# Patient Record
Sex: Female | Born: 1965 | Race: White | Hispanic: No | Marital: Married | Smoking: Never smoker
Health system: Southern US, Community
[De-identification: ages and names within clinical notes are randomized; demographics above are authoritative.]

## PROBLEM LIST (undated history)

## (undated) DIAGNOSIS — E119 Type 2 diabetes mellitus without complications: Secondary | ICD-10-CM

## (undated) DIAGNOSIS — C801 Malignant (primary) neoplasm, unspecified: Secondary | ICD-10-CM

## (undated) DIAGNOSIS — M1990 Unspecified osteoarthritis, unspecified site: Secondary | ICD-10-CM

## (undated) DIAGNOSIS — R202 Paresthesia of skin: Secondary | ICD-10-CM

## (undated) DIAGNOSIS — I10 Essential (primary) hypertension: Secondary | ICD-10-CM

## (undated) DIAGNOSIS — F419 Anxiety disorder, unspecified: Secondary | ICD-10-CM

## (undated) DIAGNOSIS — J45909 Unspecified asthma, uncomplicated: Secondary | ICD-10-CM

## (undated) DIAGNOSIS — K5900 Constipation, unspecified: Secondary | ICD-10-CM

## (undated) DIAGNOSIS — N838 Other noninflammatory disorders of ovary, fallopian tube and broad ligament: Secondary | ICD-10-CM

## (undated) DIAGNOSIS — N9489 Other specified conditions associated with female genital organs and menstrual cycle: Secondary | ICD-10-CM

## (undated) DIAGNOSIS — Z8489 Family history of other specified conditions: Secondary | ICD-10-CM

## (undated) DIAGNOSIS — R590 Localized enlarged lymph nodes: Secondary | ICD-10-CM

## (undated) DIAGNOSIS — N390 Urinary tract infection, site not specified: Secondary | ICD-10-CM

## (undated) DIAGNOSIS — F329 Major depressive disorder, single episode, unspecified: Secondary | ICD-10-CM

## (undated) DIAGNOSIS — R200 Anesthesia of skin: Secondary | ICD-10-CM

## (undated) DIAGNOSIS — G4730 Sleep apnea, unspecified: Secondary | ICD-10-CM

## (undated) DIAGNOSIS — F32A Depression, unspecified: Secondary | ICD-10-CM

## (undated) HISTORY — PX: CARDIAC CATHETERIZATION: SHX172

## (undated) HISTORY — DX: Essential (primary) hypertension: I10

## (undated) HISTORY — DX: Unspecified osteoarthritis, unspecified site: M19.90

## (undated) HISTORY — DX: Localized enlarged lymph nodes: R59.0

## (undated) HISTORY — DX: Other noninflammatory disorders of ovary, fallopian tube and broad ligament: N83.8

## (undated) HISTORY — DX: Type 2 diabetes mellitus without complications: E11.9

## (undated) HISTORY — PX: TONSILLECTOMY: SUR1361

## (undated) HISTORY — DX: Unspecified asthma, uncomplicated: J45.909

## (undated) HISTORY — DX: Depression, unspecified: F32.A

## (undated) HISTORY — DX: Major depressive disorder, single episode, unspecified: F32.9

## (undated) HISTORY — PX: TUBAL LIGATION: SHX77

---

## 2015-07-28 ENCOUNTER — Encounter: Payer: Self-pay | Admitting: Gynecologic Oncology

## 2015-07-28 ENCOUNTER — Ambulatory Visit: Payer: BLUE CROSS/BLUE SHIELD | Attending: Gynecologic Oncology | Admitting: Gynecologic Oncology

## 2015-07-28 VITALS — BP 157/98 | HR 112 | Temp 98.1°F | Resp 19 | Ht 64.0 in | Wt 258.5 lb

## 2015-07-28 DIAGNOSIS — J4520 Mild intermittent asthma, uncomplicated: Secondary | ICD-10-CM | POA: Diagnosis not present

## 2015-07-28 DIAGNOSIS — N838 Other noninflammatory disorders of ovary, fallopian tube and broad ligament: Secondary | ICD-10-CM

## 2015-07-28 DIAGNOSIS — J45909 Unspecified asthma, uncomplicated: Secondary | ICD-10-CM | POA: Insufficient documentation

## 2015-07-28 DIAGNOSIS — N839 Noninflammatory disorder of ovary, fallopian tube and broad ligament, unspecified: Secondary | ICD-10-CM | POA: Insufficient documentation

## 2015-07-28 DIAGNOSIS — G4733 Obstructive sleep apnea (adult) (pediatric): Secondary | ICD-10-CM

## 2015-07-28 DIAGNOSIS — I10 Essential (primary) hypertension: Secondary | ICD-10-CM | POA: Insufficient documentation

## 2015-07-28 DIAGNOSIS — E119 Type 2 diabetes mellitus without complications: Secondary | ICD-10-CM

## 2015-07-28 DIAGNOSIS — R0602 Shortness of breath: Secondary | ICD-10-CM

## 2015-07-28 DIAGNOSIS — Z6841 Body Mass Index (BMI) 40.0 and over, adult: Secondary | ICD-10-CM | POA: Insufficient documentation

## 2015-07-28 DIAGNOSIS — R591 Generalized enlarged lymph nodes: Secondary | ICD-10-CM

## 2015-07-28 DIAGNOSIS — N83202 Unspecified ovarian cyst, left side: Secondary | ICD-10-CM

## 2015-07-28 DIAGNOSIS — R971 Elevated cancer antigen 125 [CA 125]: Secondary | ICD-10-CM

## 2015-07-28 DIAGNOSIS — N83209 Unspecified ovarian cyst, unspecified side: Secondary | ICD-10-CM

## 2015-07-28 HISTORY — DX: Obstructive sleep apnea (adult) (pediatric): G47.33

## 2015-07-28 HISTORY — DX: Type 2 diabetes mellitus without complications: E11.9

## 2015-07-28 HISTORY — DX: Elevated cancer antigen 125 (CA 125): R97.1

## 2015-07-28 HISTORY — DX: Body Mass Index (BMI) 40.0 and over, adult: Z684

## 2015-07-28 HISTORY — DX: Morbid (severe) obesity due to excess calories: E66.01

## 2015-07-28 HISTORY — DX: Essential (primary) hypertension: I10

## 2015-07-28 HISTORY — DX: Other noninflammatory disorders of ovary, fallopian tube and broad ligament: N83.8

## 2015-07-28 HISTORY — DX: Shortness of breath: R06.02

## 2015-07-28 MED ORDER — TRAMADOL HCL 50 MG PO TABS: 50.0000 mg | ORAL_TABLET | Freq: Four times a day (QID) | ORAL | Status: DC | PRN

## 2015-07-28 NOTE — Progress Notes (Signed)
Consult Note: Gyn-Onc  Consult was requested by Dr. Carlena Bjornstad for the evaluation of Melissa Richards 49 y.o. female with a large left ovarian mass, elevated CA 125 and upper abdominal adenopathy  CC:  Chief Complaint  Patient presents with  . Cystic Ovarian Mass    New Consultation  . Elevated CA125    Assessment/Plan:  Ms. [name]  is a 49 y.o.  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 She has a large left ovarian mass, elevated CA 125 ( 2782) and adenopathy in the portal circulation concerning for metastatic disease. I personally reviewed her films from the CT scan from [name] on 07/17/15.  I discussed my suspicions for malignancy with the patient and her family. I discussed that we should obtain imaging of her chest to rule out stage for, unresectable disease. I discussed that I was concerned that the lesion (4cm) at the portocaval nodal region is concerning unresectable metastatic disease and the nodules incompletely characterized on the CT abdo/pelvis may represent additional unresectable diseasee.  I discussed that the treatment approach for this disease a combination of cytoreductive surgery and chemotherapy. I discussed that sequencing of this can be either with upfront debulking followed by adjuvant chemotherapy sequentially or neoadjuvant chemotherapy followed by an interval cytoreductive attempt, then additional chemotherapy. This latter approach is associated with a reduced perioperative morbidity at the time of surgery. I discussed that the goal of optimal sequencing is to optimise the likelihood that cytoreductive effect can be optimal to less than 1 cm of residual disease, and would not induce morbidity for the patient that would result in a delay of adjuvant chemotherapy. I discussed that it is an individual decision process that takes into account individual patient health, and preference factors, in addition to the apparent  tumor distribution on imaging. I discussed that the overall survival observed in patients is equivalent for both approaches provided that there is an optimal cytoreductive effort at the time of surgery (regardless of the timing of that surgery).  I discussed that surgical debulking efforts are associated with high rates of morbidity and potential need for GI and GU surgery, ostomy formation, blood transfusion, and major wound complications. I discussed that she is at increased risks for these morbidities and complications due to her underlying morbid obesity (BMI 44kg/m2), diabetes mellitus, and obstructive sleep apnea.  The patient strongly prefers for primary surgical effort, understanding that there may not be optimal cytoreduction achieved if the portahepatis node is unresectable. However, given the large size of her left ovarian lesion, and the slightly atypical nature of her disease distribution, confirming histology and removing the large abdominal mass may be of benefit prior to starting chemotherapy. We will review her chest CT and if significant chest disease is appreciated, will reconsider our sequencing of therapy.  She was offered surgery (ex lap, TAH, BSO, omentectomy, lymphadenectomy, cytoreductive surgery) in mid November at [facility] but has elected for a surgical date sooner at [facility] with Dr Cindie Laroche. I discussed with the patient that he will consider her case and determine if primary surgery is appropriate in his estimation.   HPI: Melissa Richards is a 49 year old G2P2 who is seen in consultation at the request of Dr Carlena Bjornstad for a large left ovarian mass and elevated CA 125.  The patient reports a 2 month history of increasing left sided abdominal discomfort and constipation.  She presented to the [facility] ER on 07/17/15 with  symptoms of pelvic swelling, left lower back and abdominal pain. A CT of the abdomen and pelvis on 07/17/15 showed Bibasilar  nodlarity , a subcarinal 1.4cm node suggestive of infrahilar adenopathy, multiple small retroperitoneal nodes (none pathologic by size criteria) including a 84mm aortocaval node and a 1.5cm gastrohepatic ligament node. There is a portacaval node measuring 2.5 x 3.7cm (image/series 21/2) and a 1.2cm right external iliac node.  There is soft tissue fullness extending superiorally off the right ovary (measuring 6x10cm) and a dominant left ovarian cystic mass arising from the pelvis measuring 18 x 22.5cm.  A CA 125 was drawn on 1019/16 and was elevated at 2782. (CEA was normal at 0.4).  She denies GI symptoms. She has never had a colonoscopy or EGD.  The patient has no family history concerning for hereditary cancer predisposition.  She has a medical history significant for morbid obesity, OSA, asthma and non-cardiac shortness of breath. She underwent coronary catheterization 2 years ago for this, which was negative for blockage, and she did not require stenting, or antiplatelet therapy. She was referred to pulmonology who diagnosed her as having asthma. She is a diabetic.   Current Meds:  Outpatient Encounter Prescriptions as of 07/28/2015  Medication Sig  . traMADol (ULTRAM) 50 MG tablet Take 1 tablet (50 mg total) by mouth every 6 (six) hours as needed.   No facility-administered encounter medications on file as of 07/28/2015.    Allergy:  Allergies  Allergen Reactions  . Hydrocodone-Acetaminophen Itching    Social Hx:   Social History   Social History  . Marital Status: Married    Spouse Name: N/A  . Number of Children: N/A  . Years of Education: N/A   Occupational History  . Not on file.   Social History Main Topics  . Smoking status: Never Smoker   . Smokeless tobacco: Never Used  . Alcohol Use: No  . Drug Use: No  . Sexual Activity: Not on file   Other Topics Concern  . Not on file   Social History Narrative  . No narrative on file    Past Surgical Hx: History  reviewed. No pertinent past surgical history.  Past Medical Hx:  Past Medical History  Diagnosis Date  . Arthritis   . Asthma   . Depression   . Diabetes mellitus without complication ([place])   . Hypertension     Past Gynecological History:  SVD x 2 No LMP recorded.  Family Hx:  Family History  Problem Relation Age of Onset  . Anesthesia problems Mother   . Diabetes Mother   . Hypertension Mother   . Cancer Father   . Diabetes Brother     Review of Systems:  Constitutional  Feels generally uncomfortable,    ENT Normal appearing ears and nares bilaterally Skin/Breast  No rash, sores, jaundice, itching, dryness Cardiovascular  No chest pain, shortness of breath, or edema  Pulmonary  No cough or wheeze.  Gastro Intestinal  No nausea, vomitting, or diarrhoea. No bright red blood per rectum,+ abdominal pain, + change in bowel movement, + constipation.  Genito Urinary  No frequency, urgency, dysuria, no post menopausal bleeding Musculo Skeletal  No myalgia, arthralgia, joint swelling or pain  Neurologic  No weakness, numbness, change in gait,  Psychology  No depression, anxiety, insomnia.   Vitals:  Blood pressure 157/98, pulse 112, temperature 98.1 F (36.7 C), temperature source Oral, resp. rate 19, height 5\' 4"  (1.626 m), weight 258 lb 8 oz (117.255 kg), SpO2  100 %.  Physical Exam: WD in NAD Neck  Supple NROM, without any enlargements.  Lymph Node Survey No cervical supraclavicular or inguinal adenopathy Cardiovascular  Pulse normal rate, regularity and rhythm. S1 and S2 normal.  Lungs  Clear to auscultation bilateraly, without wheezes/crackles/rhonchi. Good air movement.  Skin  No rash/lesions/breakdown  Psychiatry  Alert and oriented to person, place, and time  Abdomen  Normoactive bowel sounds, abdomen soft, non-tender and obese with pannus without evidence of hernia. Large cystic fullness extending into upper abdomen. Back No CVA tenderness Genito  Urinary  Vulva/vagina: Normal external female genitalia.  No lesions. No discharge or bleeding.  Bladder/urethra:  No lesions or masses, well supported bladder  Vagina: grossly normal  Cervix: Normal appearing, no lesions.  Uterus: Small, mobile, no parametrial involvement or nodularity.  Adnexa: unable to appreciate masses - she does have a pelvic-abdominal mass that is higher up in the pelvis (not low on the pelvic floor or in approximation with the distal rectum). Rectal  Good tone, no masses no cul de sac nodularity.  Extremities  No bilateral cyanosis, clubbing or edema.   Donaciano Eva, MD  07/28/2015, 3:41 PM

## 2015-07-28 NOTE — Patient Instructions (Addendum)
Surgery with Dr. Cindie Laroche at [facility] on November 3.  Your pre-operative appt will be on Oct 31 at 10:30am.  We will also call you with the results of your CT scan.  Please call for any questions or concerns.  Tramadol tablets What is this medicine? TRAMADOL (TRA ma dole) is a pain reliever. It is used to treat moderate to severe pain in adults. This medicine may be used for other purposes; ask your health care provider or pharmacist if you have questions. What should I tell my health care provider before I take this medicine? They need to know if you have any of these conditions: -brain tumor -depression -drug abuse or addiction -head injury -if you frequently drink alcohol containing drinks -kidney disease or trouble passing urine -liver disease -lung disease, asthma, or breathing problems -seizures or epilepsy -suicidal thoughts, plans, or attempt; a previous suicide attempt by you or a family member -an unusual or allergic reaction to tramadol, codeine, other medicines, foods, dyes, or preservatives -pregnant or trying to get pregnant -breast-feeding How should I use this medicine? Take this medicine by mouth with a full glass of water. Follow the directions on the prescription label. If the medicine upsets your stomach, take it with food or milk. Do not take more medicine than you are told to take. Talk to your pediatrician regarding the use of this medicine in children. Special care may be needed. Overdosage: If you think you have taken too much of this medicine contact a poison control center or emergency room at once. NOTE: This medicine is only for you. Do not share this medicine with others. What if I miss a dose? If you miss a dose, take it as soon as you can. If it is almost time for your next dose, take only that dose. Do not take double or extra doses. What may interact with this medicine? Do not take this medicine with any of the following medications: -MAOIs like Carbex,  Eldepryl, Marplan, Nardil, and Parnate This medicine may also interact with the following medications: -alcohol or medicines that contain alcohol -antihistamines -benzodiazepines -bupropion -carbamazepine or oxcarbazepine -clozapine -cyclobenzaprine -digoxin -furazolidone -linezolid -medicines for depression, anxiety, or psychotic disturbances -medicines for migraine headache like almotriptan, eletriptan, frovatriptan, naratriptan, rizatriptan, sumatriptan, zolmitriptan -medicines for pain like pentazocine, buprenorphine, butorphanol, meperidine, nalbuphine, and propoxyphene -medicines for sleep -muscle relaxants -naltrexone -phenobarbital -phenothiazines like perphenazine, thioridazine, chlorpromazine, mesoridazine, fluphenazine, prochlorperazine, promazine, and trifluoperazine -procarbazine -warfarin This list may not describe all possible interactions. Give your health care provider a list of all the medicines, herbs, non-prescription drugs, or dietary supplements you use. Also tell them if you smoke, drink alcohol, or use illegal drugs. Some items may interact with your medicine. What should I watch for while using this medicine? Tell your doctor or health care professional if your pain does not go away, if it gets worse, or if you have new or a different type of pain. You may develop tolerance to the medicine. Tolerance means that you will need a higher dose of the medicine for pain relief. Tolerance is normal and is expected if you take this medicine for a long time. Do not suddenly stop taking your medicine because you may develop a severe reaction. Your body becomes used to the medicine. This does NOT mean you are addicted. Addiction is a behavior related to getting and using a drug for a non-medical reason. If you have pain, you have a medical reason to take pain medicine. Your doctor will  tell you how much medicine to take. If your doctor wants you to stop the medicine, the dose  will be slowly lowered over time to avoid any side effects. You may get drowsy or dizzy. Do not drive, use machinery, or do anything that needs mental alertness until you know how this medicine affects you. Do not stand or sit up quickly, especially if you are an older patient. This reduces the risk of dizzy or fainting spells. Alcohol can increase or decrease the effects of this medicine. Avoid alcoholic drinks. You may have constipation. Try to have a bowel movement at least every 2 to 3 days. If you do not have a bowel movement for 3 days, call your doctor or health care professional. Your mouth may get dry. Chewing sugarless gum or sucking hard candy, and drinking plenty of water may help. Contact your doctor if the problem does not go away or is severe. What side effects may I notice from receiving this medicine? Side effects that you should report to your doctor or health care professional as soon as possible: -allergic reactions like skin rash, itching or hives, swelling of the face, lips, or tongue -breathing difficulties, wheezing -confusion -itching -light headedness or fainting spells -redness, blistering, peeling or loosening of the skin, including inside the mouth -seizures Side effects that usually do not require medical attention (report to your doctor or health care professional if they continue or are bothersome): -constipation -dizziness -drowsiness -headache -nausea, vomiting This list may not describe all possible side effects. Call your doctor for medical advice about side effects. You may report side effects to FDA at 1-800-FDA-1088. Where should I keep my medicine? Keep out of the reach of children. This medicine may cause accidental overdose and death if it taken by other adults, children, or pets. Mix any unused medicine with a substance like cat litter or coffee grounds. Then throw the medicine away in a sealed container like a sealed bag or a coffee can with a lid. Do  not use the medicine after the expiration date. Store at room temperature between 15 and 30 degrees C (59 and 86 degrees F). NOTE: This sheet is a summary. It may not cover all possible information. If you have questions about this medicine, talk to your doctor, pharmacist, or health care provider.    2016, Elsevier/Gold Standard. (2013-11-16 15:42:09)

## 2015-07-28 NOTE — Progress Notes (Signed)
New patient consultation today for Cystic Ovarian Mass , attempted to discussed current oral medications with the patient and family. Patient and family are poor historians , unable to verbalize current oral medications. Went over oral medications from Dr C Richardson's referral information . Patient did not know most doses or frequency of current oral medications , when asked the patient just stated yes I'm taking that .

## 2015-07-29 ENCOUNTER — Telehealth: Payer: Self-pay | Admitting: *Deleted

## 2015-07-29 NOTE — Telephone Encounter (Signed)
Patient scheduled for surgery at [facility] with Dr. Clarene Essex on 08/07/15. Patient records faxed to [place] at [phone].

## 2015-07-30 ENCOUNTER — Ambulatory Visit (HOSPITAL_COMMUNITY)
Admission: RE | Admit: 2015-07-30 | Discharge: 2015-07-30 | Disposition: A | Discharge status: Home / Self Care | Payer: BLUE CROSS/BLUE SHIELD | Source: Ambulatory Visit | Attending: Gynecologic Oncology | Admitting: Gynecologic Oncology

## 2015-07-30 ENCOUNTER — Encounter (HOSPITAL_COMMUNITY): Payer: Self-pay

## 2015-07-30 DIAGNOSIS — E049 Nontoxic goiter, unspecified: Secondary | ICD-10-CM | POA: Insufficient documentation

## 2015-07-30 DIAGNOSIS — R590 Localized enlarged lymph nodes: Secondary | ICD-10-CM | POA: Insufficient documentation

## 2015-07-30 DIAGNOSIS — N83209 Unspecified ovarian cyst, unspecified side: Secondary | ICD-10-CM

## 2015-07-30 DIAGNOSIS — C569 Malignant neoplasm of unspecified ovary: Secondary | ICD-10-CM | POA: Diagnosis not present

## 2015-07-30 DIAGNOSIS — R971 Elevated cancer antigen 125 [CA 125]: Secondary | ICD-10-CM | POA: Insufficient documentation

## 2015-07-30 DIAGNOSIS — R918 Other nonspecific abnormal finding of lung field: Secondary | ICD-10-CM | POA: Insufficient documentation

## 2015-07-30 LAB — POCT I-STAT CREATININE: Creatinine, Ser: 0.8 mg/dL (ref 0.44–1.00)

## 2015-07-30 MED ORDER — IOHEXOL 300 MG/ML  SOLN
75.0000 mL | Freq: Once | INTRAMUSCULAR | Status: AC | PRN
  Administered 2015-07-30: 75 mL via INTRAVENOUS

## 2015-07-30 NOTE — Telephone Encounter (Signed)
Copy of chest CT scan faxed to [facility] at [facility].

## 2015-07-31 ENCOUNTER — Telehealth: Payer: Self-pay | Admitting: Gynecologic Oncology

## 2015-07-31 NOTE — Telephone Encounter (Signed)
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  It is unlikely that Dr Clarene Essex will proceed with primary debulking. I am awaiting his review of the CT chest to confirm his plan.  If he agrees that neoadjuvant chemotherapy is best, we will arrange for an [name] or CT guided biopsy of the left subclavicular node and an appointment with Dr Marko Plume to commence carboplatin and paclitaxel chemotherapy x 3 cycles.  Donaciano Eva, MD

## 2015-08-01 ENCOUNTER — Telehealth: Payer: Self-pay | Admitting: Gynecologic Oncology

## 2015-08-01 ENCOUNTER — Telehealth: Payer: Self-pay | Admitting: *Deleted

## 2015-08-01 DIAGNOSIS — N838 Other noninflammatory disorders of ovary, fallopian tube and broad ligament: Secondary | ICD-10-CM

## 2015-08-01 DIAGNOSIS — R590 Localized enlarged lymph nodes: Secondary | ICD-10-CM

## 2015-08-01 NOTE — Telephone Encounter (Signed)
Called and spoke with patient about Dr. Serita Grit recommendations.  Advised that [name] had consulted with Dr. Clarene Essex at [facility] and they feel she needs to begin with neoadjuvant chemotherapy.  Patient verbalizing understanding.  Advised that she would be set up to have an ultrasound guided biopsy of the left supraclavicular lymph node to confirm the diagnosis and she would also be set up to meet with Dr. Marko Plume.  Message left for Katharine Look at [place] her that surgery would need to be cancelled at this time at [facility].  Patient advised that she would be receiving a phone call our office and the hospital about her biopsy.  Advised to call for any questions or concerns.

## 2015-08-01 NOTE — Telephone Encounter (Signed)
Called patient to notify her that the US biopsy is not yet scheduled. Told patient that we will follow-up with her on Monday, 08/04/15. No other concerns noted at this time.

## 2015-08-04 ENCOUNTER — Other Ambulatory Visit: Payer: Self-pay | Admitting: Radiology

## 2015-08-05 ENCOUNTER — Encounter (HOSPITAL_COMMUNITY): Payer: Self-pay

## 2015-08-05 ENCOUNTER — Ambulatory Visit (HOSPITAL_COMMUNITY)
Admission: RE | Admit: 2015-08-05 | Discharge: 2015-08-05 | Disposition: A | Discharge status: Home / Self Care | Payer: BLUE CROSS/BLUE SHIELD | Source: Ambulatory Visit | Attending: Gynecologic Oncology | Admitting: Gynecologic Oncology

## 2015-08-05 ENCOUNTER — Telehealth: Payer: Self-pay

## 2015-08-05 ENCOUNTER — Telehealth: Payer: Self-pay | Admitting: *Deleted

## 2015-08-05 DIAGNOSIS — G4733 Obstructive sleep apnea (adult) (pediatric): Secondary | ICD-10-CM | POA: Insufficient documentation

## 2015-08-05 DIAGNOSIS — E119 Type 2 diabetes mellitus without complications: Secondary | ICD-10-CM | POA: Insufficient documentation

## 2015-08-05 DIAGNOSIS — R590 Localized enlarged lymph nodes: Secondary | ICD-10-CM | POA: Insufficient documentation

## 2015-08-05 DIAGNOSIS — Z8249 Family history of ischemic heart disease and other diseases of the circulatory system: Secondary | ICD-10-CM | POA: Insufficient documentation

## 2015-08-05 DIAGNOSIS — J45909 Unspecified asthma, uncomplicated: Secondary | ICD-10-CM | POA: Insufficient documentation

## 2015-08-05 DIAGNOSIS — Z79899 Other long term (current) drug therapy: Secondary | ICD-10-CM | POA: Insufficient documentation

## 2015-08-05 DIAGNOSIS — R971 Elevated cancer antigen 125 [CA 125]: Secondary | ICD-10-CM | POA: Insufficient documentation

## 2015-08-05 DIAGNOSIS — Z7984 Long term (current) use of oral hypoglycemic drugs: Secondary | ICD-10-CM | POA: Diagnosis not present

## 2015-08-05 DIAGNOSIS — K5900 Constipation, unspecified: Secondary | ICD-10-CM | POA: Insufficient documentation

## 2015-08-05 DIAGNOSIS — Z833 Family history of diabetes mellitus: Secondary | ICD-10-CM | POA: Diagnosis not present

## 2015-08-05 DIAGNOSIS — I10 Essential (primary) hypertension: Secondary | ICD-10-CM | POA: Insufficient documentation

## 2015-08-05 DIAGNOSIS — N839 Noninflammatory disorder of ovary, fallopian tube and broad ligament, unspecified: Secondary | ICD-10-CM | POA: Insufficient documentation

## 2015-08-05 DIAGNOSIS — N838 Other noninflammatory disorders of ovary, fallopian tube and broad ligament: Secondary | ICD-10-CM | POA: Insufficient documentation

## 2015-08-05 DIAGNOSIS — F329 Major depressive disorder, single episode, unspecified: Secondary | ICD-10-CM | POA: Insufficient documentation

## 2015-08-05 LAB — CBC
HCT: 38.1 % (ref 36.0–46.0)
Hemoglobin: 12.1 g/dL (ref 12.0–15.0)
MCH: 26.8 pg (ref 26.0–34.0)
MCHC: 31.8 g/dL (ref 30.0–36.0)
MCV: 84.3 fL (ref 78.0–100.0)
Platelets: 349 10*3/uL (ref 150–400)
RBC: 4.52 MIL/uL (ref 3.87–5.11)
RDW: 13.4 % (ref 11.5–15.5)
WBC: 10.9 10*3/uL — ABNORMAL HIGH (ref 4.0–10.5)

## 2015-08-05 LAB — GLUCOSE, CAPILLARY: Glucose-Capillary: 125 mg/dL — ABNORMAL HIGH (ref 65–99)

## 2015-08-05 LAB — APTT: aPTT: 29 seconds (ref 24–37)

## 2015-08-05 LAB — PROTIME-INR
INR: 1.09 (ref 0.00–1.49)
Prothrombin Time: 14.3 seconds (ref 11.6–15.2)

## 2015-08-05 MED ORDER — SODIUM CHLORIDE 0.9 % IV SOLN: INTRAVENOUS | Status: DC

## 2015-08-05 MED ORDER — FENTANYL CITRATE (PF) 100 MCG/2ML IJ SOLN
INTRAMUSCULAR | Status: AC | PRN
  Administered 2015-08-05: 50 ug via INTRAVENOUS
  Administered 2015-08-05 (×2): 25 ug via INTRAVENOUS

## 2015-08-05 MED ORDER — FLUMAZENIL 0.5 MG/5ML IV SOLN
INTRAVENOUS | Status: AC
  Filled 2015-08-05: qty 5

## 2015-08-05 MED ORDER — NALOXONE HCL 0.4 MG/ML IJ SOLN
INTRAMUSCULAR | Status: AC
  Filled 2015-08-05: qty 1

## 2015-08-05 MED ORDER — FENTANYL CITRATE (PF) 100 MCG/2ML IJ SOLN
INTRAMUSCULAR | Status: AC
  Filled 2015-08-05: qty 2

## 2015-08-05 MED ORDER — MIDAZOLAM HCL 2 MG/2ML IJ SOLN
INTRAMUSCULAR | Status: AC | PRN
  Administered 2015-08-05: 1 mg via INTRAVENOUS
  Administered 2015-08-05 (×2): 0.5 mg via INTRAVENOUS

## 2015-08-05 MED ORDER — MIDAZOLAM HCL 2 MG/2ML IJ SOLN
INTRAMUSCULAR | Status: AC
  Filled 2015-08-05: qty 4

## 2015-08-05 NOTE — Telephone Encounter (Signed)
Incoming call , patient states she is having problems sleeping and requested some thing for sleep. Melissa Cross , APNP updated, review of medications indicated the patient is currently taking Trazodone 50 MG PO every night for sleep. Orders received to have the patient contact her PCP and request a possible increase in the dose of her Trazodone . Patient agreed with NP recommendations , patient denies further questions at this time .

## 2015-08-05 NOTE — Telephone Encounter (Signed)
Message left on patient's Voicemail with appointment details.

## 2015-08-05 NOTE — Procedures (Signed)
[name] FNA L supraclav LAN 07M x3 No complication No blood loss. See complete dictation in [facility].

## 2015-08-05 NOTE — Discharge Instructions (Signed)
Needle Biopsy, Care After Refer to this sheet in the next few weeks. These instructions provide you with information about caring for yourself after your procedure. Your health care provider may also give you more specific instructions. Your treatment has been planned according to current medical practices, but problems sometimes occur. Call your health care provider if you have any problems or questions after your procedure. WHAT TO EXPECT AFTER THE PROCEDURE After your procedure, it is common to have soreness, bruising, or mild pain at the biopsy site. This should go away in a few days. HOME CARE INSTRUCTIONS  Rest as directed by your health care provider.  Take medicines only as directed by your health care provider.  There are many different ways to close and cover the biopsy site, including stitches (sutures), skin glue, and adhesive strips. Follow your health care provider's instructions about:  Biopsy site care.  Bandage (dressing) changes and removal.  Biopsy site closure removal.  Check your biopsy site every day for signs of infection. Watch for:  Redness, swelling, or pain.  Fluid, blood, or pus. SEEK MEDICAL CARE IF:  You have a fever.  You have redness, swelling, or pain at the biopsy site that lasts longer than a few days.  You have fluid, blood, or pus coming from the biopsy site.  You feel nauseous.  You vomit. SEEK IMMEDIATE MEDICAL CARE IF:  You have shortness of breath.  You have trouble breathing.  You have chest pain.   You feel dizzy or you faint.  You have bleeding that does not stop with pressure or a bandage.  You cough up blood.  You have pain in your abdomen.   This information is not intended to replace advice given to you by your health care provider. Make sure you discuss any questions you have with your health care provider.   Document Released: 02/04/2015 Document Reviewed: 02/04/2015 Elsevier Interactive Patient Education 2016  Elsevier Inc. Moderate Conscious Sedation, Adult Sedation is the use of medicines to promote relaxation and relieve discomfort and anxiety. Moderate conscious sedation is a type of sedation. Under moderate conscious sedation you are less alert than normal but are still able to respond to instructions or stimulation. Moderate conscious sedation is used during short medical and dental procedures. It is milder than deep sedation or general anesthesia and allows you to return to your regular activities sooner. LET [facility] CARE PROVIDER KNOW ABOUT:   Any allergies you have.  All medicines you are taking, including vitamins, herbs, eye drops, creams, and over-the-counter medicines.  Use of steroids (by mouth or creams).  Previous problems you or members of your family have had with the use of anesthetics.  Any blood disorders you have.  Previous surgeries you have had.  Medical conditions you have.  Possibility of pregnancy, if this applies.  Use of cigarettes, alcohol, or illegal drugs. RISKS AND COMPLICATIONS Generally, this is a safe procedure. However, as with any procedure, problems can occur. Possible problems include:  Oversedation.  Trouble breathing on your own. You may need to have a breathing tube until you are awake and breathing on your own.  Allergic reaction to any of the medicines used for the procedure. BEFORE THE PROCEDURE  You may have blood tests done. These tests can help show how well your kidneys and liver are working. They can also show how well your blood clots.  A physical exam will be done.  Only take medicines as directed by your health care provider.  You may need to stop taking medicines (such as blood thinners, aspirin, or nonsteroidal anti-inflammatory drugs) before the procedure.   Do not eat or drink at least 6 hours before the procedure or as directed by your health care provider.  Arrange for a responsible adult, family member, or friend to  take you home after the procedure. He or she should stay with you for at least 24 hours after the procedure, until the medicine has worn off. PROCEDURE   An intravenous (IV) catheter will be inserted into one of your veins. Medicine will be able to flow directly into your body through this catheter. You may be given medicine through this tube to help prevent pain and help you relax.  The medical or dental procedure will be done. AFTER THE PROCEDURE  You will stay in a recovery area until the medicine has worn off. Your blood pressure and pulse will be checked.   Depending on the procedure you had, you may be allowed to go home when you can tolerate liquids and your pain is under control.   This information is not intended to replace advice given to you by your health care provider. Make sure you discuss any questions you have with your health care provider.   Document Released: 06/15/2001 Document Revised: 10/11/2014 Document Reviewed: 05/28/2013 Elsevier Interactive Patient Education [employer].

## 2015-08-05 NOTE — H&P (Signed)
Chief Complaint: Patient was seen in consultation today for US guided left supraclavicular lymph node biopsy  Referring Physician(s): Cross,Melissa D/Rossi  History of Present Illness: Melissa Richards is a 49 y.o. female with past medical history significant for morbid obesity, obstructive sleep apnea, asthma, diabetes, hypertension, an approximately 2 month history of increasing left-sided abdominal discomfort and constipation. Subsequent imaging studies have revealed findings concerning for metastatic ovarian cancer with a large left ovarian mass with associated adenopathy and bilateral small pulmonary nodules. Patient's CA 125 is 2782. She presents today for ultrasound-guided left supra clavicular lymph node biopsy for further evaluation.  Past Medical History  Diagnosis Date  . Arthritis   . Asthma   . Depression   . Diabetes mellitus without complication ([place])   . Hypertension     Past Surgical History  Procedure Laterality Date  . Tubal ligation    . Tonsillectomy    . Cardiac catheterization      2 years ago    Allergies: Hydrocodone-acetaminophen  Medications: Prior to Admission medications   Medication Sig Start Date End Date Taking? Authorizing Provider  AMLODIPINE BESYLATE PO Take by mouth.   Yes Historical Provider, MD  atenolol (TENORMIN) 25 MG tablet Take 25 mg by mouth daily.   Yes Historical Provider, MD  budesonide-formoterol (SYMBICORT) 160-4.5 MCG/ACT inhaler Inhale 2 puffs into the lungs 2 (two) times daily.   Yes Historical Provider, MD  busPIRone (BUSPAR) 15 MG tablet Take 15 mg by mouth 2 (two) times daily.   Yes Historical Provider, MD  citalopram (CELEXA) 40 MG tablet Take 40 mg by mouth daily.   Yes Historical Provider, MD  lisinopril (PRINIVIL,ZESTRIL) 20 MG tablet Take 20 mg by mouth daily.   Yes Historical Provider, MD  metFORMIN (GLUCOPHAGE) 500 MG tablet Take by mouth daily with breakfast.   Yes Historical Provider, MD  traMADol (ULTRAM) 50  MG tablet Take 1 tablet (50 mg total) by mouth every 6 (six) hours as needed. 07/28/15  Yes [name], MD  traZODone (DESYREL) 50 MG tablet Take 50 mg by mouth at bedtime.   Yes Historical Provider, MD  atorvastatin (LIPITOR) 40 MG tablet Take 40 mg by mouth daily.    Historical Provider, MD     Family History  Problem Relation Age of Onset  . Anesthesia problems Mother   . Diabetes Mother   . Hypertension Mother   . Cancer Father   . Diabetes Brother     Social History   Social History  . Marital Status: Married    Spouse Name: N/A  . Number of Children: N/A  . Years of Education: N/A   Social History Main Topics  . Smoking status: Never Smoker   . Smokeless tobacco: Never Used  . Alcohol Use: No  . Drug Use: No  . Sexual Activity: Not Asked   Other Topics Concern  . None   Social History Narrative     Review of Systems  Constitutional: Negative for fever and chills.  Respiratory: Positive for shortness of breath. Negative for cough.   Cardiovascular: Negative for chest pain.  Gastrointestinal: Positive for abdominal pain and abdominal distention. Negative for vomiting and blood in stool.       Occasional nausea  Genitourinary: Negative for dysuria and hematuria.  Musculoskeletal: Positive for back pain.  Neurological: Negative for headaches.  Psychiatric/Behavioral: The patient is nervous/anxious.     Vital Signs: BP 120/85 mmHg  Pulse 87  Temp(Src) 98.8 F (37.1 C) (Oral)  Resp 18  Ht 5\' 2"  (1.575 m)  Wt 254 lb (115.214 kg)  BMI 46.45 kg/m2  SpO2 97%  Physical Exam  Constitutional: She is oriented to person, place, and time. She appears well-developed and well-nourished.  Cardiovascular: Normal rate and regular rhythm.   Pulmonary/Chest: Effort normal and breath sounds normal.  Abdominal: Soft. Bowel sounds are normal. She exhibits distension. There is tenderness.  Obese  Musculoskeletal: Normal range of motion. She exhibits no edema.    Neurological: She is alert and oriented to person, place, and time.    Mallampati Score:     Imaging: Ct Chest W Contrast  07/30/2015  CLINICAL DATA:  Staging for newly diagnosed ovarian carcinoma. Elevated CA 125 marker. Initial encounter EXAM: CT CHEST WITH CONTRAST TECHNIQUE: Multidetector CT imaging of the chest was performed during intravenous contrast administration. CONTRAST:  51mL OMNIPAQUE IOHEXOL 300 MG/ML  SOLN COMPARISON:  CT abdomen pelvis 07/21/2015 FINDINGS: Mediastinum/Nodes: 9 mm LEFT supraclavicular lymph node (image 6, series 2). This lymph node is best depicted on coronal image 63. There is enlargement of the LEFT lobe of thyroid gland 231 x 35 mm with coarse internal calcifications. Enlarged RIGHT lower paratracheal lymph node measures 16 mm short axis. Enlarged RIGHT hilar lymph node measures 17 mm. A prominent LEFT hilar lymph nodes measure up to 9 mm. No central pulmonary embolism. Lungs/Pleura: Review of the lung parenchyma demonstrates bilateral small pulmonary nodules with indistinct margins. Example nodules RIGHT lower lobe measures 5 mm on image 33, series 5. RIGHT upper lobe nodule measures 4 mm image 17 series 5 RIGHT middle lobe nodule measures 5 mm image 21 Several nodules are dispersed along the horizontal oblique fissure (image 28) Within the LEFT lower lobe 5 mm nodule image 30 5 mm nodule within the lingula on image 26. Upper abdomen: Limited view of the liver, kidneys, pancreas are unremarkable. Normal adrenal glands. Musculoskeletal: No aggressive osseous lesion. IMPRESSION: 1. Bilateral small pulmonary nodules are concerning for pulmonary metastasis. 2. Mild mediastinal and bilateral hilar adenopathy is also concerning for metastatic disease. 3. Mildly enlarged LEFT supraclavicular lymph node is concerning for metastatic disease. This lymph node may be amenable to ultrasound-guided biopsy. 4. Enlargement of the LEFT lobe of thyroid gland likely represents benign  goiter. Electronically Signed   By: Suzy Bouchard M.D.   On: 07/30/2015 14:14    Labs:  CBC:  Recent Labs  08/05/15 0840  WBC 10.9*  HGB 12.1  HCT 38.1  PLT 349    COAGS:  Recent Labs  08/05/15 0840  INR 1.09  APTT 29    BMP:  Recent Labs  07/30/15 1241  CREATININE 0.80    LIVER FUNCTION TESTS: No results for input(s): BILITOT, AST, ALT, ALKPHOS, PROT, ALBUMIN in the last 8760 hours.  TUMOR MARKERS: No results for input(s): AFPTM, CEA, CA199, CHROMGRNA in the last 8760 hours.  Assessment and Plan: Patient with approximately 2 month history of abdominal pain, constipation, elevated CA-125 and imaging findings concerning for metastatic ovarian carcinoma. She presents today for ultrasound-guided left supraclavicular lymph node biopsy for further evaluation.Risks and benefits discussed with the patient/husband including, but not limited to bleeding, infection, damage to adjacent structures or low yield requiring additional tests.All of the patient's questions were answered, patient is agreeable to proceed.Consent signed and in chart.     Thank you for this interesting consult.  I greatly enjoyed meeting Melissa Richards and look forward to participating in their care.  A copy of this report was sent  to the requesting provider on this date.  Signed: D. Rowe Robert 08/05/2015, 9:05 AM   I spent a total of 15 minutes in face to face in clinical consultation, greater than 50% of which was counseling/coordinating care for ultrasound-guided left supraclavicular lymph node biopsy

## 2015-08-07 ENCOUNTER — Other Ambulatory Visit (HOSPITAL_BASED_OUTPATIENT_CLINIC_OR_DEPARTMENT_OTHER): Payer: BLUE CROSS/BLUE SHIELD

## 2015-08-07 ENCOUNTER — Ambulatory Visit (HOSPITAL_COMMUNITY): Payer: BLUE CROSS/BLUE SHIELD

## 2015-08-07 ENCOUNTER — Encounter: Payer: Self-pay | Admitting: *Deleted

## 2015-08-07 ENCOUNTER — Other Ambulatory Visit: Payer: Self-pay | Admitting: Oncology

## 2015-08-07 ENCOUNTER — Other Ambulatory Visit: Payer: BLUE CROSS/BLUE SHIELD

## 2015-08-07 DIAGNOSIS — E119 Type 2 diabetes mellitus without complications: Secondary | ICD-10-CM | POA: Diagnosis not present

## 2015-08-07 DIAGNOSIS — R971 Elevated cancer antigen 125 [CA 125]: Secondary | ICD-10-CM | POA: Diagnosis not present

## 2015-08-07 DIAGNOSIS — N839 Noninflammatory disorder of ovary, fallopian tube and broad ligament, unspecified: Secondary | ICD-10-CM

## 2015-08-07 DIAGNOSIS — N838 Other noninflammatory disorders of ovary, fallopian tube and broad ligament: Secondary | ICD-10-CM

## 2015-08-07 LAB — CBC WITH DIFFERENTIAL/PLATELET
BASO%: 1 % (ref 0.0–2.0)
Basophils Absolute: 0.1 10*3/uL (ref 0.0–0.1)
EOS ABS: 0.2 10*3/uL (ref 0.0–0.5)
EOS%: 1.6 % (ref 0.0–7.0)
HEMATOCRIT: 38.4 % (ref 34.8–46.6)
HGB: 12.3 g/dL (ref 11.6–15.9)
LYMPH#: 0.7 10*3/uL — AB (ref 0.9–3.3)
LYMPH%: 6.2 % — AB (ref 14.0–49.7)
MCH: 26 pg (ref 25.1–34.0)
MCHC: 32 g/dL (ref 31.5–36.0)
MCV: 81.1 fL (ref 79.5–101.0)
MONO#: 0.8 10*3/uL (ref 0.1–0.9)
MONO%: 7.5 % (ref 0.0–14.0)
NEUT#: 9.4 10*3/uL — ABNORMAL HIGH (ref 1.5–6.5)
NEUT%: 83.7 % — AB (ref 38.4–76.8)
PLATELETS: 411 10*3/uL — AB (ref 145–400)
RBC: 4.74 10*6/uL (ref 3.70–5.45)
RDW: 13.8 % (ref 11.2–14.5)
WBC: 11.2 10*3/uL — ABNORMAL HIGH (ref 3.9–10.3)

## 2015-08-07 LAB — COMPREHENSIVE METABOLIC PANEL (CC13)
ALT: 13 U/L (ref 0–55)
ANION GAP: 10 meq/L (ref 3–11)
AST: 10 U/L (ref 5–34)
Albumin: 3.1 g/dL — ABNORMAL LOW (ref 3.5–5.0)
Alkaline Phosphatase: 90 U/L (ref 40–150)
BUN: 8.5 mg/dL (ref 7.0–26.0)
CALCIUM: 9.8 mg/dL (ref 8.4–10.4)
CHLORIDE: 99 meq/L (ref 98–109)
CO2: 25 meq/L (ref 22–29)
CREATININE: 1 mg/dL (ref 0.6–1.1)
EGFR: 67 mL/min/{1.73_m2} — AB (ref >90)
Glucose: 183 mg/dl — ABNORMAL HIGH (ref 70–140)
Potassium: 4.3 mEq/L (ref 3.5–5.1)
Sodium: 135 mEq/L — ABNORMAL LOW (ref 136–145)
Total Bilirubin: 0.68 mg/dL (ref 0.20–1.20)
Total Protein: 7 g/dL (ref 6.4–8.3)

## 2015-08-08 ENCOUNTER — Telehealth: Payer: Self-pay | Admitting: Gynecologic Oncology

## 2015-08-08 ENCOUNTER — Other Ambulatory Visit: Payer: Self-pay | Admitting: Gynecologic Oncology

## 2015-08-08 DIAGNOSIS — N838 Other noninflammatory disorders of ovary, fallopian tube and broad ligament: Secondary | ICD-10-CM

## 2015-08-08 DIAGNOSIS — R971 Elevated cancer antigen 125 [CA 125]: Secondary | ICD-10-CM

## 2015-08-08 LAB — CA 125: CA 125: 1784 U/mL — AB (ref <35)

## 2015-08-08 NOTE — Telephone Encounter (Addendum)
Error

## 2015-08-08 NOTE — Progress Notes (Signed)
Per [name], probable ovarian ca, need tissue diagnosis confirming ca diagnosis before beginning chemotherapy.  CT biopsy ordered placed to see if the radiologist feels there is another place to biopsy.

## 2015-08-11 ENCOUNTER — Telehealth: Payer: Self-pay | Admitting: Gynecologic Oncology

## 2015-08-11 ENCOUNTER — Other Ambulatory Visit: Payer: Self-pay | Admitting: Oncology

## 2015-08-11 NOTE — Telephone Encounter (Signed)
Left message advising the patient that even though her biopsy is scheduled for Nov 14, she should still come to her appt with Dr. Marko Plume tomorrow per [name] and Dr. Marko Plume.  Advised to call the office for any questions or concerns.

## 2015-08-12 ENCOUNTER — Telehealth: Payer: Self-pay | Admitting: *Deleted

## 2015-08-12 ENCOUNTER — Ambulatory Visit (HOSPITAL_BASED_OUTPATIENT_CLINIC_OR_DEPARTMENT_OTHER): Payer: BLUE CROSS/BLUE SHIELD | Admitting: Oncology

## 2015-08-12 ENCOUNTER — Encounter: Payer: Self-pay | Admitting: Oncology

## 2015-08-12 ENCOUNTER — Telehealth: Payer: Self-pay | Admitting: Oncology

## 2015-08-12 VITALS — BP 136/91 | HR 103 | Temp 97.8°F | Resp 18 | Ht 62.0 in | Wt 254.5 lb

## 2015-08-12 DIAGNOSIS — G4733 Obstructive sleep apnea (adult) (pediatric): Secondary | ICD-10-CM

## 2015-08-12 DIAGNOSIS — J4520 Mild intermittent asthma, uncomplicated: Secondary | ICD-10-CM

## 2015-08-12 DIAGNOSIS — C562 Malignant neoplasm of left ovary: Secondary | ICD-10-CM

## 2015-08-12 DIAGNOSIS — J45909 Unspecified asthma, uncomplicated: Secondary | ICD-10-CM

## 2015-08-12 DIAGNOSIS — Z23 Encounter for immunization: Secondary | ICD-10-CM | POA: Diagnosis not present

## 2015-08-12 DIAGNOSIS — Z6841 Body Mass Index (BMI) 40.0 and over, adult: Secondary | ICD-10-CM

## 2015-08-12 DIAGNOSIS — C801 Malignant (primary) neoplasm, unspecified: Secondary | ICD-10-CM | POA: Diagnosis not present

## 2015-08-12 DIAGNOSIS — N838 Other noninflammatory disorders of ovary, fallopian tube and broad ligament: Secondary | ICD-10-CM

## 2015-08-12 DIAGNOSIS — N839 Noninflammatory disorder of ovary, fallopian tube and broad ligament, unspecified: Secondary | ICD-10-CM | POA: Diagnosis not present

## 2015-08-12 DIAGNOSIS — E119 Type 2 diabetes mellitus without complications: Secondary | ICD-10-CM

## 2015-08-12 HISTORY — DX: Body Mass Index (BMI) 40.0 and over, adult: Z684

## 2015-08-12 HISTORY — DX: Mild intermittent asthma, uncomplicated: J45.20

## 2015-08-12 MED ORDER — LORAZEPAM 1 MG PO TABS: ORAL_TABLET | ORAL | Status: DC

## 2015-08-12 MED ORDER — DEXAMETHASONE 4 MG PO TABS: ORAL_TABLET | ORAL | Status: DC

## 2015-08-12 MED ORDER — ONDANSETRON HCL 8 MG PO TABS: 8.0000 mg | ORAL_TABLET | Freq: Three times a day (TID) | ORAL | Status: DC | PRN

## 2015-08-12 MED ORDER — INFLUENZA VAC SPLIT QUAD 0.5 ML IM SUSY
0.5000 mL | PREFILLED_SYRINGE | Freq: Once | INTRAMUSCULAR | Status: AC
  Administered 2015-08-12: 0.5 mL via INTRAMUSCULAR
  Filled 2015-08-12: qty 0.5

## 2015-08-12 NOTE — Progress Notes (Signed)
[place] NEW PATIENT EVALUATION   Name: Melissa Richards Date: August 12, 2015  MRN: [id number] DOB: 18-Jan-1966  REFERRING PHYSICIAN: [name] / Almyra Deforest, Marshall Cork., MD,  Ellouise Newer, Tanvir Chodri [phone])  REASON FOR REFERRAL: new diagnosis ovarian cancer, for neoadjuvant chemotherapy  Information is from [facility] and this EMR, and has been reviewed in detail with patient and daughter now, with their history additionally.  HISTORY OF PRESENT ILLNESS:Melissa Richards is a 49 y.o. female who is seen in consultation, together with daughter, at the request of Drs Denman George and Clarene Essex , for consideration of neoadjuvant chemotherapy for new clinical diagnosis of ovarian cancer, which may be stage IV disease.    Patient has had intermittent abdominal discomfort x months, with more recent increased constipation and left abdominal discomfort. Symptoms did not improve with miralax, such that she had CT AP at [facility] 07-17-15. The scan showed bibasilar pulmonary nodules; subcarinal node 1.4 cm and question of right infrahilar adenopathy; multiple small retroperitoneal nodes including 7 mm aortocaval, 1.5 cm gastrohepatic, 2.5 x 3.7 portacaval node and 1.2 cm right iliac node; complex cystic mass from left ovary 18.3 x 22.5 x 20.5 cm and right ovarian mass 6x9.6 x 10 cm; small ascites; uterus displaced to left but otherwise normal; some omental thickening. She was seen by Dr Marvel Plan, with CA 125 on 07-23-15 resulting 2782 and CEA 0.4. She was referred to Dr Denman George 07-28-15, with plan initially to go directly to surgery by Dr Clarene Essex at [facility]. She had additional evaluation with CT chest in [facility] system on 07-30-15, with 9 mm left supraclav node, right paratracheal node 16 mm, right hilar node 17 mm, left hilar node 9 mm, bilateral small pulmonary nodules with indistinct margins 4-5 mm, no concerning bony lesions, upper abdomen not remarkable. With CT  findings, plan changed to neoadjuvant chemotherapy rather than initial surgery. She had [name] FNA of left supraclavicular node on 08-05-15 with granulomas in lymphoid tissue, no malignancy. She is scheduled for needle biopsy of right external iliac node on 08-18-15. She attended chemotherapy education class on 08-07-15.  REVIEW OF SYSTEMS: Weight down a little tho abdomen feels more full. Bowels moving "every few days", uses occasional metamucil only. Recent GERD, occasional nausea, occasional vomiting. Abdominal discomfort generally LUQ and left mid abdomen. Bladder ok. No bleeding. No SOB, occasional cough, rarely uses inhalers, does not tolerate CPAP for OSA. No cardiac symptoms. Blood sugars generally ~ 160 tho does not check regularly; is able to check BS at home. Recent mild HA. Good visual acuity with glasses, no sinus symptoms, no difficulty hearing, no dental pain tho broken tooth right upper molar. No known thyroid disease. Symbicort "makes throat sore". On trazadone x >=10 years. On buspar x years. Difficulty sleeping. On oral hypoglycemic only, does not follow diabetic diet well. No chest pain or palpitations. Some arthritis hips and back. No bleeding. No history of blood clots, IV access somewhat difficult.  Remainder of full 10 point review of systems negative.   ALLERGIES: Hydrocodone-acetaminophen causing itching  PAST MEDICAL/ SURGICAL HISTORY:    G2P2 Morbid obesity DM for "a few years" on oral agent Obstructive sleep apnea: evaluated by Dr Alcide Clever with [facility] Asthma Possible pneumonia a year ago with CXR by Dr Alcide Clever then Cardiac cath 2014 negative HTN Mammogram at [facility] within past year Anxiety and depression Never transfused  CURRENT MEDICATIONS: reviewed as listed now in EMR. Flu vaccine done  today  Written and oral instructions: 1.decadron (dexamethasone, steroid) 4 mg. Take five tablets +(=20 mg) with food 12 hrs before taxol  chemotherapy and five tablets with food 6 hrs before taxol   2.zofran (ondansetron) 8mg  One tablet every 8 hrs as needed for nausea. Will not make you drowsy. Fine to take one tablet AM after chemo whether or not any nausea then, to extend coverage for nausea a bit longer. Other than that dose, fine to take just as needed for nausea   3.ativan (lorazepam)  1 mg. 1/2 - 1 tablet swallow or dissolve under tongue every 6 hrs as needed for nausea. WIll make you drowsy and a little forgetful around each dose. Fine to take one tablet at bedtime night of chemo whether or not any nausea. (This will also help anxiety, but best not to take regularly for anxiety.)  Fine to take claritin (lortadine) daily beginning day after chemo for several days, as this may help with taxol aches. OK to use tylenol, aleve with food, heating pad, hot shower, tramadol if taxol aches      PHARMACY  [facility]   SOCIAL HISTORY:  Originally from [place] tho moved to [place] 2 years ago. Lives with husband, daughter with 3 grandsons ages 11-7-4 also in [place] and son in [place]. Patient worked in [employer] (medical), disabled with arthritis and depression. Never smoker. No ETOH.  FAMILY HISTORY:  Mother DM and HTN Father throat ca, smoker Brother DM Daughter lupus [place] crohn's          PHYSICAL EXAM:  height is 5\' 2"  (1.575 m) and weight is 254 lb 8 oz (115.44 kg). Her oral temperature is 97.8 F (36.6 C). Her blood pressure is 136/91 and her pulse is 103. Her respiration is 18 and oxygen saturation is 100%.  Alert, pleasant, cooperative lady, fairly good historian, daughter helpful and supportive. Looks stated age, morbidly obese, respirations not labored RA. Able to get on and off exam table with some assistance. NAD.  HEENT:normal hair pattern. PERRL, not icteric. Oral mucosa moist. Broken upper right molar, no swelling or tenderness. Neck supple, no palpable thyroid mass, no JVD.   RESPIRATORY: lungs clear to A and  P  CARDIAC/ VASCULAR:Heart RRR no gallop, clear heart sounds. Peripheral pulses symmetricall and intact  ABDOMEN: obese, full, few BS, not tender to palpation, no clear organomegaly or mass.  LYMPH NODES:no cervical, supraclavicular, axillary or inguinal adenopathy  BREASTS: bilaterally without dominant mass, skin or nipple findings  NEUROLOGIC: speech fluent and appropriate. CN intact. Motor, sensory, cerebellar nonfocal  SKIN:without rash, ecchymosis, petechiae  MUSCULOSKELETAL: spine not tender. Muscle mass symmetrical. LE no pitting edema, cords, tenderness Veins in UE appear adequate to initiate chemotherapy    LABORATORY DATA:  Labs 08-07-15:  CBC with WBC 11.2, ANC 9.4, Hgb 12.3 and plt 411k CMET with Na 135, K 4.3, CL 99, CO2 25, glu 183, BUN 8.5, creat 1.0, Tbili 0.68, AP 90, AST 10, ALT 13, Tprot 7.0, alb 3.1, ca 9.8, EGFR 67 CA 125   1784  PATHOLOGY: KENNEDI, LIZARDO Collected: 08/05/2015 Client: [facility] Accession: [id number] Received: 08/05/2015 D. Arne Cleveland CYTOPATHOLOGY REPORT Adequacy Reason Satisfactory For Evaluation. Diagnosis LYMPH NODE, FINE NEEDLE ASPIRATION, LEFT SUPRACLAVICULAR (SPECIMEN 1 OF 1 COLLECTED 08/05/15): FOCAL CLUSTERS WITH GRANULOMAS, SEE COMMENT. LYMPHOID TISSUE PRESENT. NO MALIGNANT CELLS IDENTIFIED.  RADIOGRAPHY: EXAM: CT CHEST WITH CONTRAST  07-30-15  TECHNIQUE: Multidetector CT imaging of the chest was performed during intravenous contrast administration.  CONTRAST: 66mL OMNIPAQUE  IOHEXOL 300 MG/ML SOLN  COMPARISON: CT abdomen pelvis 07/21/2015  FINDINGS: Mediastinum/Nodes: 9 mm LEFT supraclavicular lymph node (image 6, series 2). This lymph node is best depicted on coronal image 63. There is enlargement of the LEFT lobe of thyroid gland 231 x 35 mm with coarse internal calcifications.  Enlarged RIGHT lower paratracheal lymph node measures 16 mm short axis. Enlarged RIGHT hilar lymph node measures 17  mm. A prominent LEFT hilar lymph nodes measure up to 9 mm. No central pulmonary embolism.  Lungs/Pleura: Review of the lung parenchyma demonstrates bilateral small pulmonary nodules with indistinct margins.  Example nodules  RIGHT lower lobe measures 5 mm on image 33, series 5.  RIGHT upper lobe nodule measures 4 mm image 17 series 5  RIGHT middle lobe nodule measures 5 mm image 21  Several nodules are dispersed along the horizontal oblique fissure (image 28)  Within the LEFT lower lobe 5 mm nodule image 30  5 mm nodule within the lingula on image 26.  Upper abdomen: Limited view of the liver, kidneys, pancreas are unremarkable. Normal adrenal glands.  Musculoskeletal: No aggressive osseous lesion.  IMPRESSION: 1. Bilateral small pulmonary nodules are concerning for pulmonary metastasis. 2. Mild mediastinal and bilateral hilar adenopathy is also concerning for metastatic disease. 3. Mildly enlarged LEFT supraclavicular lymph node is concerning for metastatic disease. This lymph node may be amenable to ultrasound-guided biopsy. 4. Enlargement of the LEFT lobe of thyroid gland likely represents benign goiter.    DISCUSSION: all of history and evaluation to date reviewed with patient and daughter. We have discussed fact that she may have a diagnosis separate from the clinical ovarian cancer to account for some of the radiographic findings, including discussion of granulomas without malignancy in supraclavicular node. They understand that imaging and the elevated CA 125 is clinically consistent with gyn cancer, and could well account for abdominal and GI symptoms of last several months. We have discussed usefulness of optimal debulking surgery either before chemotherapy or after neoadjuvant chemotherapy. We have discussed outpatient administration of chemotherapy and follow up visits between treatments, use of carboplatin and taxol as standard therapy, possible side  effects of chemotherapy including nausea/ vomiting, drop in blood counts, alopecia, taxol aches, allergic reactions, peripheral neuropathy, elevated blood sugars with steroids. We have discussed antiemetics and premedication decadron. She would like peripheral administration of chemotherapy if possible.  As external iliac node biopsy is planned at least on 08-18-15, we will wait to begin chemotherapy until those results should be available, to allow consideration by gyn oncology and medical oncology prior to start of chemotherapy.  Patient and daughter understand that chemotherapy could be done in [place] if she prefers; coordination with gyn oncology will be very important whether chemo is done in [place] or [place], and they request [facility] for this reason.  Verbal consent given for chemotherapy.    IMPRESSION / PLAN:  1. Imaging consistent with ovarian malignancy, with large left ovarian mass and smaller right ovarian mass, with elevated CA 125 : concern from CT imaging that this could be metastatic, tho left supraclavicular node without malignancy. For IR biopsy of external iliac node 08-18-15. Neoadjuvant chemotherapy seems very reasonable from medical oncology standpoint, tho will be confirmed with gyn oncology after results of the upcoming node biopsy are available.  2.granulomas in left supraclavicular node biopsy, with imaging question of sarcoidosis. Will add ACE level to next labs done here and I will be in touch with Dr Alcide Clever. 3.morbid obesity 4.diabetes on oral agent:  patient is willing to check blood sugars at home over several days following chemotherapy, as we expect that premedication decadron will elevate blood sugars. I have strongly encouraged her to avoid concentrated sweets and to follow diabetic diet especially during that time. 5.HTN, with reportedly unremarkable cardiac cath 2014 6.asthma, which does not require regular medications 7.obstructive sleep apnea: did not  tolerate CPAP 8.never colonoscopy 9.up to date on mammograms 10.anxiety, increased since this diagnosis. Continue regular medications and will use ativan for nausea prn, as this may be helpful for the anxiety additionally. 11.History of goiter 12.Broken tooth: she does have dentist if this becomes a problem 13.Flu vaccine done   Patient and daughter have had questions answered to their satisfaction and are in agreement with plan above. They can contact this office for questions or concerns at any time prior to next scheduled visit. Chemotherapy and neulasta orders placed, notification to managed care. Cc Drs Denman George, Tillman Sers, Chodri Time spent 75 min, including >50% discussion and coordination of care.    Ruby Logiudice P, MD 08/12/2015 3:59 PM

## 2015-08-12 NOTE — Telephone Encounter (Signed)
Appointments made and avs printed for patient,will call her with chemo time-sent to michelle to help

## 2015-08-12 NOTE — Telephone Encounter (Signed)
Per staff message and POF I have scheduled appts. Advised scheduler of appts. JMW  

## 2015-08-12 NOTE — Patient Instructions (Signed)
.  We will send prescriptions to your pharmacy   1.decadron (dexamethasone, steroid) 4 mg. Take five tablets +(=20 mg) with food 12 hrs before taxol chemotherapy and five tablets with food 6 hrs before taxol   2.zofran (ondansetron) 8mg  One tablet every 8 hrs as needed for nausea. Will not make you drowsy. Fine to take one tablet AM after chemo whether or not any nausea then, to extend coverage for nausea a bit longer. Other than that dose, fine to take just as needed for nausea   3.ativan (lorazepam)  1 mg. 1/2 - 1 tablet swallow or dissolve under tongue every 6 hrs as needed for nausea. WIll make you drowsy and a little forgetful around each dose. Fine to take one tablet at bedtime night of chemo whether or not any nausea. (This will also help anxiety, but best not to take regularly for anxiety.)  Fine to take claritin (lortadine) daily beginning day after chemo for several days, as this may help with taxol aches. OK to use tylenol, aleve with food, heating pad, hot shower, tramadol if taxol aches  You can call any time if needed [phone]

## 2015-08-13 ENCOUNTER — Other Ambulatory Visit: Payer: Self-pay | Admitting: Oncology

## 2015-08-13 ENCOUNTER — Telehealth: Payer: Self-pay

## 2015-08-13 ENCOUNTER — Telehealth: Payer: Self-pay | Admitting: Oncology

## 2015-08-13 DIAGNOSIS — C562 Malignant neoplasm of left ovary: Secondary | ICD-10-CM

## 2015-08-13 NOTE — Telephone Encounter (Signed)
Melissa Richards wanted to know if she could use the Zofran or ativan for current nausea or does she need to wait until after chemotherapy. Told Melissa Richards that she could use either medication as directed if needed.  Pt. Verbalized understanding.

## 2015-08-13 NOTE — Telephone Encounter (Signed)
Called and left a message with 11/21 appointments

## 2015-08-14 ENCOUNTER — Other Ambulatory Visit: Payer: Self-pay | Admitting: Radiology

## 2015-08-15 ENCOUNTER — Telehealth: Payer: Self-pay

## 2015-08-15 ENCOUNTER — Encounter: Payer: Self-pay | Admitting: Oncology

## 2015-08-15 ENCOUNTER — Other Ambulatory Visit: Payer: Self-pay | Admitting: Physician Assistant

## 2015-08-15 NOTE — Telephone Encounter (Signed)
Faxed FMLA paperwork for husband to [facility].

## 2015-08-15 NOTE — Progress Notes (Signed)
Called pt to introduce myself as Estate manager/land agent. Asked patient if she had any financial questions or concerns or regarding billing or insurance and if she has met OOP for this year. Pt states she thinks she has met her OOP and does not have any financial questions or concerns at this time. Advised pt she can contact me or come see me if she needs to at a later date. Pt verbalized understanding.

## 2015-08-18 ENCOUNTER — Ambulatory Visit (HOSPITAL_COMMUNITY)
Admission: RE | Admit: 2015-08-18 | Discharge: 2015-08-18 | Disposition: A | Discharge status: Home / Self Care | Payer: BLUE CROSS/BLUE SHIELD | Source: Ambulatory Visit | Attending: Gynecologic Oncology | Admitting: Gynecologic Oncology

## 2015-08-18 ENCOUNTER — Encounter (HOSPITAL_COMMUNITY): Payer: Self-pay

## 2015-08-18 DIAGNOSIS — Z833 Family history of diabetes mellitus: Secondary | ICD-10-CM | POA: Insufficient documentation

## 2015-08-18 DIAGNOSIS — Z7951 Long term (current) use of inhaled steroids: Secondary | ICD-10-CM | POA: Diagnosis not present

## 2015-08-18 DIAGNOSIS — Z885 Allergy status to narcotic agent status: Secondary | ICD-10-CM | POA: Diagnosis not present

## 2015-08-18 DIAGNOSIS — G4733 Obstructive sleep apnea (adult) (pediatric): Secondary | ICD-10-CM | POA: Insufficient documentation

## 2015-08-18 DIAGNOSIS — Z7984 Long term (current) use of oral hypoglycemic drugs: Secondary | ICD-10-CM | POA: Insufficient documentation

## 2015-08-18 DIAGNOSIS — J45909 Unspecified asthma, uncomplicated: Secondary | ICD-10-CM | POA: Insufficient documentation

## 2015-08-18 DIAGNOSIS — I10 Essential (primary) hypertension: Secondary | ICD-10-CM | POA: Insufficient documentation

## 2015-08-18 DIAGNOSIS — R590 Localized enlarged lymph nodes: Secondary | ICD-10-CM | POA: Insufficient documentation

## 2015-08-18 DIAGNOSIS — R1900 Intra-abdominal and pelvic swelling, mass and lump, unspecified site: Secondary | ICD-10-CM | POA: Insufficient documentation

## 2015-08-18 DIAGNOSIS — E119 Type 2 diabetes mellitus without complications: Secondary | ICD-10-CM | POA: Insufficient documentation

## 2015-08-18 DIAGNOSIS — Z79899 Other long term (current) drug therapy: Secondary | ICD-10-CM | POA: Insufficient documentation

## 2015-08-18 DIAGNOSIS — Z8249 Family history of ischemic heart disease and other diseases of the circulatory system: Secondary | ICD-10-CM | POA: Insufficient documentation

## 2015-08-18 DIAGNOSIS — F329 Major depressive disorder, single episode, unspecified: Secondary | ICD-10-CM | POA: Insufficient documentation

## 2015-08-18 DIAGNOSIS — R971 Elevated cancer antigen 125 [CA 125]: Secondary | ICD-10-CM | POA: Insufficient documentation

## 2015-08-18 DIAGNOSIS — N838 Other noninflammatory disorders of ovary, fallopian tube and broad ligament: Secondary | ICD-10-CM

## 2015-08-18 LAB — CBC
HEMATOCRIT: 37.2 % (ref 36.0–46.0)
HEMOGLOBIN: 11.7 g/dL — AB (ref 12.0–15.0)
MCH: 26.1 pg (ref 26.0–34.0)
MCHC: 31.5 g/dL (ref 30.0–36.0)
MCV: 83 fL (ref 78.0–100.0)
Platelets: 359 10*3/uL (ref 150–400)
RBC: 4.48 MIL/uL (ref 3.87–5.11)
RDW: 13.6 % (ref 11.5–15.5)
WBC: 11.9 10*3/uL — ABNORMAL HIGH (ref 4.0–10.5)

## 2015-08-18 LAB — GLUCOSE, CAPILLARY: Glucose-Capillary: 130 mg/dL — ABNORMAL HIGH (ref 65–99)

## 2015-08-18 LAB — APTT: aPTT: 32 seconds (ref 24–37)

## 2015-08-18 LAB — PROTIME-INR
INR: 1.15 (ref 0.00–1.49)
PROTHROMBIN TIME: 14.9 s (ref 11.6–15.2)

## 2015-08-18 MED ORDER — MIDAZOLAM HCL 2 MG/2ML IJ SOLN
INTRAMUSCULAR | Status: AC
  Filled 2015-08-18: qty 6

## 2015-08-18 MED ORDER — SODIUM CHLORIDE 0.9 % IV SOLN
Freq: Once | INTRAVENOUS | Status: AC
  Administered 2015-08-18: 07:00:00 via INTRAVENOUS

## 2015-08-18 MED ORDER — FENTANYL CITRATE (PF) 100 MCG/2ML IJ SOLN
INTRAMUSCULAR | Status: AC
  Filled 2015-08-18: qty 6

## 2015-08-18 MED ORDER — MIDAZOLAM HCL 2 MG/2ML IJ SOLN
INTRAMUSCULAR | Status: AC | PRN
  Administered 2015-08-18 (×2): 1 mg via INTRAVENOUS

## 2015-08-18 MED ORDER — FENTANYL CITRATE (PF) 100 MCG/2ML IJ SOLN
INTRAMUSCULAR | Status: AC | PRN
  Administered 2015-08-18 (×2): 50 ug via INTRAVENOUS

## 2015-08-18 NOTE — Discharge Instructions (Signed)
Needle Biopsy, Care After °Refer to this sheet in the next few weeks. These instructions provide you with information about caring for yourself after your procedure. Your health care provider may also give you more specific instructions. Your treatment has been planned according to current medical practices, but problems sometimes occur. Call your health care provider if you have any problems or questions after your procedure. °WHAT TO EXPECT AFTER THE PROCEDURE °After your procedure, it is common to have soreness, bruising, or mild pain at the biopsy site. This should go away in a few days. °HOME CARE INSTRUCTIONS °· Rest as directed by your health care provider. °· Take medicines only as directed by your health care provider. °· There are many different ways to close and cover the biopsy site, including stitches (sutures), skin glue, and adhesive strips. Follow your health care provider's instructions about: °· Biopsy site care. °· Bandage (dressing) changes and removal. °· Biopsy site closure removal. °· Check your biopsy site every day for signs of infection. Watch for: °· Redness, swelling, or pain. °· Fluid, blood, or pus. °SEEK MEDICAL CARE IF: °· You have a fever. °· You have redness, swelling, or pain at the biopsy site that lasts longer than a few days. °· You have fluid, blood, or pus coming from the biopsy site. °· You feel nauseous. °· You vomit. °SEEK IMMEDIATE MEDICAL CARE IF: °· You have shortness of breath. °· You have trouble breathing. °· You have chest pain.   °· You feel dizzy or you faint. °· You have bleeding that does not stop with pressure or a bandage. °· You cough up blood. °· You have pain in your abdomen. °  °This information is not intended to replace advice given to you by your health care provider. Make sure you discuss any questions you have with your health care provider. °  °Document Released: 02/04/2015 Document Reviewed: 02/04/2015 °Elsevier Interactive Patient Education ©2016  [employer] °Moderate Conscious Sedation, Adult, Care After °Refer to this sheet in the next few weeks. These instructions provide you with information on caring for yourself after your procedure. Your health care provider may also give you more specific instructions. Your treatment has been planned according to current medical practices, but problems sometimes occur. Call your health care provider if you have any problems or questions after your procedure. °WHAT TO EXPECT AFTER THE PROCEDURE  °After your procedure: °· You may feel sleepy, clumsy, and have poor balance for several hours. °· Vomiting may occur if you eat too soon after the procedure. °HOME CARE INSTRUCTIONS °· Do not participate in any activities where you could become injured for at least 24 hours. Do not: °¨ Drive. °¨ Swim. °¨ Ride a bicycle. °¨ Operate heavy machinery. °¨ Cook. °¨ Use power tools. °¨ Climb ladders. °¨ Work from a high place. °· Do not make important decisions or sign legal documents until you are improved. °· If you vomit, drink water, juice, or soup when you can drink without vomiting. Make sure you have little or no nausea before eating solid foods. °· Only take over-the-counter or prescription medicines for pain, discomfort, or fever as directed by your health care provider. °· Make sure you and your family fully understand everything about the medicines given to you, including what side effects may occur. °· You should not drink alcohol, take sleeping pills, or take medicines that cause drowsiness for at least 24 hours. °· If you smoke, do not smoke without supervision. °· If you are   feeling better, you may resume normal activities 24 hours after you were sedated. °· Keep all appointments with your health care provider. °SEEK MEDICAL CARE IF: °· Your skin is pale or bluish in color. °· You continue to feel nauseous or vomit. °· Your pain is getting worse and is not helped by medicine. °· You have bleeding or swelling. °· You  are still sleepy or feeling clumsy after 24 hours. °SEEK IMMEDIATE MEDICAL CARE IF: °· You develop a rash. °· You have difficulty breathing. °· You develop any type of allergic problem. °· You have a fever. °MAKE SURE YOU: °· Understand these instructions. °· Will watch your condition. °· Will get help right away if you are not doing well or get worse. °  °This information is not intended to replace advice given to you by your health care provider. Make sure you discuss any questions you have with your health care provider. °  °Document Released: 07/11/2013 Document Revised: 10/11/2014 Document Reviewed: 07/11/2013 °Elsevier Interactive Patient Education ©2016 [employer] Inc. ° ° ° °

## 2015-08-18 NOTE — H&P (Signed)
Chief Complaint: Patient was seen in consultation today for lymphadenopathy at the request of Cross,Melissa D  Referring Physician(s): Cross,Melissa D  History of Present Illness: Melissa Richards is a 49 y.o. female with suspected ovarian cancer questionable metastatic disease. She had previously had a left supraclavicular lymph node biopsy that did not reveal any malignant cells. She is here today for a right external iliac lymph node biopsy and is scheduled to start chemotherapy this upcoming Monday pending the results of biopsy and Gyn/Onc input. She denies any chest pain, shortness of breath or palpitations. She denies any active signs of bleeding or excessive bruising. She denies any recent fever or chills. She has previously tolerated sedation without complications.    Past Medical History  Diagnosis Date  . Arthritis   . Asthma   . Depression   . Diabetes mellitus without complication ([place])   . Hypertension     Past Surgical History  Procedure Laterality Date  . Tubal ligation    . Tonsillectomy    . Cardiac catheterization      2 years ago    Allergies: Hydrocodone-acetaminophen  Medications: Prior to Admission medications   Medication Sig Start Date End Date Taking? Authorizing Provider  AMLODIPINE BESYLATE PO Take by mouth.   Yes Historical Provider, MD  atenolol (TENORMIN) 25 MG tablet Take 25 mg by mouth daily.   Yes Historical Provider, MD  atorvastatin (LIPITOR) 40 MG tablet Take 40 mg by mouth daily.   Yes Historical Provider, MD  budesonide-formoterol (SYMBICORT) 160-4.5 MCG/ACT inhaler Inhale 2 puffs into the lungs 2 (two) times daily.   Yes Historical Provider, MD  busPIRone (BUSPAR) 15 MG tablet Take 15 mg by mouth 2 (two) times daily.   Yes Historical Provider, MD  citalopram (CELEXA) 40 MG tablet Take 40 mg by mouth daily.   Yes Historical Provider, MD  metFORMIN (GLUCOPHAGE) 500 MG tablet Take by mouth daily with breakfast.   Yes Historical Provider,  MD  ondansetron (ZOFRAN) 8 MG tablet Take 1 tablet (8 mg total) by mouth every 8 (eight) hours as needed for nausea. Will not make drowsy. 08/12/15  Yes Lennis Marion Downer, MD  traMADol (ULTRAM) 50 MG tablet Take 1 tablet (50 mg total) by mouth every 6 (six) hours as needed. 07/28/15  Yes [name], MD  traZODone (DESYREL) 50 MG tablet Take 50 mg by mouth at bedtime.   Yes Historical Provider, MD  dexamethasone (DECADRON) 4 MG tablet Take 5 tablets with food(=20 mg) 12 hrs and 6 hrs prior to Taxol chemotherapy 08/12/15   Lennis Marion Downer, MD  lisinopril (PRINIVIL,ZESTRIL) 20 MG tablet Take 20 mg by mouth daily.    Historical Provider, MD  LORazepam (ATIVAN) 1 MG tablet Place 1 tablet under the tongue or swallow every 6 hrs as needed for nausea. Will make drowsy. Take night of chemo whether or not nauseous. Do not take close to trazadone. 08/12/15   Lennis Marion Downer, MD     Family History  Problem Relation Age of Onset  . Anesthesia problems Mother   . Diabetes Mother   . Hypertension Mother   . Cancer Father   . Diabetes Brother     Social History   Social History  . Marital Status: Married    Spouse Name: N/A  . Number of Children: N/A  . Years of Education: N/A   Social History Main Topics  . Smoking status: Never Smoker   . Smokeless tobacco: Never Used  .  Alcohol Use: No  . Drug Use: No  . Sexual Activity: Not Asked   Other Topics Concern  . None   Social History Narrative   Review of Systems: A 12 point ROS discussed and pertinent positives are indicated in the HPI above.  All other systems are negative.  Review of Systems  Vital Signs: T: 98.1 F, HR: 106 BPM, BP: 143/92 mmHg O2: 97% RA  Physical Exam  Constitutional: She is oriented to person, place, and time. No distress.  HENT:  Head: Normocephalic and atraumatic.  Cardiovascular: Normal rate and regular rhythm.  Exam reveals no gallop and no friction rub.   No murmur heard. Pulmonary/Chest: Effort normal and  breath sounds normal. No respiratory distress. She has no wheezes. She has no rales.  Abdominal: Soft. Bowel sounds are normal. She exhibits distension.  Neurological: She is alert and oriented to person, place, and time.  Skin: Skin is warm and dry. She is not diaphoretic.    Mallampati Score:  MD Evaluation Airway: WNL Heart: WNL Abdomen: WNL Chest/ Lungs: WNL ASA  Classification: 3 Mallampati/Airway Score: Two  Imaging: Ct Chest W Contrast  07/30/2015  CLINICAL DATA:  Staging for newly diagnosed ovarian carcinoma. Elevated CA 125 marker. Initial encounter EXAM: CT CHEST WITH CONTRAST TECHNIQUE: Multidetector CT imaging of the chest was performed during intravenous contrast administration. CONTRAST:  37mL OMNIPAQUE IOHEXOL 300 MG/ML  SOLN COMPARISON:  CT abdomen pelvis 07/21/2015 FINDINGS: Mediastinum/Nodes: 9 mm LEFT supraclavicular lymph node (image 6, series 2). This lymph node is best depicted on coronal image 63. There is enlargement of the LEFT lobe of thyroid gland 231 x 35 mm with coarse internal calcifications. Enlarged RIGHT lower paratracheal lymph node measures 16 mm short axis. Enlarged RIGHT hilar lymph node measures 17 mm. A prominent LEFT hilar lymph nodes measure up to 9 mm. No central pulmonary embolism. Lungs/Pleura: Review of the lung parenchyma demonstrates bilateral small pulmonary nodules with indistinct margins. Example nodules RIGHT lower lobe measures 5 mm on image 33, series 5. RIGHT upper lobe nodule measures 4 mm image 17 series 5 RIGHT middle lobe nodule measures 5 mm image 21 Several nodules are dispersed along the horizontal oblique fissure (image 28) Within the LEFT lower lobe 5 mm nodule image 30 5 mm nodule within the lingula on image 26. Upper abdomen: Limited view of the liver, kidneys, pancreas are unremarkable. Normal adrenal glands. Musculoskeletal: No aggressive osseous lesion. IMPRESSION: 1. Bilateral small pulmonary nodules are concerning for pulmonary  metastasis. 2. Mild mediastinal and bilateral hilar adenopathy is also concerning for metastatic disease. 3. Mildly enlarged LEFT supraclavicular lymph node is concerning for metastatic disease. This lymph node may be amenable to ultrasound-guided biopsy. 4. Enlargement of the LEFT lobe of thyroid gland likely represents benign goiter. Electronically Signed   By: Suzy Bouchard M.D.   On: 07/30/2015 14:14   US Biopsy  08/05/2015  CLINICAL DATA:  Cystic left ovarian mass, abdominal ascites and omental edema, bilateral pulmonary nodules, thoracic adenopathy, left supraclavicular adenopathy. EXAM: ULTRASOUND GUIDED FNA BIOPSY OF LEFT SUPRACLAVICULAR ADENOPATHY MEDICATIONS: Intravenous Fentanyl and Versed were administered as conscious sedation during continuous cardiorespiratory monitoring by the radiology RN, with a total moderate sedation time of 35 minutes. PROCEDURE: The procedure, risks, benefits, and alternatives were explained to the patient. Questions regarding the procedure were encouraged and answered. The patient understands and consents to the procedure. Survey ultrasound of the left neck performed on the supraclavicular adenopathy localized. An appropriate skin entry site was determined  and marked. The operative field was prepped with chlorhexidine in a sterile fashion, and a sterile drape was applied covering the operative field. A sterile gown and sterile gloves were used for the procedure. Local anesthesia was provided with 1% Lidocaine. Under real-time ultrasound guidance, FNA biopsy x3 using 21 gauge Inrad needles performed. Core biopsy not performed due to proximity to major vascular structures and pleural surface. Quick stain analysis was adequate. Postprocedure scans show no hematoma or other apparent complication. The patient tolerated the procedure well. COMPLICATIONS: None immediate FINDINGS: Enlarged left supraclavicular node measuring approximately 24 x 17 mm was localized. FNA biopsy  under ultrasound guidance as above. IMPRESSION: 1. Technically successful ultrasound-guided FNA biopsy of left supraclavicular adenopathy. Electronically Signed   By: Lucrezia Europe M.D.   On: 08/05/2015 11:36    Labs:  CBC:  Recent Labs  08/05/15 0840 08/07/15 1151 08/18/15 0720  WBC 10.9* 11.2* 11.9*  HGB 12.1 12.3 11.7*  HCT 38.1 38.4 37.2  PLT 349 411* 359    COAGS:  Recent Labs  08/05/15 0840 08/18/15 0720  INR 1.09 1.15  APTT 29 32    BMP:  Recent Labs  07/30/15 1241 08/07/15 1151  NA  --  135*  K  --  4.3  CO2  --  25  GLUCOSE  --  183*  BUN  --  8.5  CALCIUM  --  9.8  CREATININE 0.80 1.0    LIVER FUNCTION TESTS:  Recent Labs  08/07/15 1151  BILITOT 0.68  AST 10  ALT 13  ALKPHOS 90  PROT 7.0  ALBUMIN 3.1*    Assessment and Plan: Imaging and CA 125 consistent with ovarian cancer, scheduled to start chemotherapy Monday 08/25/15 pending biopsy results.  S/p left supraclavicular lymph node biopsy on 11/1- no malignant cells seen Request for right external iliac lymph node biopsy with sedation today The patient has been NPO, no blood thinners taken, labs and vitals have been reviewed. Risks and Benefits discussed with the patient including, but not limited to bleeding, infection, damage to adjacent structures or low yield requiring additional tests. All of the patient's questions were answered, patient is agreeable to proceed. Consent signed and in chart. OSA, does not use CPAP   Signed: Hedy Jacob 08/18/2015, 8:43 AM

## 2015-08-18 NOTE — Procedures (Signed)
Technically successful CT guided biopsy of indeterminate right adnexal/pelvic mass.  No immediate post procedural complications.   Ronny Bacon, MD Pager #: [phone]

## 2015-08-19 ENCOUNTER — Telehealth: Payer: Self-pay

## 2015-08-19 ENCOUNTER — Telehealth: Payer: Self-pay | Admitting: Gynecologic Oncology

## 2015-08-19 NOTE — Telephone Encounter (Signed)
Patient Demographics     Patient Name Sex DOB SSN Address Phone    Melissa Richards, Melissa Richards Female 10/26/1965 [id number] [place] [id number] [phone] [facility]) [phone] (Mobile)      Message  Received: 1 week ago    Gordy Levan, MD  Baruch Merl, RN           Please send to pharmacy [facility] always fine   1.zofran 8 mg: 1 q 8 hr prn nausea. Will not make drowsy. Take AM after chemo whether or not nausea. #30 1RF   2.ativan 1 mg: 1 SL or po q 6 hr prn nausea. Will make drowsy. Take 1 night of chemo whether or not drowsy. Do not take close to trazadone #20 NRF   3.decadron 4 mg: Five tabs with food (=20mg ) 12 hrs prior to chemo and 5 tabs with  food (=20 mg) 6 hrs prior to chemo #10 for first cycle    RN please review meds with patient prior to first chemo. Likely first Rx 11-21, but waiting for biopsy next week to confirm   thanks

## 2015-08-19 NOTE — Telephone Encounter (Signed)
Spoke with Melissa Richards and reviewed the times for the steroids as her treatment is scheduled for 0845 on 08-25-15.  Told her to use 0900 as time so math easier to remember. Reviewed instructions as noted below by Dr. Marko Plume.  Melissa Richards wrote instructions down and verbalized understanding.  Told he that her husband's FMLA papers were faxed to [place], [place] on Friday 08-15-15.

## 2015-08-19 NOTE — Telephone Encounter (Signed)
Patient called wanting to know the status of her FMLA forms.  Advised that her request would be given to Barbaraann Share, RN for Dr. Marko Plume.  Message left for Barbaraann Share, RN on her desk with patient's request for an update.

## 2015-08-19 NOTE — Telephone Encounter (Signed)
Ms. Melissa Richards had her biopsy yesterday of the right ovarian adnexal lesion. She stated that she has not felt well today.  The area of biopsy is sore.  Ultram effective for the pain.The site is not red or swollen. Denies any drainage. Temp was 100 max today. She used an Education officer, museum. Will get an oral thermometer tomorrow as suggested in the treatment class at the Cancer center. Pt is nauseous and vomiting at times.  Using ativan with good effect.  Pt. Was nauseous prior to the biopsy yesterday. Told Ms. Melissa Richards that her temperature can be elevated after the biopsy, however, if she has a temp of 101.5 with shaking chills she needs to call the office or go to the ED to be evaluated.   Ms. Melissa Richards verbalized understanding.   She knows to call [phone] if she has any further questions or concerns.

## 2015-08-21 ENCOUNTER — Emergency Department (HOSPITAL_COMMUNITY)
Admission: EM | Admit: 2015-08-21 | Discharge: 2015-08-21 | Disposition: A | Discharge status: Home / Self Care | Payer: BLUE CROSS/BLUE SHIELD | Attending: Emergency Medicine | Admitting: Emergency Medicine

## 2015-08-21 ENCOUNTER — Emergency Department (HOSPITAL_COMMUNITY): Payer: BLUE CROSS/BLUE SHIELD

## 2015-08-21 ENCOUNTER — Encounter (HOSPITAL_COMMUNITY): Payer: Self-pay | Admitting: Emergency Medicine

## 2015-08-21 DIAGNOSIS — F329 Major depressive disorder, single episode, unspecified: Secondary | ICD-10-CM | POA: Insufficient documentation

## 2015-08-21 DIAGNOSIS — E119 Type 2 diabetes mellitus without complications: Secondary | ICD-10-CM | POA: Insufficient documentation

## 2015-08-21 DIAGNOSIS — Z79899 Other long term (current) drug therapy: Secondary | ICD-10-CM | POA: Diagnosis not present

## 2015-08-21 DIAGNOSIS — R630 Anorexia: Secondary | ICD-10-CM | POA: Diagnosis not present

## 2015-08-21 DIAGNOSIS — N83202 Unspecified ovarian cyst, left side: Secondary | ICD-10-CM | POA: Insufficient documentation

## 2015-08-21 DIAGNOSIS — N838 Other noninflammatory disorders of ovary, fallopian tube and broad ligament: Secondary | ICD-10-CM

## 2015-08-21 DIAGNOSIS — Z859 Personal history of malignant neoplasm, unspecified: Secondary | ICD-10-CM | POA: Diagnosis not present

## 2015-08-21 DIAGNOSIS — R1032 Left lower quadrant pain: Secondary | ICD-10-CM

## 2015-08-21 DIAGNOSIS — Z7952 Long term (current) use of systemic steroids: Secondary | ICD-10-CM | POA: Diagnosis not present

## 2015-08-21 DIAGNOSIS — I10 Essential (primary) hypertension: Secondary | ICD-10-CM | POA: Diagnosis not present

## 2015-08-21 DIAGNOSIS — N9989 Other postprocedural complications and disorders of genitourinary system: Secondary | ICD-10-CM | POA: Insufficient documentation

## 2015-08-21 DIAGNOSIS — Z8739 Personal history of other diseases of the musculoskeletal system and connective tissue: Secondary | ICD-10-CM | POA: Diagnosis not present

## 2015-08-21 DIAGNOSIS — J45909 Unspecified asthma, uncomplicated: Secondary | ICD-10-CM | POA: Diagnosis not present

## 2015-08-21 DIAGNOSIS — R109 Unspecified abdominal pain: Secondary | ICD-10-CM | POA: Diagnosis present

## 2015-08-21 HISTORY — DX: Malignant (primary) neoplasm, unspecified: C80.1

## 2015-08-21 LAB — URINALYSIS, ROUTINE W REFLEX MICROSCOPIC
Bilirubin Urine: NEGATIVE
Glucose, UA: NEGATIVE mg/dL
Hgb urine dipstick: NEGATIVE
KETONES UR: 15 mg/dL — AB
LEUKOCYTES UA: NEGATIVE
NITRITE: NEGATIVE
PH: 5.5 (ref 5.0–8.0)
Protein, ur: NEGATIVE mg/dL
SPECIFIC GRAVITY, URINE: 1.018 (ref 1.005–1.030)

## 2015-08-21 LAB — COMPREHENSIVE METABOLIC PANEL
ALBUMIN: 3.4 g/dL — AB (ref 3.5–5.0)
ALT: 16 U/L (ref 14–54)
ANION GAP: 15 (ref 5–15)
AST: 18 U/L (ref 15–41)
Alkaline Phosphatase: 76 U/L (ref 38–126)
BILIRUBIN TOTAL: 0.8 mg/dL (ref 0.3–1.2)
BUN: 8 mg/dL (ref 6–20)
CO2: 23 mmol/L (ref 22–32)
Calcium: 9.2 mg/dL (ref 8.9–10.3)
Chloride: 98 mmol/L — ABNORMAL LOW (ref 101–111)
Creatinine, Ser: 0.86 mg/dL (ref 0.44–1.00)
GFR calc Af Amer: >60 mL/min (ref >60)
Glucose, Bld: 175 mg/dL — ABNORMAL HIGH (ref 65–99)
POTASSIUM: 3.9 mmol/L (ref 3.5–5.1)
Sodium: 136 mmol/L (ref 135–145)
TOTAL PROTEIN: 7.5 g/dL (ref 6.5–8.1)

## 2015-08-21 LAB — CBC
HEMATOCRIT: 37 % (ref 36.0–46.0)
HEMOGLOBIN: 12 g/dL (ref 12.0–15.0)
MCH: 26.2 pg (ref 26.0–34.0)
MCHC: 32.4 g/dL (ref 30.0–36.0)
MCV: 80.8 fL (ref 78.0–100.0)
Platelets: 404 10*3/uL — ABNORMAL HIGH (ref 150–400)
RBC: 4.58 MIL/uL (ref 3.87–5.11)
RDW: 13.7 % (ref 11.5–15.5)
WBC: 14.7 10*3/uL — AB (ref 4.0–10.5)

## 2015-08-21 MED ORDER — HYDROMORPHONE HCL 1 MG/ML IJ SOLN
1.0000 mg | Freq: Once | INTRAMUSCULAR | Status: AC
  Administered 2015-08-21: 1 mg via INTRAVENOUS
  Filled 2015-08-21: qty 1

## 2015-08-21 MED ORDER — PROMETHAZINE HCL 25 MG PO TABS: 25.0000 mg | ORAL_TABLET | Freq: Four times a day (QID) | ORAL | Status: DC | PRN

## 2015-08-21 MED ORDER — OXYCODONE HCL 5 MG PO TABS: 5.0000 mg | ORAL_TABLET | ORAL | Status: DC | PRN

## 2015-08-21 MED ORDER — ONDANSETRON HCL 4 MG/2ML IJ SOLN
4.0000 mg | Freq: Once | INTRAMUSCULAR | Status: AC
  Administered 2015-08-21: 4 mg via INTRAVENOUS
  Filled 2015-08-21: qty 2

## 2015-08-21 MED ORDER — SODIUM CHLORIDE 0.9 % IV BOLUS (SEPSIS)
1000.0000 mL | Freq: Once | INTRAVENOUS | Status: AC
  Administered 2015-08-21: 1000 mL via INTRAVENOUS

## 2015-08-21 NOTE — Discharge Instructions (Signed)

## 2015-08-21 NOTE — ED Notes (Signed)
Per pt, states left lower abdominal pain radiating to back-recently diagnosed with ovarian cancer

## 2015-08-21 NOTE — ED Provider Notes (Signed)
CSN: [id number]     Arrival date & time 08/21/15  [id number] History   First MD Initiated Contact with Patient 08/21/15 1010     Chief Complaint  Patient presents with  . Abdominal Pain     (Consider location/radiation/quality/duration/timing/severity/associated sxs/prior Treatment) HPI Comments: Biopsy done Monday 11/14 Now going to do hysterectomy based on biopsy Left lower side to back, has been going to Dr and was on nausea/pain medications, tried to take them this AM and threw them up Pain getting worse  LLQ pain off and on for several months Worsened over last few days Different positions help Heating pad helps Cramping/sharp, was coming and going but constant--for 24hr 10/10 now If move then severe   Patient is a 49 y.o. female presenting with abdominal pain.  Abdominal Pain Associated symptoms: dysuria, nausea and vomiting (one episode)   Associated symptoms: no chest pain, no constipation (took some miralax, but not feelign constipated), no cough, no diarrhea, no fever, no shortness of breath, no sore throat, no vaginal bleeding and no vaginal discharge     Past Medical History  Diagnosis Date  . Arthritis   . Asthma   . Depression   . Diabetes mellitus without complication ([place])   . Hypertension   . Cancer [facility])    Past Surgical History  Procedure Laterality Date  . Tubal ligation    . Tonsillectomy    . Cardiac catheterization      2 years ago   Family History  Problem Relation Age of Onset  . Anesthesia problems Mother   . Diabetes Mother   . Hypertension Mother   . Cancer Father   . Diabetes Brother    Social History  Substance Use Topics  . Smoking status: Never Smoker   . Smokeless tobacco: Never Used  . Alcohol Use: No   OB History    No data available     Review of Systems  Constitutional: Positive for appetite change. Negative for fever.  HENT: Negative for sore throat.   Eyes: Negative for visual disturbance.  Respiratory: Negative for  cough and shortness of breath.   Cardiovascular: Negative for chest pain.  Gastrointestinal: Positive for nausea, vomiting (one episode) and abdominal pain. Negative for diarrhea, constipation (took some miralax, but not feelign constipated) and blood in stool.  Genitourinary: Positive for dysuria. Negative for vaginal bleeding, vaginal discharge and difficulty urinating.  Musculoskeletal: Negative for back pain and neck pain.  Skin: Negative for rash.  Neurological: Negative for syncope and headaches.      Allergies  Hydrocodone  Home Medications   Prior to Admission medications   Medication Sig Start Date End Date Taking? Authorizing Provider  atenolol (TENORMIN) 25 MG tablet Take 25 mg by mouth daily.   Yes Historical Provider, MD  atorvastatin (LIPITOR) 40 MG tablet Take 40 mg by mouth daily.   Yes Historical Provider, MD  budesonide-formoterol (SYMBICORT) 160-4.5 MCG/ACT inhaler Inhale 2 puffs into the lungs 2 (two) times daily.   Yes Historical Provider, MD  busPIRone (BUSPAR) 15 MG tablet Take 15 mg by mouth 2 (two) times daily.   Yes Historical Provider, MD  citalopram (CELEXA) 40 MG tablet Take 40 mg by mouth daily.   Yes Historical Provider, MD  lisinopril (PRINIVIL,ZESTRIL) 20 MG tablet Take 20 mg by mouth daily.   Yes Historical Provider, MD  LORazepam (ATIVAN) 1 MG tablet Place 1 tablet under the tongue or swallow every 6 hrs as needed for nausea. Will make drowsy. Take night  of chemo whether or not nauseous. Do not take close to trazadone. 08/12/15  Yes Lennis Marion Downer, MD  metFORMIN (GLUCOPHAGE) 500 MG tablet Take 500 mg by mouth daily with breakfast.    Yes Historical Provider, MD  ondansetron (ZOFRAN) 8 MG tablet Take 1 tablet (8 mg total) by mouth every 8 (eight) hours as needed for nausea. Will not make drowsy. 08/12/15  Yes Lennis Marion Downer, MD  traMADol (ULTRAM) 50 MG tablet Take 1 tablet (50 mg total) by mouth every 6 (six) hours as needed. Patient taking  differently: Take 50 mg by mouth every 6 (six) hours as needed (pain).  07/28/15  Yes [name], MD  traZODone (DESYREL) 50 MG tablet Take 50 mg by mouth at bedtime.   Yes Historical Provider, MD  dexamethasone (DECADRON) 4 MG tablet Take 5 tablets with food(=20 mg) 12 hrs and 6 hrs prior to Taxol chemotherapy 08/12/15   Lennis Marion Downer, MD  oxyCODONE (ROXICODONE) 5 MG immediate release tablet Take 1-2 tablets (5-10 mg total) by mouth every 4 (four) hours as needed for severe pain or breakthrough pain. 08/21/15   Gareth Morgan, MD  promethazine (PHENERGAN) 25 MG tablet Take 1 tablet (25 mg total) by mouth every 6 (six) hours as needed for nausea or vomiting. 08/21/15   Gareth Morgan, MD   BP 137/99 mmHg  Pulse 100  Temp(Src) 98.2 F (36.8 C) (Temporal)  Resp 16  SpO2 97% Physical Exam  Constitutional: She is oriented to person, place, and time. She appears well-developed and well-nourished. No distress.  HENT:  Head: Normocephalic and atraumatic.  Eyes: Conjunctivae and EOM are normal.  Neck: Normal range of motion.  Cardiovascular: Normal rate, regular rhythm, normal heart sounds and intact distal pulses.  Exam reveals no gallop and no friction rub.   No murmur heard. Pulmonary/Chest: Effort normal and breath sounds normal. No respiratory distress. She has no wheezes. She has no rales.  Abdominal: Soft. She exhibits no distension. There is tenderness in the left lower quadrant. There is no guarding, no CVA tenderness, no tenderness at McBurney's point and negative Murphy's sign.  Musculoskeletal: She exhibits no edema or tenderness.  Neurological: She is alert and oriented to person, place, and time.  Skin: Skin is warm and dry. No rash noted. She is not diaphoretic. No erythema.  Nursing note and vitals reviewed.   ED Course  Procedures (including critical care time) Labs Review Labs Reviewed  COMPREHENSIVE METABOLIC PANEL - Abnormal; Notable for the following:    Chloride 98  (*)    Glucose, Bld 175 (*)    Albumin 3.4 (*)    All other components within normal limits  CBC - Abnormal; Notable for the following:    WBC 14.7 (*)    Platelets 404 (*)    All other components within normal limits  URINALYSIS, ROUTINE W REFLEX MICROSCOPIC (NOT AT [facility]) - Abnormal; Notable for the following:    Color, Urine AMBER (*)    APPearance CLOUDY (*)    Ketones, ur 15 (*)    All other components within normal limits    Imaging Review US Transvaginal Non-ob  08/21/2015  CLINICAL DATA:  Left lower quadrant pain. EXAM: TRANSABDOMINAL AND TRANSVAGINAL ULTRASOUND OF PELVIS DOPPLER ULTRASOUND OF OVARIES TECHNIQUE: Both transabdominal and transvaginal ultrasound examinations of the pelvis were performed. Transabdominal technique was performed for global imaging of the pelvis including uterus, ovaries, adnexal regions, and pelvic cul-de-sac. It was necessary to proceed with endovaginal exam following the  transabdominal exam to visualize the endometrium and ovaries. Color and duplex Doppler ultrasound was utilized to evaluate blood flow to the ovaries. COMPARISON:  None. FINDINGS: Uterus Measurements: 8.3 x 2.7 x 3 cm. No fibroids or other mass visualized. Endometrium Not well-visualized Right ovary Not visualized Left ovary There is a complex cystic 22.6 x 17.2 x 22.5 cm left ovarian mass. Pulsed Doppler evaluation of the left ovary was performed. There is equivocal blood flow along the capsule of the cystic mass adjacent to the soft tissue component which is incompletely visualized. Other findings No free fluid. IMPRESSION: Large complex cystic left ovarian mass measuring 22.6 x 17.2 x 22.5 cm most concerning for cystic malignancy until proven otherwise. Equivocal peripheral blood flow along the capsule. Ovarian torsion cannot be definitively excluded. Right ovary an ovarian mass are not visualized on the pelvic ultrasound likely secondary to its location when correlated with recent CT abdomen  dated 07/21/2015. Electronically Signed   By: Kathreen Devoid   On: 08/21/2015 14:27   US Pelvis Complete  08/21/2015  CLINICAL DATA:  Left lower quadrant pain. EXAM: TRANSABDOMINAL AND TRANSVAGINAL ULTRASOUND OF PELVIS DOPPLER ULTRASOUND OF OVARIES TECHNIQUE: Both transabdominal and transvaginal ultrasound examinations of the pelvis were performed. Transabdominal technique was performed for global imaging of the pelvis including uterus, ovaries, adnexal regions, and pelvic cul-de-sac. It was necessary to proceed with endovaginal exam following the transabdominal exam to visualize the endometrium and ovaries. Color and duplex Doppler ultrasound was utilized to evaluate blood flow to the ovaries. COMPARISON:  None. FINDINGS: Uterus Measurements: 8.3 x 2.7 x 3 cm. No fibroids or other mass visualized. Endometrium Not well-visualized Right ovary Not visualized Left ovary There is a complex cystic 22.6 x 17.2 x 22.5 cm left ovarian mass. Pulsed Doppler evaluation of the left ovary was performed. There is equivocal blood flow along the capsule of the cystic mass adjacent to the soft tissue component which is incompletely visualized. Other findings No free fluid. IMPRESSION: Large complex cystic left ovarian mass measuring 22.6 x 17.2 x 22.5 cm most concerning for cystic malignancy until proven otherwise. Equivocal peripheral blood flow along the capsule. Ovarian torsion cannot be definitively excluded. Right ovary an ovarian mass are not visualized on the pelvic ultrasound likely secondary to its location when correlated with recent CT abdomen dated 07/21/2015. Electronically Signed   By: Kathreen Devoid   On: 08/21/2015 14:27   [name] Art/ven Flow Abd Pelv Doppler  08/21/2015  CLINICAL DATA:  Left lower quadrant pain. EXAM: TRANSABDOMINAL AND TRANSVAGINAL ULTRASOUND OF PELVIS DOPPLER ULTRASOUND OF OVARIES TECHNIQUE: Both transabdominal and transvaginal ultrasound examinations of the pelvis were performed. Transabdominal  technique was performed for global imaging of the pelvis including uterus, ovaries, adnexal regions, and pelvic cul-de-sac. It was necessary to proceed with endovaginal exam following the transabdominal exam to visualize the endometrium and ovaries. Color and duplex Doppler ultrasound was utilized to evaluate blood flow to the ovaries. COMPARISON:  None. FINDINGS: Uterus Measurements: 8.3 x 2.7 x 3 cm. No fibroids or other mass visualized. Endometrium Not well-visualized Right ovary Not visualized Left ovary There is a complex cystic 22.6 x 17.2 x 22.5 cm left ovarian mass. Pulsed Doppler evaluation of the left ovary was performed. There is equivocal blood flow along the capsule of the cystic mass adjacent to the soft tissue component which is incompletely visualized. Other findings No free fluid. IMPRESSION: Large complex cystic left ovarian mass measuring 22.6 x 17.2 x 22.5 cm most concerning for cystic malignancy until  proven otherwise. Equivocal peripheral blood flow along the capsule. Ovarian torsion cannot be definitively excluded. Right ovary an ovarian mass are not visualized on the pelvic ultrasound likely secondary to its location when correlated with recent CT abdomen dated 07/21/2015. Electronically Signed   By: Kathreen Devoid   On: 08/21/2015 14:27   I have personally reviewed and evaluated these images and lab results as part of my medical decision-making.   EKG Interpretation None      MDM   Final diagnoses:  Left lower quadrant pain  Ovarian mass, left   49yo female with history of elevated CA-125, bilateral cystic ovarian masses, supraclavicular lymph node, biopsy 11/14 of right ovary, under continued evaluation with GYN [name] and [place], presents with concern for left lower quadrant abdominal pain.  No sign of UTI.  Normal vital signs, normal hemoglobin, and doubt bleed from biopsy. Leukocytosis likely expected after recent procedure, and no fevers or right sided abdominal pain to  indicate infection from biopsy. (Also biopsy only 3 days ago.)  Given hx of ovarian mass, pt with colicky type pain, difficulty finding appropriate position, ordered US to evaluate for signs of torsion.  [name] equivical for torsion.  Called [name] and discussed [name], and given large size of mass (20cm) it is unlikely to have torsion.  Patient's pain is likely secondary to large cystic mass.  She has been taking tramadol and zofran at home with little relief.  Given dilaudid and zofran in ED with improvement.  Discharged with rx for pheneragan for nausea, oxycodone for pain.  Discussed medication can cause constipation and is addicting.  Recommended continued follow up with PCP and [name].  Patient discharged in stable condition with understanding of reasons to return.   Gareth Morgan, MD 08/21/15 2103

## 2015-08-25 ENCOUNTER — Other Ambulatory Visit: Payer: Self-pay | Admitting: Oncology

## 2015-08-25 ENCOUNTER — Ambulatory Visit: Payer: BLUE CROSS/BLUE SHIELD

## 2015-08-25 ENCOUNTER — Other Ambulatory Visit: Payer: BLUE CROSS/BLUE SHIELD

## 2015-08-26 ENCOUNTER — Other Ambulatory Visit: Payer: Self-pay

## 2015-08-26 ENCOUNTER — Encounter (HOSPITAL_COMMUNITY)
Admission: RE | Admit: 2015-08-26 | Discharge: 2015-08-26 | Disposition: A | Discharge status: Home / Self Care | Payer: BLUE CROSS/BLUE SHIELD | Source: Ambulatory Visit | Attending: Gynecologic Oncology | Admitting: Gynecologic Oncology

## 2015-08-26 ENCOUNTER — Encounter (HOSPITAL_COMMUNITY): Payer: Self-pay

## 2015-08-26 DIAGNOSIS — E119 Type 2 diabetes mellitus without complications: Secondary | ICD-10-CM | POA: Insufficient documentation

## 2015-08-26 DIAGNOSIS — Z01818 Encounter for other preprocedural examination: Secondary | ICD-10-CM | POA: Insufficient documentation

## 2015-08-26 DIAGNOSIS — N839 Noninflammatory disorder of ovary, fallopian tube and broad ligament, unspecified: Secondary | ICD-10-CM | POA: Insufficient documentation

## 2015-08-26 HISTORY — DX: Anxiety disorder, unspecified: F41.9

## 2015-08-26 HISTORY — DX: Paresthesia of skin: R20.0

## 2015-08-26 HISTORY — DX: Family history of other specified conditions: Z84.89

## 2015-08-26 HISTORY — DX: Other specified conditions associated with female genital organs and menstrual cycle: N94.89

## 2015-08-26 HISTORY — DX: Constipation, unspecified: K59.00

## 2015-08-26 HISTORY — DX: Anesthesia of skin: R20.2

## 2015-08-26 HISTORY — DX: Urinary tract infection, site not specified: N39.0

## 2015-08-26 HISTORY — DX: Sleep apnea, unspecified: G47.30

## 2015-08-26 LAB — CBC WITH DIFFERENTIAL/PLATELET
BASOS ABS: 0.1 10*3/uL (ref 0.0–0.1)
BASOS PCT: 0 %
EOS ABS: 0.1 10*3/uL (ref 0.0–0.7)
EOS PCT: 1 %
HCT: 36.6 % (ref 36.0–46.0)
Hemoglobin: 11.6 g/dL — ABNORMAL LOW (ref 12.0–15.0)
LYMPHS PCT: 5 %
Lymphs Abs: 0.7 10*3/uL (ref 0.7–4.0)
MCH: 25.6 pg — ABNORMAL LOW (ref 26.0–34.0)
MCHC: 31.7 g/dL (ref 30.0–36.0)
MCV: 80.6 fL (ref 78.0–100.0)
Monocytes Absolute: 1.1 10*3/uL — ABNORMAL HIGH (ref 0.1–1.0)
Monocytes Relative: 8 %
Neutro Abs: 12.2 10*3/uL — ABNORMAL HIGH (ref 1.7–7.7)
Neutrophils Relative %: 86 %
PLATELETS: 425 10*3/uL — AB (ref 150–400)
RBC: 4.54 MIL/uL (ref 3.87–5.11)
RDW: 13.9 % (ref 11.5–15.5)
WBC: 14.2 10*3/uL — AB (ref 4.0–10.5)

## 2015-08-26 LAB — URINALYSIS, ROUTINE W REFLEX MICROSCOPIC
Glucose, UA: NEGATIVE mg/dL
Hgb urine dipstick: NEGATIVE
Ketones, ur: NEGATIVE mg/dL
NITRITE: NEGATIVE
PROTEIN: 30 mg/dL — AB
SPECIFIC GRAVITY, URINE: 1.022 (ref 1.005–1.030)
pH: 6 (ref 5.0–8.0)

## 2015-08-26 LAB — COMPREHENSIVE METABOLIC PANEL
ALBUMIN: 2.9 g/dL — AB (ref 3.5–5.0)
ALT: 35 U/L (ref 14–54)
AST: 31 U/L (ref 15–41)
Alkaline Phosphatase: 93 U/L (ref 38–126)
Anion gap: 11 (ref 5–15)
BUN: 9 mg/dL (ref 6–20)
CHLORIDE: 97 mmol/L — AB (ref 101–111)
CO2: 27 mmol/L (ref 22–32)
CREATININE: 0.73 mg/dL (ref 0.44–1.00)
Calcium: 8.9 mg/dL (ref 8.9–10.3)
GFR calc Af Amer: >60 mL/min (ref >60)
GFR calc non Af Amer: >60 mL/min (ref >60)
GLUCOSE: 146 mg/dL — AB (ref 65–99)
Potassium: 3.9 mmol/L (ref 3.5–5.1)
SODIUM: 135 mmol/L (ref 135–145)
Total Bilirubin: 0.8 mg/dL (ref 0.3–1.2)
Total Protein: 7.3 g/dL (ref 6.5–8.1)

## 2015-08-26 LAB — URINE MICROSCOPIC-ADD ON

## 2015-08-26 LAB — ABO/RH: ABO/RH(D): O POS

## 2015-08-26 NOTE — Progress Notes (Signed)
Abnormal CBC / UA faxed to [facility]

## 2015-08-26 NOTE — Patient Instructions (Signed)
YOUR PROCEDURE IS SCHEDULED ON : 09/04/15  REPORT TO [place] MAIN ENTRANCE FOLLOW SIGNS TO EAST ELEVATOR - GO TO 3rd FLOOR CHECK IN AT 3 EAST NURSES STATION (SHORT STAY) AT: 5:30 AM  CALL THIS NUMBER IF YOU HAVE PROBLEMS THE MORNING OF SURGERY [phone]  REMEMBER:ONLY 1 PER PERSON MAY GO TO SHORT STAY WITH YOU TO GET READY THE MORNING OF YOUR SURGERY  DO NOT EAT FOOD OR DRINK LIQUIDS AFTER MIDNIGHT  TAKE THESE MEDICINES THE MORNING OF SURGERY:   ATENOLOL / BUSPAR / CITALOPRAM / OXYCODONE / PHENERGAN IF NEEDED / SYMBICORT   CLEAR LIQUIDS ONLY THE DAY BEFORE SURGERY      CLEAR LIQUID DIET   Foods Allowed                                                                     Foods Excluded  Coffee and tea, regular and decaf                             liquids that you cannot  Plain Jell-O in any flavor                                             see through such as: Fruit ices (not with fruit pulp)                                     milk, soups, orange juice  Iced Popsicles                                                    All solid food Cranberry, grape and apple juices Sports drinks like Gatorade Lightly seasoned clear broth or consume(fat free) Sugar, honey syrup  Sample Menu Breakfast                                Lunch                                     Supper Cranberry juice                    Beef broth                            Chicken broth Jell-O                                     Grape juice  Apple juice Coffee or tea                        Jell-O                                      Popsicle                                                Coffee or tea                        Coffee or tea  _____________________________________________________________________    YOU MAY NOT HAVE ANY METAL ON YOUR BODY INCLUDING HAIR PINS AND PIERCING'S. DO NOT WEAR JEWELRY, MAKEUP, LOTIONS, POWDERS OR PERFUMES. DO NOT WEAR NAIL  POLISH. DO NOT SHAVE 48 HRS PRIOR TO SURGERY. MEN MAY SHAVE FACE AND NECK.  DO NOT [place]. [facility] IS NOT RESPONSIBLE FOR VALUABLES.  CONTACTS, DENTURES OR PARTIALS MAY NOT BE WORN TO SURGERY. LEAVE SUITCASE IN CAR. CAN BE BROUGHT TO ROOM AFTER SURGERY.  PATIENTS DISCHARGED THE DAY OF SURGERY WILL NOT BE ALLOWED TO DRIVE HOME.  PLEASE READ OVER THE FOLLOWING INSTRUCTION SHEETS _________________________________________________________________________________                                          [place] - PREPARING FOR SURGERY  Before surgery, you can play an important role.  Because skin is not sterile, your skin needs to be as free of germs as possible.  You can reduce the number of germs on your skin by washing with CHG (chlorahexidine gluconate) soap before surgery.  CHG is an antiseptic cleaner which kills germs and bonds with the skin to continue killing germs even after washing. Please DO NOT use if you have an allergy to CHG or antibacterial soaps.  If your skin becomes reddened/irritated stop using the CHG and inform your nurse when you arrive at Short Stay. Do not shave (including legs and underarms) for at least 48 hours prior to the first CHG shower.  You may shave your face. Please follow these instructions carefully:   1.  Shower with CHG Soap the night before surgery and the  morning of Surgery.   2.  If you choose to wash your hair, wash your hair first as usual with your  normal  Shampoo.   3.  After you shampoo, rinse your hair and body thoroughly to remove the  shampoo.                                         4.  Use CHG as you would any other liquid soap.  You can apply chg directly  to the skin and wash . Gently wash with scrungie or clean wascloth    5.  Apply the CHG Soap to your body ONLY FROM THE NECK DOWN.   Do not use on open  Wound or open sores. Avoid contact with eyes, ears mouth and genitals (private  parts).                        Genitals (private parts) with your normal soap.              6.  Wash thoroughly, paying special attention to the area where your surgery  will be performed.   7.  Thoroughly rinse your body with warm water from the neck down.   8.  DO NOT shower/wash with your normal soap after using and rinsing off  the CHG Soap .                9.  Pat yourself dry with a clean towel.             10.  Wear clean night clothes to bed after shower             11.  Place clean sheets on your bed the night of your first shower and do not  sleep with pets.  Day of Surgery : Do not apply any lotions/deodorants the morning of surgery.  Please wear clean clothes to the hospital/surgery center.  FAILURE TO FOLLOW THESE INSTRUCTIONS MAY RESULT IN THE CANCELLATION OF YOUR SURGERY    PATIENT SIGNATURE_________________________________  ______________________________________________________________________     Melissa Richards  An incentive spirometer is a tool that can help keep your lungs clear and active. This tool measures how well you are filling your lungs with each breath. Taking long deep breaths may help reverse or decrease the chance of developing breathing (pulmonary) problems (especially infection) following:  A long period of time when you are unable to move or be active. BEFORE THE PROCEDURE   If the spirometer includes an indicator to show your best effort, your nurse or respiratory therapist will set it to a desired goal.  If possible, sit up straight or lean slightly forward. Try not to slouch.  Hold the incentive spirometer in an upright position. INSTRUCTIONS FOR USE   Sit on the edge of your bed if possible, or sit up as far as you can in bed or on a chair.  Hold the incentive spirometer in an upright position.  Breathe out normally.  Place the mouthpiece in your mouth and seal your lips tightly around it.  Breathe in slowly and as deeply  as possible, raising the piston or the ball toward the top of the column.  Hold your breath for 3-5 seconds or for as long as possible. Allow the piston or ball to fall to the bottom of the column.  Remove the mouthpiece from your mouth and breathe out normally.  Rest for a few seconds and repeat Steps 1 through 7 at least 10 times every 1-2 hours when you are awake. Take your time and take a few normal breaths between deep breaths.  The spirometer may include an indicator to show your best effort. Use the indicator as a goal to work toward during each repetition.  After each set of 10 deep breaths, practice coughing to be sure your lungs are clear. If you have an incision (the cut made at the time of surgery), support your incision when coughing by placing a pillow or rolled up towels firmly against it. Once you are able to get out of bed, walk around indoors and cough well. You may stop using the incentive spirometer when instructed by your caregiver.  RISKS AND COMPLICATIONS  Take your time so you do not get dizzy or light-headed.  If you are in pain, you may need to take or ask for pain medication before doing incentive spirometry. It is harder to take a deep breath if you are having pain. AFTER USE  Rest and breathe slowly and easily.  It can be helpful to keep track of a log of your progress. Your caregiver can provide you with a simple table to help with this. If you are using the spirometer at home, follow these instructions: [place] IF:   You are having difficultly using the spirometer.  You have trouble using the spirometer as often as instructed.  Your pain medication is not giving enough relief while using the spirometer.  You develop fever of 100.5 F (38.1 C) or higher. SEEK IMMEDIATE MEDICAL CARE IF:   You cough up bloody sputum that had not been present before.  You develop fever of 102 F (38.9 C) or greater.  You develop worsening pain at or near  the incision site. MAKE SURE YOU:   Understand these instructions.  Will watch your condition.  Will get help right away if you are not doing well or get worse. Document Released: 01/31/2007 Document Revised: 12/13/2011 Document Reviewed: 04/03/2007 ExitCare Patient Information 2014 [employer], [place].   ________________________________________________________________________  WHAT IS A BLOOD TRANSFUSION? Blood Transfusion Information  A transfusion is the replacement of blood or some of its parts. Blood is made up of multiple cells which provide different functions.  Red blood cells carry oxygen and are used for blood loss replacement.  White blood cells fight against infection.  Platelets control bleeding.  Plasma helps clot blood.  Other blood products are available for specialized needs, such as hemophilia or other clotting disorders. BEFORE THE TRANSFUSION  Who gives blood for transfusions?   Healthy volunteers who are fully evaluated to make sure their blood is safe. This is blood bank blood. Transfusion therapy is the safest it has ever been in the practice of medicine. Before blood is taken from a donor, a complete history is taken to make sure that person has no history of diseases nor engages in risky social behavior (examples are intravenous drug use or sexual activity with multiple partners). The donor's travel history is screened to minimize risk of transmitting infections, such as malaria. The donated blood is tested for signs of infectious diseases, such as HIV and hepatitis. The blood is then tested to be sure it is compatible with you in order to minimize the chance of a transfusion reaction. If you or a relative donates blood, this is often done in anticipation of surgery and is not appropriate for emergency situations. It takes many days to process the donated blood. RISKS AND COMPLICATIONS Although transfusion therapy is very safe and saves many lives, the main  dangers of transfusion include:   Getting an infectious disease.  Developing a transfusion reaction. This is an allergic reaction to something in the blood you were given. Every precaution is taken to prevent this. The decision to have a blood transfusion has been considered carefully by your caregiver before blood is given. Blood is not given unless the benefits outweigh the risks. AFTER THE TRANSFUSION  Right after receiving a blood transfusion, you will usually feel much better and more energetic. This is especially true if your red blood cells have gotten low (anemic). The transfusion raises the level of the red blood cells which carry oxygen, and this  usually causes an energy increase.  The nurse administering the transfusion will monitor you carefully for complications. HOME CARE INSTRUCTIONS  No special instructions are needed after a transfusion. You may find your energy is better. Speak with your caregiver about any limitations on activity for underlying diseases you may have. SEEK MEDICAL CARE IF:   Your condition is not improving after your transfusion.  You develop redness or irritation at the intravenous (IV) site. SEEK IMMEDIATE MEDICAL CARE IF:  Any of the following symptoms occur over the next 12 hours:  Shaking chills.  You have a temperature by mouth above 102 F (38.9 C), not controlled by medicine.  Chest, back, or muscle pain.  People around you feel you are not acting correctly or are confused.  Shortness of breath or difficulty breathing.  Dizziness and fainting.  You get a rash or develop hives.  You have a decrease in urine output.  Your urine turns a dark color or changes to pink, red, or brown. Any of the following symptoms occur over the next 10 days:  You have a temperature by mouth above 102 F (38.9 C), not controlled by medicine.  Shortness of breath.  Weakness after normal activity.  The white part of the eye turns yellow  (jaundice).  You have a decrease in the amount of urine or are urinating less often.  Your urine turns a dark color or changes to pink, red, or brown. Document Released: 09/17/2000 Document Revised: 12/13/2011 Document Reviewed: 05/06/2008 [facility] Patient Information 2014 [place], [place].  _______________________________________________________________________

## 2015-08-27 LAB — HEMOGLOBIN A1C
HEMOGLOBIN A1C: 7.2 % — AB (ref 4.8–5.6)
MEAN PLASMA GLUCOSE: 160 mg/dL

## 2015-08-27 NOTE — Telephone Encounter (Signed)
Orders received from [facility] to contact the patient and update with U/A results are "Neg" for blood in her urine. Patient conatcted and updated , patient states understanding , denies further questions or concerns at this time.

## 2015-09-01 ENCOUNTER — Other Ambulatory Visit: Payer: BLUE CROSS/BLUE SHIELD

## 2015-09-01 ENCOUNTER — Ambulatory Visit: Payer: BLUE CROSS/BLUE SHIELD | Admitting: Oncology

## 2015-09-04 ENCOUNTER — Inpatient Hospital Stay (HOSPITAL_COMMUNITY)
Admission: RE | Admit: 2015-09-04 | Discharge: 2015-09-08 | DRG: 737 | Disposition: A | Discharge status: Home / Self Care | Payer: BLUE CROSS/BLUE SHIELD | Source: Ambulatory Visit | Attending: Gynecologic Oncology | Admitting: Gynecologic Oncology

## 2015-09-04 ENCOUNTER — Encounter (HOSPITAL_COMMUNITY): Payer: Self-pay

## 2015-09-04 ENCOUNTER — Encounter (HOSPITAL_COMMUNITY)
Admission: RE | Disposition: A | Discharge status: Home / Self Care | Payer: Self-pay | Source: Ambulatory Visit | Attending: Gynecologic Oncology

## 2015-09-04 ENCOUNTER — Inpatient Hospital Stay (HOSPITAL_COMMUNITY): Payer: BLUE CROSS/BLUE SHIELD | Admitting: Registered Nurse

## 2015-09-04 DIAGNOSIS — R1900 Intra-abdominal and pelvic swelling, mass and lump, unspecified site: Secondary | ICD-10-CM | POA: Diagnosis present

## 2015-09-04 DIAGNOSIS — N83512 Torsion of left ovary and ovarian pedicle: Secondary | ICD-10-CM

## 2015-09-04 DIAGNOSIS — F329 Major depressive disorder, single episode, unspecified: Secondary | ICD-10-CM | POA: Diagnosis present

## 2015-09-04 DIAGNOSIS — Z809 Family history of malignant neoplasm, unspecified: Secondary | ICD-10-CM

## 2015-09-04 DIAGNOSIS — Z01810 Encounter for preprocedural cardiovascular examination: Secondary | ICD-10-CM

## 2015-09-04 DIAGNOSIS — G4733 Obstructive sleep apnea (adult) (pediatric): Secondary | ICD-10-CM | POA: Diagnosis present

## 2015-09-04 DIAGNOSIS — Z6841 Body Mass Index (BMI) 40.0 and over, adult: Secondary | ICD-10-CM

## 2015-09-04 DIAGNOSIS — M1990 Unspecified osteoarthritis, unspecified site: Secondary | ICD-10-CM | POA: Diagnosis present

## 2015-09-04 DIAGNOSIS — J45909 Unspecified asthma, uncomplicated: Secondary | ICD-10-CM | POA: Diagnosis present

## 2015-09-04 DIAGNOSIS — Z01812 Encounter for preprocedural laboratory examination: Secondary | ICD-10-CM | POA: Diagnosis not present

## 2015-09-04 DIAGNOSIS — N839 Noninflammatory disorder of ovary, fallopian tube and broad ligament, unspecified: Secondary | ICD-10-CM | POA: Diagnosis present

## 2015-09-04 DIAGNOSIS — C562 Malignant neoplasm of left ovary: Secondary | ICD-10-CM | POA: Diagnosis not present

## 2015-09-04 DIAGNOSIS — Z833 Family history of diabetes mellitus: Secondary | ICD-10-CM | POA: Diagnosis not present

## 2015-09-04 DIAGNOSIS — R0989 Other specified symptoms and signs involving the circulatory and respiratory systems: Secondary | ICD-10-CM

## 2015-09-04 DIAGNOSIS — Z885 Allergy status to narcotic agent status: Secondary | ICD-10-CM

## 2015-09-04 DIAGNOSIS — I10 Essential (primary) hypertension: Secondary | ICD-10-CM | POA: Diagnosis present

## 2015-09-04 DIAGNOSIS — Z8249 Family history of ischemic heart disease and other diseases of the circulatory system: Secondary | ICD-10-CM

## 2015-09-04 DIAGNOSIS — J9811 Atelectasis: Secondary | ICD-10-CM | POA: Diagnosis not present

## 2015-09-04 DIAGNOSIS — R188 Other ascites: Secondary | ICD-10-CM | POA: Diagnosis present

## 2015-09-04 DIAGNOSIS — E119 Type 2 diabetes mellitus without complications: Secondary | ICD-10-CM | POA: Diagnosis present

## 2015-09-04 DIAGNOSIS — N838 Other noninflammatory disorders of ovary, fallopian tube and broad ligament: Secondary | ICD-10-CM

## 2015-09-04 HISTORY — DX: Intra-abdominal and pelvic swelling, mass and lump, unspecified site: R19.00

## 2015-09-04 HISTORY — PX: ABDOMINAL HYSTERECTOMY: SHX81

## 2015-09-04 LAB — GLUCOSE, CAPILLARY
GLUCOSE-CAPILLARY: 149 mg/dL — AB (ref 65–99)
GLUCOSE-CAPILLARY: 192 mg/dL — AB (ref 65–99)
GLUCOSE-CAPILLARY: 162 mg/dL — AB (ref 65–99)
Glucose-Capillary: 174 mg/dL — ABNORMAL HIGH (ref 65–99)
Glucose-Capillary: 178 mg/dL — ABNORMAL HIGH (ref 65–99)

## 2015-09-04 LAB — TYPE AND SCREEN
ABO/RH(D): O POS
Antibody Screen: NEGATIVE

## 2015-09-04 SURGERY — HYSTERECTOMY, ABDOMINAL
Anesthesia: General | Laterality: Bilateral

## 2015-09-04 MED ORDER — LACTATED RINGERS IV SOLN
INTRAVENOUS | Status: DC | PRN
  Administered 2015-09-04 (×2): via INTRAVENOUS

## 2015-09-04 MED ORDER — BUSPIRONE HCL 15 MG PO TABS
15.0000 mg | ORAL_TABLET | Freq: Two times a day (BID) | ORAL | Status: DC
  Administered 2015-09-04 – 2015-09-08 (×5): 15 mg via ORAL
  Filled 2015-09-04 (×10): qty 1

## 2015-09-04 MED ORDER — MAGNESIUM HYDROXIDE 400 MG/5ML PO SUSP
30.0000 mL | Freq: Three times a day (TID) | ORAL | Status: AC
  Administered 2015-09-04 – 2015-09-05 (×2): 30 mL via ORAL
  Filled 2015-09-04 (×2): qty 30

## 2015-09-04 MED ORDER — FENTANYL CITRATE (PF) 100 MCG/2ML IJ SOLN
INTRAMUSCULAR | Status: DC | PRN
  Administered 2015-09-04 (×5): 50 ug via INTRAVENOUS

## 2015-09-04 MED ORDER — LABETALOL HCL 5 MG/ML IV SOLN
INTRAVENOUS | Status: DC | PRN
  Administered 2015-09-04 (×2): 2.5 mg via INTRAVENOUS
  Administered 2015-09-04 (×2): 5 mg via INTRAVENOUS

## 2015-09-04 MED ORDER — SUGAMMADEX SODIUM 500 MG/5ML IV SOLN
INTRAVENOUS | Status: AC
  Filled 2015-09-04: qty 5

## 2015-09-04 MED ORDER — INSULIN ASPART 100 UNIT/ML ~~LOC~~ SOLN
0.0000 [IU] | Freq: Three times a day (TID) | SUBCUTANEOUS | Status: DC
  Administered 2015-09-04: 3 [IU] via SUBCUTANEOUS
  Administered 2015-09-05 – 2015-09-06 (×5): 2 [IU] via SUBCUTANEOUS

## 2015-09-04 MED ORDER — ONDANSETRON HCL 4 MG PO TABS: 4.0000 mg | ORAL_TABLET | Freq: Four times a day (QID) | ORAL | Status: DC | PRN

## 2015-09-04 MED ORDER — ALBUTEROL SULFATE (2.5 MG/3ML) 0.083% IN NEBU: 2.5000 mg | INHALATION_SOLUTION | Freq: Four times a day (QID) | RESPIRATORY_TRACT | Status: DC | PRN

## 2015-09-04 MED ORDER — GABAPENTIN 300 MG PO CAPS
600.0000 mg | ORAL_CAPSULE | Freq: Every day | ORAL | Status: DC
  Filled 2015-09-04: qty 2

## 2015-09-04 MED ORDER — ATROPINE SULFATE 0.4 MG/ML IJ SOLN
INTRAMUSCULAR | Status: AC
  Filled 2015-09-04: qty 2

## 2015-09-04 MED ORDER — BUPIVACAINE LIPOSOME 1.3 % IJ SUSP
20.0000 mL | Freq: Once | INTRAMUSCULAR | Status: AC
  Administered 2015-09-04: 20 mL
  Filled 2015-09-04: qty 20

## 2015-09-04 MED ORDER — HYDROMORPHONE HCL 1 MG/ML IJ SOLN
INTRAMUSCULAR | Status: AC
  Filled 2015-09-04: qty 1

## 2015-09-04 MED ORDER — HYDROMORPHONE HCL 1 MG/ML IJ SOLN
0.2500 mg | INTRAMUSCULAR | Status: DC | PRN
  Administered 2015-09-04 (×4): 0.5 mg via INTRAVENOUS

## 2015-09-04 MED ORDER — HYDROMORPHONE HCL 1 MG/ML IJ SOLN
INTRAMUSCULAR | Status: DC | PRN
  Administered 2015-09-04 (×4): 0.5 mg via INTRAVENOUS

## 2015-09-04 MED ORDER — ONDANSETRON HCL 4 MG/2ML IJ SOLN
INTRAMUSCULAR | Status: AC
  Filled 2015-09-04: qty 2

## 2015-09-04 MED ORDER — HYDROMORPHONE HCL 2 MG/ML IJ SOLN
INTRAMUSCULAR | Status: AC
  Filled 2015-09-04: qty 1

## 2015-09-04 MED ORDER — ONDANSETRON HCL 4 MG/2ML IJ SOLN
INTRAMUSCULAR | Status: DC | PRN
  Administered 2015-09-04: 4 mg via INTRAVENOUS

## 2015-09-04 MED ORDER — PROPOFOL 10 MG/ML IV BOLUS
INTRAVENOUS | Status: AC
  Filled 2015-09-04: qty 40

## 2015-09-04 MED ORDER — ROCURONIUM BROMIDE 100 MG/10ML IV SOLN
INTRAVENOUS | Status: DC | PRN
  Administered 2015-09-04: 10 mg via INTRAVENOUS
  Administered 2015-09-04: 5 mg via INTRAVENOUS
  Administered 2015-09-04: 40 mg via INTRAVENOUS
  Administered 2015-09-04: 10 mg via INTRAVENOUS

## 2015-09-04 MED ORDER — LABETALOL HCL 5 MG/ML IV SOLN
INTRAVENOUS | Status: AC
  Filled 2015-09-04: qty 4

## 2015-09-04 MED ORDER — MIDAZOLAM HCL 2 MG/2ML IJ SOLN
INTRAMUSCULAR | Status: AC
  Filled 2015-09-04: qty 2

## 2015-09-04 MED ORDER — LIDOCAINE HCL (CARDIAC) 20 MG/ML IV SOLN
INTRAVENOUS | Status: AC
  Filled 2015-09-04: qty 5

## 2015-09-04 MED ORDER — LIDOCAINE HCL (CARDIAC) 20 MG/ML IV SOLN
INTRAVENOUS | Status: DC | PRN
  Administered 2015-09-04: 100 mg via INTRAVENOUS

## 2015-09-04 MED ORDER — ROCURONIUM BROMIDE 100 MG/10ML IV SOLN
INTRAVENOUS | Status: AC
  Filled 2015-09-04: qty 1

## 2015-09-04 MED ORDER — EPHEDRINE SULFATE 50 MG/ML IJ SOLN
INTRAMUSCULAR | Status: AC
  Filled 2015-09-04: qty 1

## 2015-09-04 MED ORDER — IBUPROFEN 800 MG PO TABS
800.0000 mg | ORAL_TABLET | Freq: Four times a day (QID) | ORAL | Status: DC
  Administered 2015-09-05 – 2015-09-08 (×10): 800 mg via ORAL
  Filled 2015-09-04 (×18): qty 1

## 2015-09-04 MED ORDER — OXYCODONE HCL 5 MG/5ML PO SOLN
5.0000 mg | Freq: Once | ORAL | Status: DC | PRN
  Filled 2015-09-04: qty 5

## 2015-09-04 MED ORDER — SODIUM CHLORIDE 0.9 % IJ SOLN
INTRAMUSCULAR | Status: AC
  Filled 2015-09-04: qty 20

## 2015-09-04 MED ORDER — ONDANSETRON HCL 4 MG/2ML IJ SOLN: 4.0000 mg | Freq: Four times a day (QID) | INTRAMUSCULAR | Status: DC | PRN

## 2015-09-04 MED ORDER — ATENOLOL 25 MG PO TABS
25.0000 mg | ORAL_TABLET | Freq: Every day | ORAL | Status: DC
  Administered 2015-09-05: 25 mg via ORAL
  Filled 2015-09-04: qty 1

## 2015-09-04 MED ORDER — ACETAMINOPHEN 500 MG PO TABS
1000.0000 mg | ORAL_TABLET | Freq: Four times a day (QID) | ORAL | Status: DC
  Administered 2015-09-04 – 2015-09-08 (×13): 1000 mg via ORAL
  Filled 2015-09-04 (×25): qty 2

## 2015-09-04 MED ORDER — TRAZODONE HCL 50 MG PO TABS
50.0000 mg | ORAL_TABLET | Freq: Every day | ORAL | Status: DC
  Administered 2015-09-04: 50 mg via ORAL
  Filled 2015-09-04 (×5): qty 1

## 2015-09-04 MED ORDER — ENOXAPARIN SODIUM 40 MG/0.4ML ~~LOC~~ SOLN
40.0000 mg | SUBCUTANEOUS | Status: DC
  Administered 2015-09-05 – 2015-09-08 (×4): 40 mg via SUBCUTANEOUS
  Filled 2015-09-04 (×5): qty 0.4

## 2015-09-04 MED ORDER — ONDANSETRON HCL 4 MG/2ML IJ SOLN
4.0000 mg | Freq: Four times a day (QID) | INTRAMUSCULAR | Status: DC | PRN
  Administered 2015-09-04: 4 mg via INTRAVENOUS
  Filled 2015-09-04: qty 2

## 2015-09-04 MED ORDER — SUCCINYLCHOLINE CHLORIDE 20 MG/ML IJ SOLN
INTRAMUSCULAR | Status: DC | PRN
  Administered 2015-09-04: 100 mg via INTRAVENOUS

## 2015-09-04 MED ORDER — ALBUTEROL SULFATE HFA 108 (90 BASE) MCG/ACT IN AERS
1.0000 | INHALATION_SPRAY | Freq: Four times a day (QID) | RESPIRATORY_TRACT | Status: DC | PRN
Start: 2015-09-04 — End: 2015-09-04

## 2015-09-04 MED ORDER — CITALOPRAM HYDROBROMIDE 40 MG PO TABS
40.0000 mg | ORAL_TABLET | Freq: Every day | ORAL | Status: DC
  Administered 2015-09-05 – 2015-09-08 (×4): 40 mg via ORAL
  Filled 2015-09-04 (×5): qty 1

## 2015-09-04 MED ORDER — HYDROMORPHONE HCL 1 MG/ML IJ SOLN
0.5000 mg | INTRAMUSCULAR | Status: AC | PRN
  Administered 2015-09-04 (×2): 0.5 mg via INTRAVENOUS
  Filled 2015-09-04 (×2): qty 1

## 2015-09-04 MED ORDER — CEFAZOLIN SODIUM-DEXTROSE 2-3 GM-% IV SOLR
2.0000 g | INTRAVENOUS | Status: AC
  Administered 2015-09-04: 2 g via INTRAVENOUS

## 2015-09-04 MED ORDER — GLUCERNA SHAKE PO LIQD
237.0000 mL | Freq: Three times a day (TID) | ORAL | Status: DC
  Administered 2015-09-04 – 2015-09-08 (×4): 237 mL via ORAL
  Filled 2015-09-04 (×13): qty 237

## 2015-09-04 MED ORDER — SUGAMMADEX SODIUM 500 MG/5ML IV SOLN
INTRAVENOUS | Status: DC | PRN
  Administered 2015-09-04: 300 mg via INTRAVENOUS

## 2015-09-04 MED ORDER — MIDAZOLAM HCL 5 MG/5ML IJ SOLN
INTRAMUSCULAR | Status: DC | PRN
  Administered 2015-09-04: 2 mg via INTRAVENOUS

## 2015-09-04 MED ORDER — TRAMADOL HCL 50 MG PO TABS
100.0000 mg | ORAL_TABLET | Freq: Four times a day (QID) | ORAL | Status: DC
  Administered 2015-09-04 – 2015-09-08 (×9): 100 mg via ORAL
  Filled 2015-09-04 (×11): qty 2

## 2015-09-04 MED ORDER — PROPOFOL 10 MG/ML IV BOLUS
INTRAVENOUS | Status: DC | PRN
  Administered 2015-09-04: 200 mg via INTRAVENOUS

## 2015-09-04 MED ORDER — 0.9 % SODIUM CHLORIDE (POUR BTL) OPTIME
TOPICAL | Status: DC | PRN
  Administered 2015-09-04: 4000 mL

## 2015-09-04 MED ORDER — PROMETHAZINE HCL 25 MG/ML IJ SOLN
INTRAMUSCULAR | Status: AC
  Filled 2015-09-04: qty 1

## 2015-09-04 MED ORDER — OXYCODONE HCL 5 MG PO TABS
10.0000 mg | ORAL_TABLET | ORAL | Status: DC | PRN
  Administered 2015-09-04 – 2015-09-05 (×2): 10 mg via ORAL
  Filled 2015-09-04 (×2): qty 2

## 2015-09-04 MED ORDER — CEFAZOLIN SODIUM-DEXTROSE 2-3 GM-% IV SOLR
INTRAVENOUS | Status: AC
  Filled 2015-09-04: qty 50

## 2015-09-04 MED ORDER — KCL IN DEXTROSE-NACL 20-5-0.45 MEQ/L-%-% IV SOLN
INTRAVENOUS | Status: DC
  Administered 2015-09-04 – 2015-09-06 (×2): via INTRAVENOUS
  Filled 2015-09-04 (×5): qty 1000

## 2015-09-04 MED ORDER — BUDESONIDE-FORMOTEROL FUMARATE 160-4.5 MCG/ACT IN AERO
2.0000 | INHALATION_SPRAY | Freq: Two times a day (BID) | RESPIRATORY_TRACT | Status: DC
  Administered 2015-09-04 – 2015-09-08 (×7): 2 via RESPIRATORY_TRACT
  Filled 2015-09-04: qty 6

## 2015-09-04 MED ORDER — FENTANYL CITRATE (PF) 250 MCG/5ML IJ SOLN
INTRAMUSCULAR | Status: AC
  Filled 2015-09-04: qty 5

## 2015-09-04 MED ORDER — PROMETHAZINE HCL 25 MG/ML IJ SOLN
6.2500 mg | INTRAMUSCULAR | Status: DC | PRN
  Administered 2015-09-04: 6.25 mg via INTRAVENOUS

## 2015-09-04 MED ORDER — LISINOPRIL 20 MG PO TABS
20.0000 mg | ORAL_TABLET | Freq: Every day | ORAL | Status: DC
  Administered 2015-09-05: 20 mg via ORAL
  Filled 2015-09-04 (×2): qty 1

## 2015-09-04 MED ORDER — ENOXAPARIN SODIUM 40 MG/0.4ML ~~LOC~~ SOLN
40.0000 mg | SUBCUTANEOUS | Status: AC
  Administered 2015-09-04: 40 mg via SUBCUTANEOUS
  Filled 2015-09-04: qty 0.4

## 2015-09-04 MED ORDER — SODIUM CHLORIDE 0.9 % IJ SOLN
INTRAMUSCULAR | Status: AC
  Filled 2015-09-04: qty 10

## 2015-09-04 MED ORDER — ATORVASTATIN CALCIUM 40 MG PO TABS
40.0000 mg | ORAL_TABLET | Freq: Every day | ORAL | Status: DC
  Administered 2015-09-05 – 2015-09-07 (×3): 40 mg via ORAL
  Filled 2015-09-04 (×5): qty 1

## 2015-09-04 MED ORDER — OXYCODONE HCL 5 MG PO TABS: 5.0000 mg | ORAL_TABLET | Freq: Once | ORAL | Status: DC | PRN

## 2015-09-04 SURGICAL SUPPLY — 38 items
ATTRACTOMAT 16X20 MAGNETIC DRP (DRAPES) ×2 IMPLANT
BLADE EXTENDED COATED 6.5IN (ELECTRODE) ×2 IMPLANT
CHLORAPREP W/TINT 26ML (MISCELLANEOUS) ×4 IMPLANT
CLIP TI MEDIUM LARGE 6 (CLIP) ×2 IMPLANT
CONT SPEC 4OZ CLIKSEAL STRL BL (MISCELLANEOUS) IMPLANT
COVER SURGICAL LIGHT HANDLE (MISCELLANEOUS) ×2 IMPLANT
DECANTER SPIKE VIAL GLASS SM (MISCELLANEOUS) ×2 IMPLANT
DRAPE INCISE IOBAN 66X45 STRL (DRAPES) ×4 IMPLANT
DRAPE WARM FLUID 44X44 (DRAPE) ×2 IMPLANT
DRSG OPSITE POSTOP 4X12 (GAUZE/BANDAGES/DRESSINGS) ×2 IMPLANT
ELECT REM PT RETURN 9FT ADLT (ELECTROSURGICAL) ×2
ELECTRODE REM PT RTRN 9FT ADLT (ELECTROSURGICAL) ×1 IMPLANT
GAUZE SPONGE 4X4 12PLY STRL (GAUZE/BANDAGES/DRESSINGS) IMPLANT
GAUZE SPONGE 4X4 16PLY XRAY LF (GAUZE/BANDAGES/DRESSINGS) ×2 IMPLANT
GLOVE BIO SURGEON STRL SZ 6 (GLOVE) ×4 IMPLANT
GLOVE BIO SURGEON STRL SZ 6.5 (GLOVE) ×4 IMPLANT
GOWN STRL REUS W/ TWL LRG LVL3 (GOWN DISPOSABLE) ×2 IMPLANT
GOWN STRL REUS W/TWL LRG LVL3 (GOWN DISPOSABLE) ×2
KIT BASIN OR (CUSTOM PROCEDURE TRAY) ×2 IMPLANT
LIGASURE IMPACT 36 18CM CVD LR (INSTRUMENTS) ×2 IMPLANT
NS IRRIG 1000ML POUR BTL (IV SOLUTION) ×4 IMPLANT
PACK GENERAL/GYN (CUSTOM PROCEDURE TRAY) ×2 IMPLANT
SHEET LAVH (DRAPES) ×2 IMPLANT
SPONGE LAP 18X18 X RAY DECT (DISPOSABLE) ×8 IMPLANT
STAPLER VISISTAT 35W (STAPLE) ×2 IMPLANT
SURGIFLO W/THROMBIN 8M KIT (HEMOSTASIS) ×6 IMPLANT
SUT PDS AB 1 TP1 96 (SUTURE) ×4 IMPLANT
SUT VIC AB 0 CT1 36 (SUTURE) ×10 IMPLANT
SUT VIC AB 2-0 CT1 36 (SUTURE) ×4 IMPLANT
SUT VIC AB 2-0 CT2 27 (SUTURE) ×12 IMPLANT
SUT VIC AB 2-0 SH 27 (SUTURE) ×3
SUT VIC AB 2-0 SH 27X BRD (SUTURE) ×3 IMPLANT
SUT VICRYL 2 0 18  UND BR (SUTURE)
SUT VICRYL 2 0 18 UND BR (SUTURE) IMPLANT
TOWEL OR 17X26 10 PK STRL BLUE (TOWEL DISPOSABLE) ×4 IMPLANT
TOWEL OR NON WOVEN STRL DISP B (DISPOSABLE) ×2 IMPLANT
TRAY FOLEY W/METER SILVER 14FR (SET/KITS/TRAYS/PACK) ×2 IMPLANT
TRAY FOLEY W/METER SILVER 16FR (SET/KITS/TRAYS/PACK) IMPLANT

## 2015-09-04 NOTE — Progress Notes (Signed)
Emptied patient's foley at [id number] and urine output was 100. Last time foley was emptied was at 1145 and urine output was 20. Called MD to report that patient's urinary output was <88ml/hr in a 6 hour time frame. MD made aware and stated that 30ml/hr was okay. Instructed to continue to monitor patient and report if urinary output drops below 23ml/hr. VSS. Will continue to monitor patient and will report to oncoming RN.

## 2015-09-04 NOTE — Anesthesia Preprocedure Evaluation (Addendum)
Anesthesia Evaluation  Patient identified by MRN, date of birth, ID band Patient awake    Reviewed: Allergy & Precautions, NPO status , Patient's Chart, lab work & pertinent test results  Airway Mallampati: II   Neck ROM: full    Dental   Pulmonary shortness of breath, asthma , sleep apnea ,    breath sounds clear to auscultation       Cardiovascular hypertension,  Rhythm:regular Rate:Normal     Neuro/Psych Anxiety Depression    GI/Hepatic   Endo/Other  diabetes, Type 2Morbid obesity  Renal/GU      Musculoskeletal  (+) Arthritis ,   Abdominal   Peds  Hematology   Anesthesia Other Findings   Reproductive/Obstetrics                            Anesthesia Physical Anesthesia Plan  ASA: III  Anesthesia Plan: General   Post-op Pain Management:    Induction: Intravenous  Airway Management Planned: Oral ETT  Additional Equipment:   Intra-op Plan:   Post-operative Plan: Extubation in OR  Informed Consent: I have reviewed the patients History and Physical, chart, labs and discussed the procedure including the risks, benefits and alternatives for the proposed anesthesia with the patient or authorized representative who has indicated his/her understanding and acceptance.     Plan Discussed with: CRNA, Anesthesiologist and Surgeon  Anesthesia Plan Comments:         Anesthesia Quick Evaluation

## 2015-09-04 NOTE — Anesthesia Procedure Notes (Signed)
Procedure Name: Intubation Date/Time: 09/04/2015 7:48 AM Performed by: Carleene Cooper A Pre-anesthesia Checklist: Timeout performed, Patient identified, Emergency Drugs available, Suction available and Patient being monitored Patient Re-evaluated:Patient Re-evaluated prior to inductionOxygen Delivery Method: Circle system utilized Preoxygenation: Pre-oxygenation with 100% oxygen Intubation Type: IV induction Laryngoscope Size: Mac and 4 Grade View: Grade I Tube type: Oral Tube size: 7.5 mm Number of attempts: 1 Airway Equipment and Method: Stylet Placement Confirmation: breath sounds checked- equal and bilateral,  ETT inserted through vocal cords under direct vision and positive ETCO2 Secured at: 21 cm Tube secured with: Tape Dental Injury: Teeth and Oropharynx as per pre-operative assessment

## 2015-09-04 NOTE — H&P (Signed)
H&P  Consult was originally requested by Dr. Carlena Bjornstad for the evaluation of Melissa Richards 49 y.o. female with a large left ovarian mass, elevated CA 125 and upper abdominal adenopathy  CC:  Chief Complaint  Patient presents with  . Cystic Ovarian Mass    New Consultation  . Elevated CA125    Assessment/Plan:  Melissa Richards is a 49 y.o. 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 She has a large left ovarian mass, elevated CA 125 ( 2782, chest and supraclvicular adenopathy, and adenopathy in the portal circulation concerning for metastatic disease. I personally reviewed her films from the CT scan from [facility] on 07/17/15.  She has had negative biopsy of the supraclavicular node, and biopsies of the pelvic mass and ascitic fluid suggestive of LMP vs low grade cancer. Therefore, due to inability to establish a cancer diagnosis, we will proceed with surgery.  I have discussed my suspicions for malignancy with the patient and her family.  I discussed that I was concerned that the lesion (4cm) at the portocaval nodal region is concerning unresectable metastatic disease and the chest nodules incompletely characterized on the CT abdo/pelvis may represent additional unresectable diseasee.   If malignancy is identified, she will require adjuvant chemotherapy.  She is scheduled for a planned exploratory laparotomy, BSO, omentectomy, radical tumor debulking.  We reviewed operative risks including  bleeding, infection, damage to internal organs (such as bladder,ureters, bowels), blood clot, reoperation and rehospitalization.  I discussed that surgical debulking efforts are associated with high rates of morbidity and potential need for GI and GU surgery, ostomy formation, blood transfusion, and major wound complications. I discussed that she is at increased risks for these morbidities and complications due to her underlying morbid obesity  (BMI 44kg/m2), diabetes mellitus, and obstructive sleep apnea and liklihood that suboptimal resection of her cancer will take place is also a result of her comorbidities.   HPI: Melissa Richards is a 49 year old G2P2 who is seen in consultation at the request of Dr Carlena Bjornstad for a large left ovarian mass and elevated CA 125.  The patient reports a 2 month history of increasing left sided abdominal discomfort and constipation.  She presented to the [facility] ER on 07/17/15 with symptoms of pelvic swelling, left lower back and abdominal pain. A CT of the abdomen and pelvis on 07/17/15 showed Bibasilar nodlarity , a subcarinal 1.4cm node suggestive of infrahilar adenopathy, multiple small retroperitoneal nodes (none pathologic by size criteria) including a 56mm aortocaval node and a 1.5cm gastrohepatic ligament node. There is a portacaval node measuring 2.5 x 3.7cm (image/series 21/2) and a 1.2cm right external iliac node.  There is soft tissue fullness extending superiorally off the right ovary (measuring 6x10cm) and a dominant left ovarian cystic mass arising from the pelvis measuring 18 x 22.5cm.  A CA 125 was drawn on 1019/16 and was elevated at 2782. (CEA was normal at 0.4).  She denies GI symptoms. She has never had a colonoscopy or EGD.  The patient has no family history concerning for hereditary cancer predisposition.  She has a medical history significant for morbid obesity, OSA, asthma and non-cardiac shortness of breath. She underwent coronary catheterization 2 years ago for this, which was negative for blockage, and she did not require stenting, or antiplatelet therapy. She was referred to pulmonology who diagnosed her as having asthma. She is a diabetic.  A Chest Ct on 07/30/15  Revealed: 1. Bilateral small pulmonary nodules  are concerning for pulmonary Metastasis. 2. Mild mediastinal and bilateral hilar adenopathy is also concerning for metastatic disease. 3. Mildly enlarged  LEFT supraclavicular lymph node is concerning for metastatic disease. This lymph node may be amenable to ultrasound-guided biopsy 4. Enlargement of the LEFT lobe of thyroid gland likely represents benign goiter.  Due to the findings of unresectable chest and upper abdominal disease, she was scheduled for neoadjuvant chemotherapy following histologic confirmation via biopsy.  Biopsy of the supraclavicular node on 08/05/15 revealed FOCAL CLUSTERS WITH GRANULOMAS, SEE COMMENT. LYMPHOID TISSUE PRESENT. NO MALIGNANT CELLS IDENTIFIED.  Paracentesis of the ascites and biopsy of the pelvic soft tissue mass on 08/18/15 revealed SEROUS TUMOR WITH FOCAL PROLIFERATION, with the ascites showing papillary fragments of serous tumor (at least LMP).  Due to these inconclusive findings for malignancy and a concern that this is a low malignant potential tumor which would not be responsive to chemotherapy, chemotherapy was cancelled and surgery was scheduled. If this disease is LMP, it is likely that the chest and upper abdominal adenopathy is non-malignant.   Current Meds:  Outpatient Encounter Prescriptions as of 07/28/2015  Medication Sig  . traMADol (ULTRAM) 50 MG tablet Take 1 tablet (50 mg total) by mouth every 6 (six) hours as needed.   No facility-administered encounter medications on file as of 07/28/2015.    Allergy:  Allergies  Allergen Reactions  . Hydrocodone-Acetaminophen Itching    Social Hx:  Social History   Social History  . Marital Status: Married    Spouse Name: N/A  . Number of Children: N/A  . Years of Education: N/A   Occupational History  . Not on file.   Social History Main Topics  . Smoking status: Never Smoker   . Smokeless tobacco: Never Used  . Alcohol Use: No  . Drug Use: No  . Sexual Activity: Not on file   Other Topics Concern  . Not on file   Social History Narrative  . No narrative on  file    Past Surgical Hx: History reviewed. No pertinent past surgical history.  Past Medical Hx:  Past Medical History  Diagnosis Date  . Arthritis   . Asthma   . Depression   . Diabetes mellitus without complication ([place])   . Hypertension     Past Gynecological History: SVD x 2 No LMP recorded.  Family Hx:  Family History  Problem Relation Age of Onset  . Anesthesia problems Mother   . Diabetes Mother   . Hypertension Mother   . Cancer Father   . Diabetes Brother     Review of Systems:  Constitutional  Feels generally uncomfortable,  ENT Normal appearing ears and nares bilaterally Skin/Breast  No rash, sores, jaundice, itching, dryness Cardiovascular  No chest pain, shortness of breath, or edema  Pulmonary  No cough or wheeze.  Gastro Intestinal  No nausea, vomitting, or diarrhoea. No bright red blood per rectum,+ abdominal pain, + change in bowel movement, + constipation.  Genito Urinary  No frequency, urgency, dysuria, no post menopausal bleeding Musculo Skeletal  No myalgia, arthralgia, joint swelling or pain  Neurologic  No weakness, numbness, change in gait,  Psychology  No depression, anxiety, insomnia.   Vitals: Blood pressure 157/98, pulse 112, temperature 98.1 F (36.7 C), temperature source Oral, resp. rate 19, height 5\' 4"  (1.626 m), weight 258 lb 8 oz (117.255 kg), SpO2 100 %.  Physical Exam: WD in NAD Neck  Supple NROM, without any enlargements.  Lymph Node Survey No cervical  supraclavicular or inguinal adenopathy Cardiovascular  Pulse normal rate, regularity and rhythm. S1 and S2 normal.  Lungs  Clear to auscultation bilateraly, without wheezes/crackles/rhonchi. Good air movement.  Skin  No rash/lesions/breakdown  Psychiatry  Alert and oriented to person, place, and time  Abdomen  Normoactive bowel sounds, abdomen soft, non-tender and obese with pannus without  evidence of hernia. Large cystic fullness extending into upper abdomen. Back No CVA tenderness Genito Urinary  Vulva/vagina: Normal external female genitalia. No lesions. No discharge or bleeding. Bladder/urethra: No lesions or masses, well supported bladder Vagina: grossly normal Cervix: Normal appearing, no lesions. Uterus: Small, mobile, no parametrial involvement or nodularity. Adnexa: unable to appreciate masses - she does have a pelvic-abdominal mass that is higher up in the pelvis (not low on the pelvic floor or in approximation with the distal rectum). Rectal  Good tone, no masses no cul de sac nodularity.  Extremities  No bilateral cyanosis, clubbing or edema.

## 2015-09-04 NOTE — Progress Notes (Signed)
CBG has not downloaded; was 149 preop

## 2015-09-04 NOTE — Op Note (Signed)
OPERATIVE NOTE 09/04/15  Preoperative Diagnosis: 1. 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 Suboptimal cytoreduction    Procedure(s) Performed: 1. Exploratory laparotomy with TAH, bilateral salpingo-oophorectomy, omentectomy radical tumor debulking for ovarian cancer. 22 modifier for extreme intraperitoneal obesity making surgical exposure more complex and increasing duration of procedure by 1 hour.  Surgeon: Thereasa Solo, MD.  Assistant Surgeon: Lahoma Crocker, M.D. Assistant: (an MD assistant was necessary for tissue manipulation, retraction and positioning due to the complexity of the case and hospital policies).   Specimens: Uterus, bilateral tubes / ovaries, omentum. Sigmoid colonic tumor.   Estimated Blood Loss: 200 mL.    Ascites: 3000cc  Urine Output:50cc  IVF: 0000000  Complications: None.   Operative Findings: 23cm left ovarian mass (with torsion), which showed carcinoma (clear cell vs serous). Right ovarian tumor measuring 10cm. 6cm calcified very hard nodule in sigmoid colon gutter adherent to the sigmoid mesentery. Grossly normal omentum Imaging preop had shown 4cm porta hepatis node (not able to be appreciated intraop due to extreme obesity).  No gross intraperitoneal residual disease at completion of procdure, though there is suspicious nodal disease in upper abdomen and chest, therefore she is considered suboptimally debulked.     Procedure:   The patient was seen in the Holding Room. The risks, benefits, complications, treatment options, and expected outcomes were discussed with the patient.  The patient concurred with the proposed plan, giving informed consent.   The patient was  identified as Melissa Richards  and the procedure verified as BSO, omentectomy, tumor debulking. A Time Out was held and the above information confirmed upon entry to the  operating room..  After induction of anesthesia, the patient was draped and prepped in the usual sterile manner.  She was prepped and draped in the normal sterile fashion in the dorsal lithotomy position in padded Allen stirrups with good attention paid to support of the lower back and lower extremities. Position was adjusted for appropriate support. A Foley catheter was placed to gravity.   A midline vertical incision was made and carried through the subcutaneous tissue to the fascia. The fascial incision was made and extended superiorally. The rectus muscles were separated. The peritoneum was identified and entered. Peritoneal incision was extended longitudinally.  The abdominal cavity was entered sharply and without incident. A Bookwalter retractor was then placed. A survey of the abdomen and pelvis revealed the above findings, which were significant for a large, torsed left ovarian mass, a smaller right complex ovarian mass, 3L ascites, sigmoid colon mesentery/gutter nodule.   After packing the small bowel into the upper abdomen, we performed the left salpingo-oophorectomy by entering the  pelvic sidewall just posterior to the left round ligament. The pararectal space was developed and the retroperitoneum developed up to the level of the common iliac artery.  The course of the ureter was identified with ease. The left IP was then skeletonized, and transected. The ovary was separated from its peritoneal attachments with the bovie with visualization of the ureter at all times. The ovary was separated from the torsed attachment to the utero-ovarian ligament using the ligasure. It was sent for frozen section which revealed malignancy (carcinoma).  Thececum was dissected from its peritoneal attachments to the left ovarian vessels using sharp dissection. The right retroperitoneal peritoneum was entered parallel to the IP ligament and the right ureter was identified in the right retroperitoneal space. Using  sharp and monopolar dissection, the right tube and ovary were freed from their peritoneal adhesions to the pelvis and IP ligament. The IP ligament was sealed with bipolar ligasure and transected. The right utero-ovarian ligament which was attenuated was then transected and sealed with ligasure.   The hysterectomy was performed by skeletonizing the uterine vessels posteriorally with sharp dissection and using the bovie to create a rectovaginal septum where inflammatory rind had stuck the rectum and utero-sacral ligaments to teh posterior uterus.  The bladder flap was created with meticulous short bursts of monopolar energy and gentle sharp and blunt dissection. The bladder was mobilized to below the level of the cervix. Tumor nodules were present on teh anterior bladder peritoneum and were included with the uterine specimen with the dissection.The uterine arteries were identified bilaterally at the uterine isthmus. THey were clamped with haeney clamps, cut and suture ligated. Successive passes of straight clamps medially along the cardinal ligmanets took place and these pedicles were sharply transected and suture ligated. The vaginal cuff corners were secured with 0-vicryl figure of 8 sutures. The vaginal cuff was amputated sharply at the cervicovaginal junction and closed with 0-vicryl figure of 8 sutures.  The omentum was dissected free from the transverse colon from the hepatic flexure to the splenic flexure using sharp metzenbaum scissor dissection. The lesser sac was entered. The infracolic omentum was separated from the transverse colon using ligasure to bipolar seal the omental vessels. Hemostasis was confirmed. The colon was closely inspected and was noted to be intact and hemostatic.  The left paracolic gutter was opened to evaluate the colonic (sigmoid) tumor). THe mass was rock hard, calcified and mobile. It appeared to be a metastasis from the gutter that was infiltrating into the mesentery but  not bowel wall. Using sharp dissection and short bursts of monopolar dissection on vessels, it was shelled out of its attachments to the colonic mesentery on the left with no palpable or visible residual tumor at this location left behind.  The peritoneal cavity was irrigated and hemostasis was confirmed at all surgical sites.  There was no visible or palpable intraperitoneal residual tumor was present at the completion of the surgery.   The fascia was reapproximated with 0 looped PDS using a total of two sutures. The subcutaneous fat layer was closed in the dead space with 2-0 vicryl. Then this layer was then irrigated copiously.  Exparel long acting local anesthetic was infiltrated into the subcutaneous tissues. The skin was closed with staples. The patient tolerated the procedure well.   Sponge, lap and needle counts were correct x 2.   Donaciano Eva, MD

## 2015-09-04 NOTE — Anesthesia Postprocedure Evaluation (Signed)
Anesthesia Post Note  Patient: [name]  Procedure(s) Performed: Procedure(s) (LRB): HYSTERECTOMY TOTAL ABDOMINAL, EXPLORATORY LAPAROTOMY, BILATERAL SALPINGO OOPHERECTOMY, OMENTECTOMY, DEBULKING (Bilateral)  Patient location during evaluation: PACU Anesthesia Type: General Level of consciousness: awake and alert and patient cooperative Pain management: pain level controlled Vital Signs Assessment: post-procedure vital signs reviewed and stable Respiratory status: spontaneous breathing and respiratory function stable Cardiovascular status: stable Anesthetic complications: no    Last Vitals:  Filed Vitals:   09/04/15 1115 09/04/15 1130  BP: 116/95 119/94  Pulse: 81 82  Temp:    Resp: 11 15    Last Pain:  Filed Vitals:   09/04/15 1138  PainSc: [place]

## 2015-09-04 NOTE — Transfer of Care (Signed)
Immediate Anesthesia Transfer of Care Note  Patient: Melissa Richards  Procedure(s) Performed: Procedure(s): HYSTERECTOMY TOTAL ABDOMINAL, EXPLORATORY LAPAROTOMY, BILATERAL SALPINGO OOPHERECTOMY, OMENTECTOMY, DEBULKING (Bilateral)  Patient Location: PACU  Anesthesia Type:General  Level of Consciousness: awake, alert , oriented and patient cooperative  Airway & Oxygen Therapy: Patient Spontanous Breathing and Patient connected to face mask oxygen  Post-op Assessment: Report given to RN, Post -op Vital signs reviewed and stable and Patient moving all extremities  Post vital signs: Reviewed and stable  Last Vitals:  Filed Vitals:   09/04/15 0646  BP: 151/95  Pulse: 89  Temp: 36.5 C  Resp: 16    Complications: No apparent anesthesia complications

## 2015-09-05 LAB — BASIC METABOLIC PANEL
ANION GAP: 6 (ref 5–15)
BUN: 11 mg/dL (ref 6–20)
CHLORIDE: 101 mmol/L (ref 101–111)
CO2: 28 mmol/L (ref 22–32)
Calcium: 8.3 mg/dL — ABNORMAL LOW (ref 8.9–10.3)
Creatinine, Ser: 0.82 mg/dL (ref 0.44–1.00)
Glucose, Bld: 196 mg/dL — ABNORMAL HIGH (ref 65–99)
POTASSIUM: 4.4 mmol/L (ref 3.5–5.1)
SODIUM: 135 mmol/L (ref 135–145)

## 2015-09-05 LAB — CBC
HCT: 33.1 % — ABNORMAL LOW (ref 36.0–46.0)
HEMOGLOBIN: 10.6 g/dL — AB (ref 12.0–15.0)
MCH: 25.9 pg — AB (ref 26.0–34.0)
MCHC: 32 g/dL (ref 30.0–36.0)
MCV: 80.9 fL (ref 78.0–100.0)
PLATELETS: 468 10*3/uL — AB (ref 150–400)
RBC: 4.09 MIL/uL (ref 3.87–5.11)
RDW: 14.7 % (ref 11.5–15.5)
WBC: 20.6 10*3/uL — AB (ref 4.0–10.5)

## 2015-09-05 MED ORDER — SODIUM CHLORIDE 0.9 % IV BOLUS (SEPSIS)
250.0000 mL | Freq: Once | INTRAVENOUS | Status: AC
  Administered 2015-09-05: 250 mL via INTRAVENOUS

## 2015-09-05 MED ORDER — METFORMIN HCL 500 MG PO TABS
500.0000 mg | ORAL_TABLET | Freq: Every day | ORAL | Status: DC
  Administered 2015-09-05 – 2015-09-08 (×4): 500 mg via ORAL
  Filled 2015-09-05 (×6): qty 1

## 2015-09-05 MED ORDER — HYDROMORPHONE HCL 2 MG PO TABS
2.0000 mg | ORAL_TABLET | ORAL | Status: DC | PRN
  Administered 2015-09-05 (×3): 2 mg via ORAL
  Filled 2015-09-05 (×3): qty 1

## 2015-09-05 MED ORDER — SODIUM CHLORIDE 0.9 % IV BOLUS (SEPSIS)
500.0000 mL | Freq: Once | INTRAVENOUS | Status: AC
Start: 2015-09-05 — End: 2015-09-06
  Administered 2015-09-05: 500 mL via INTRAVENOUS

## 2015-09-05 MED ORDER — SODIUM CHLORIDE 0.9 % IV BOLUS (SEPSIS)
500.0000 mL | Freq: Once | INTRAVENOUS | Status: AC
  Administered 2015-09-05: 500 mL via INTRAVENOUS

## 2015-09-05 MED ORDER — HYDROMORPHONE HCL 1 MG/ML IJ SOLN
0.5000 mg | INTRAMUSCULAR | Status: DC | PRN
  Administered 2015-09-05 (×2): 0.5 mg via INTRAVENOUS
  Filled 2015-09-05 (×2): qty 1

## 2015-09-05 MED ORDER — GABAPENTIN 300 MG PO CAPS
600.0000 mg | ORAL_CAPSULE | Freq: Three times a day (TID) | ORAL | Status: DC
  Administered 2015-09-05 (×2): 600 mg via ORAL
  Filled 2015-09-05 (×3): qty 2

## 2015-09-05 NOTE — Progress Notes (Signed)
1 Day Post-Op Procedure(s) (LRB): HYSTERECTOMY TOTAL ABDOMINAL, EXPLORATORY LAPAROTOMY, BILATERAL SALPINGO OOPHERECTOMY, OMENTECTOMY, DEBULKING (Bilateral)  Subjective: Patient reports having a rough evening last pm due to moderate pain.  Doing better this am.  Stating the oxycodone did not assist with pain relief and caused nausea.  Up with assistance.  Diet tolerated this am with no nausea or emesis.  Stating her urine output was low but she had almost 600 cc out.  Denies chest pain, dyspnea, passing flatus, having a bowel movement.  No concerns voiced.      Objective: Vital signs in last 24 hours: Temp:  [97.5 F (36.4 C)-98.4 F (36.9 C)] 98.1 F (36.7 C) (12/02 0939) Pulse Rate:  [80-92] 92 (12/02 0939) Resp:  [14-18] 16 (12/02 0723) BP: (113-150)/(82-92) 126/92 mmHg (12/02 0939) SpO2:  [97 %-100 %] 100 % (12/02 0819) Last BM Date: 09/03/15  Intake/Output from previous day: 12/01 0701 - 12/02 0700 In: 4010.8 [P.O.:120; I.V.:3890.8] Out: [id number] [Urine:595; Emesis/NG output:320; Blood:250]  Physical Examination: General: alert, cooperative and no distress Resp: clear to auscultation bilaterally Cardio: regular rate and rhythm, S1, S2 normal, no murmur, click, rub or gallop GI: incision: midline incision with honeycomb dressing intact with no evidence of active bleeding or drainage and abdomen obese, hypoactive bowel sounds, soft, non-tympanic Extremities: No edema, cyanosis, or clubbing   Urine slightly pink in the tubing and foley bag  Labs: WBC/Hgb/Hct/Plts:  20.6/10.6/33.1/468 (12/02 0428) BUN/Cr/glu/ALT/AST/amyl/lip:  11/0.82/--/--/--/--/-- (12/02 0428)  Assessment: 49 y.o. s/p Procedure(s): HYSTERECTOMY TOTAL ABDOMINAL, EXPLORATORY LAPAROTOMY, BILATERAL SALPINGO OOPHERECTOMY, OMENTECTOMY, DEBULKING: stable Pain:  Pain is well-controlled on PRN medications.  Heme:  Hgb 10.6 and Hct 33.1.  Stable post-operatively.  CV: BP and HR stable post-operatively.  Continue to  monitor with ordered vital sign assessments.  GI:  Tolerating po: Yes.  Zofran ordered PRN if needed.  GU: Urine pink.  Adequate output reported (595cc).  FEN: Stable post-operatively.  Prophylaxis: pharmacologic prophylaxis (with any of the following: enoxaparin (Lovenox) 40mg  SQ 2 hours prior to surgery then every day) and intermittent pneumatic compression boots.  Plan: IV to saline lock if diet tolerated Switch foley tubing/bag with new to see if urine is clearing up.  If urine clear/yellow, discontinue foley per Dr. Delsa Sale Repeat CBC in the am Advance diet as tolerated Encourage ambulation, IS use, deep breathing, and coughing Continue post-operative plan of care per Dr. Delsa Sale    LOS: 1 day    Melissa Richards DEAL 09/05/2015, 11:47 AM  Pager [phone]

## 2015-09-05 NOTE — Progress Notes (Signed)
C/o a lot of pain to the abdomen.  Stated no pills working for her at this time. Had to call MD for dilaudid IV.  Orders given and patient received medication with relief.  Patient then stated she did not want any oral pain meds she would rather wait for IV dilaudid only because the pills don't work.  It was explained that the preferred method is oral analgesic but she refused.   Roland Rack, RN, 12/2/21016  [phone]

## 2015-09-05 NOTE — Progress Notes (Signed)
Spoke with Elinor Parkinson, NP about pt low urine output. Since 0700 this morning pt has only put out 135cc. Pt has been encouraged to increase PO intake however urine output has not increased. Per MD will restart maintenance IV fluids and continue to monitor pt. Also per MD Foley catheter tube change and urine output no longer looks pink tinged but is now amber and cloudy. Foley to remain in place for now. Will continue to follow up as needed.

## 2015-09-06 ENCOUNTER — Inpatient Hospital Stay (HOSPITAL_COMMUNITY): Payer: BLUE CROSS/BLUE SHIELD

## 2015-09-06 LAB — BASIC METABOLIC PANEL
ANION GAP: 8 (ref 5–15)
Anion gap: 6 (ref 5–15)
BUN: 15 mg/dL (ref 6–20)
BUN: 13 mg/dL (ref 6–20)
CHLORIDE: 94 mmol/L — AB (ref 101–111)
CO2: 29 mmol/L (ref 22–32)
CO2: 28 mmol/L (ref 22–32)
CREATININE: 0.81 mg/dL (ref 0.44–1.00)
Calcium: 7.9 mg/dL — ABNORMAL LOW (ref 8.9–10.3)
Calcium: 8 mg/dL — ABNORMAL LOW (ref 8.9–10.3)
Chloride: 99 mmol/L — ABNORMAL LOW (ref 101–111)
Creatinine, Ser: 0.79 mg/dL (ref 0.44–1.00)
GFR calc Af Amer: >60 mL/min (ref >60)
GFR calc Af Amer: >60 mL/min (ref >60)
GFR calc non Af Amer: >60 mL/min (ref >60)
Glucose, Bld: 130 mg/dL — ABNORMAL HIGH (ref 65–99)
Glucose, Bld: 93 mg/dL (ref 65–99)
POTASSIUM: 3.9 mmol/L (ref 3.5–5.1)
Potassium: 4.1 mmol/L (ref 3.5–5.1)
SODIUM: 129 mmol/L — AB (ref 135–145)
SODIUM: 135 mmol/L (ref 135–145)

## 2015-09-06 LAB — URINALYSIS, ROUTINE W REFLEX MICROSCOPIC
BILIRUBIN URINE: NEGATIVE
Glucose, UA: NEGATIVE mg/dL
Ketones, ur: NEGATIVE mg/dL
Nitrite: NEGATIVE
PH: 6 (ref 5.0–8.0)
Protein, ur: NEGATIVE mg/dL
SPECIFIC GRAVITY, URINE: 1.023 (ref 1.005–1.030)

## 2015-09-06 LAB — CBC
HCT: 28.3 % — ABNORMAL LOW (ref 36.0–46.0)
HEMATOCRIT: 26.9 % — AB (ref 36.0–46.0)
HEMOGLOBIN: 8.3 g/dL — AB (ref 12.0–15.0)
Hemoglobin: 8.7 g/dL — ABNORMAL LOW (ref 12.0–15.0)
MCH: 25.4 pg — ABNORMAL LOW (ref 26.0–34.0)
MCH: 25.4 pg — AB (ref 26.0–34.0)
MCHC: 30.7 g/dL (ref 30.0–36.0)
MCHC: 30.9 g/dL (ref 30.0–36.0)
MCV: 82.5 fL (ref 78.0–100.0)
MCV: 82.3 fL (ref 78.0–100.0)
PLATELETS: 387 10*3/uL (ref 150–400)
Platelets: 398 10*3/uL (ref 150–400)
RBC: 3.43 MIL/uL — AB (ref 3.87–5.11)
RBC: 3.27 MIL/uL — AB (ref 3.87–5.11)
RDW: 14.8 % (ref 11.5–15.5)
RDW: 14.8 % (ref 11.5–15.5)
WBC: 17.7 10*3/uL — AB (ref 4.0–10.5)
WBC: 12.2 10*3/uL — AB (ref 4.0–10.5)

## 2015-09-06 LAB — GLUCOSE, CAPILLARY
Glucose-Capillary: 100 mg/dL — ABNORMAL HIGH (ref 65–99)
Glucose-Capillary: 97 mg/dL (ref 65–99)

## 2015-09-06 LAB — URINE MICROSCOPIC-ADD ON: BACTERIA UA: NONE SEEN

## 2015-09-06 LAB — LACTIC ACID, PLASMA: Lactic Acid, Venous: 2.4 mmol/L (ref 0.5–2.0)

## 2015-09-06 MED ORDER — SODIUM CHLORIDE 0.9 % IV SOLN
INTRAVENOUS | Status: DC
  Administered 2015-09-06 – 2015-09-07 (×3): via INTRAVENOUS

## 2015-09-06 NOTE — Progress Notes (Signed)
Spoke with Dr. Delsa Sale, regarding Patients low Blood Pressure and low urine output.  BP 75/50.  Ordered to give 500 cc bolus of NS and place indwelling follow if pt has  no output.  Pt. Had difficulty voiding, even assisted several times to bathroom and she put out only dribbles,   Bladder scanner showed only 53 cc;  Nurse placed foley cath and immediately 400 cc urine output.  Also BP was elevated to 93/56 with 78 pulse.  Labs of CBC and hgb was 8.5.  This morning.  Will continue to monitor patient tilll morning.  Pt has showed  Symptoms noted.

## 2015-09-06 NOTE — Progress Notes (Signed)
CRITICAL VALUE ALERT  Critical value received:  Lactic acid 2.4  Date of notification: 09/06/15  Time of notification: 1310  Critical value read back:Yes.    Nurse who received alert:  VW, rn.  MD notified (1st page): Dr. Delsa Sale  Time of first page:  1317 MD notified (2nd page): Dr. Delsa Sale  Time of second page:1334  Responding MD:  Dr. Delsa Sale  Time MD responded: 1340. Contacted MD by phone. Orders had already been placed by MD prior to telephone encounter.

## 2015-09-06 NOTE — Progress Notes (Signed)
Patient ID: Tomasita Tabin, female   DOB: 1966/06/02, 49 y.o.   MRN: [id number] 2 Days Post-Op Procedure(s) (LRB): HYSTERECTOMY TOTAL ABDOMINAL, EXPLORATORY LAPAROTOMY, BILATERAL SALPINGO OOPHERECTOMY, OMENTECTOMY, DEBULKING (Bilateral)  Subjective:  Denies chest pain, dyspnea, passing flatus, having flatus or bowel movement.  No concerns voiced.      Objective: Vital signs in last 24 hours: Temp:  [97.5 F (36.4 C)-98 F (36.7 C)] 97.6 F (36.4 C) (12/03 0600) Pulse Rate:  [50-82] 78 (12/03 0600) Resp:  [14-100] 16 (12/03 0600) BP: (75-107)/(41-67) 82/53 mmHg (12/03 0600) SpO2:  [91 %-98 %] 93 % (12/03 0940) Last BM Date: 09/03/15  Intake/Output from previous day: 12/02 0701 - 12/03 0700 In: 1448.3 [P.O.:360; I.V.:1088.3] Out: 960 [Urine:960]  Physical Examination: General: alert, cooperative and no distress Resp: clear to auscultation bilaterally; rale at bases bilaterally Cardio: regular rate and rhythm, S1, S2 normal, no murmur, click, rub or gallop GI: incision: midline incision with honeycomb dressing intact with no evidence of active bleeding or drainage and abdomen obese, hypoactive bowel sounds, soft, non-tympanic Extremities: No edema, cyanosis, or clubbing    CBG (last 3)   Recent Labs  09/04/15 1606 09/04/15 1753 09/04/15 2135  GLUCAP 192* 162* 178*     Labs: WBC/Hgb/Hct/Plts:  17.7/8.7/28.3/387 (12/03 0136)    Assessment: 49 y.o. s/p Procedure(s): HYSTERECTOMY TOTAL ABDOMINAL, EXPLORATORY LAPAROTOMY, BILATERAL SALPINGO OOPHERECTOMY, OMENTECTOMY, DEBULKING: stable Pain:  Pain is well-controlled on PRN medications.  Heme:  Hgb 8.7.  Slight downward trend; doubt active bleeding  CV: Hypotension.  HR stable.  Likely secondary intravascular volume depletion  GI:  Tolerating po: Yes.    GU: Urinary retention.  Urine output at 0.4 ml/kg/hr.  FEN: Hypovolemia  Endo:  Type 2 DM.  CBGs < 200  ID:  WBC 17.7.  The adnexa had undergone a torsion/necrotic.   SIRS picture less likely  Prophylaxis: pharmacologic prophylaxis (with any of the following: enoxaparin (Lovenox) 40mg  SQ 2 hours prior to surgery then every day) and intermittent pneumatic compression boots.  Plan: Continue foley drainage today Continue IVF/volume resuscitation Recheck CBC/lytes/lactic acid EKG U/A, urine C&S CXR Encourage ambulation, IS use, deep breathing, and coughing     LOS: 2 days    JACKSON-MOORE,Alexes Menchaca A 09/06/2015, 10:15 AM  Pager [phone]

## 2015-09-07 LAB — GLUCOSE, CAPILLARY
GLUCOSE-CAPILLARY: 143 mg/dL — AB (ref 65–99)
GLUCOSE-CAPILLARY: 108 mg/dL — AB (ref 65–99)
GLUCOSE-CAPILLARY: 120 mg/dL — AB (ref 65–99)
GLUCOSE-CAPILLARY: 118 mg/dL — AB (ref 65–99)
Glucose-Capillary: 140 mg/dL — ABNORMAL HIGH (ref 65–99)
Glucose-Capillary: 127 mg/dL — ABNORMAL HIGH (ref 65–99)
Glucose-Capillary: 146 mg/dL — ABNORMAL HIGH (ref 65–99)
Glucose-Capillary: 131 mg/dL — ABNORMAL HIGH (ref 65–99)
Glucose-Capillary: 91 mg/dL (ref 65–99)
Glucose-Capillary: 107 mg/dL — ABNORMAL HIGH (ref 65–99)

## 2015-09-07 MED ORDER — BISACODYL 10 MG RE SUPP
10.0000 mg | Freq: Every day | RECTAL | Status: DC | PRN
  Administered 2015-09-07: 10 mg via RECTAL
  Filled 2015-09-07: qty 1

## 2015-09-07 NOTE — Progress Notes (Addendum)
Patient ID: Melissa Richards, female   DOB: 1966/07/01, 49 y.o.   MRN: [id number] 3 Days Post-Op Procedure(s) (LRB): HYSTERECTOMY TOTAL ABDOMINAL, EXPLORATORY LAPAROTOMY, BILATERAL SALPINGO OOPHERECTOMY, OMENTECTOMY, DEBULKING (Bilateral)  Subjective:  Denies chest pain, dyspnea.  Small flatus yesterday--no BM     Objective: Vital signs in last 24 hours: Temp:  [94.6 F (34.8 C)-98 F (36.7 C)] 98 F (36.7 C) (12/04 0700) Pulse Rate:  [82-95] 95 (12/04 0700) Resp:  [16] 16 (12/04 0700) BP: (91-107)/(45-61) 103/54 mmHg (12/04 0700) SpO2:  [98 %-100 %] 100 % (12/04 0700) Last BM Date: 09/03/15  Intake/Output from previous day: 12/03 0701 - 12/04 0700 In: 3740 [P.O.:120; I.V.:3620] Out: 2250 [Urine:2250]  Physical Examination: General: alert, cooperative and no distress Resp: clear to auscultation bilaterally; rale at bases bilaterally Cardio: regular rate and rhythm, S1, S2 normal, no murmur, click, rub or gallop GI: incision: midline incision with honeycomb dressing intact with no evidence of active bleeding or drainage and abdomen obese, normoactive bowel sounds, soft, slightly tympanic Extremities: No edema, cyanosis, or clubbing    CBG (last 3)   Recent Labs  09/04/15 2135 09/06/15 1618 09/06/15 2221  GLUCAP 178* 100* 97     Labs: WBC/Hgb/Hct/Plts:  12.2/8.3/26.9/398 (12/03 1050) BUN/Cr/glu/ALT/AST/amyl/lip:  13/0.79/--/--/--/--/-- (12/03 1700)  Assessment: 49 y.o. s/p Procedure(s): HYSTERECTOMY TOTAL ABDOMINAL, EXPLORATORY LAPAROTOMY, BILATERAL SALPINGO OOPHERECTOMY, OMENTECTOMY, DEBULKING: stable Pain:  Pain is well-controlled on PRN medications.  Heme:  Hgb 8.3.  Stable  CV: Hypotension--improving.  HR stable.    GI:  Tolerating po: Yes.    GU: Urinary retention.  Urine output at 0.8 ml/kg/hr.   FEN: Volume status improved  Endo:  Type 2 DM.  CBGs < 200  ID:  WBC 12.2.  Afebrile--doubt infectious process   Pulm: Atelectasis on CXR.  Status  stable  Prophylaxis: pharmacologic prophylaxis (with any of the following: enoxaparin (Lovenox) 40mg  SQ 2 hours prior to surgery then every day) and intermittent pneumatic compression boots.  Plan: Voiding trial Dulcolax suppository Encourage ambulation, IS use, deep breathing, and coughing     LOS: 3 days    JACKSON-MOORE,Chaston Bradburn A 09/07/2015, 10:32 AM  Pager [phone]

## 2015-09-08 ENCOUNTER — Ambulatory Visit: Payer: BLUE CROSS/BLUE SHIELD | Admitting: Oncology

## 2015-09-08 ENCOUNTER — Other Ambulatory Visit: Payer: BLUE CROSS/BLUE SHIELD

## 2015-09-08 LAB — URINE CULTURE: CULTURE: NO GROWTH

## 2015-09-08 LAB — GLUCOSE, CAPILLARY: Glucose-Capillary: 98 mg/dL (ref 65–99)

## 2015-09-08 MED ORDER — ENOXAPARIN SODIUM 40 MG/0.4ML ~~LOC~~ SOLN: 40.0000 mg | SUBCUTANEOUS | Status: DC

## 2015-09-08 MED ORDER — ENOXAPARIN (LOVENOX) PATIENT EDUCATION KIT
PACK | Freq: Once | Status: AC
  Administered 2015-09-08: 11:00:00
  Filled 2015-09-08: qty 1

## 2015-09-08 MED ORDER — HYDROMORPHONE HCL 2 MG PO TABS: 2.0000 mg | ORAL_TABLET | ORAL | Status: DC | PRN

## 2015-09-08 NOTE — Discharge Instructions (Signed)
09/08/2015  Return to work: 6 weeks  Activity: 1. Be up and out of the bed during the day.  Take a nap if needed.  You may walk up steps but be careful and use the hand rail.  Stair climbing will tire you more than you think, you may need to stop part way and rest.   2. No lifting or straining for 6 weeks.  3. No driving for 2 weeks.  Do Not drive if you are taking narcotic pain medicine.  4. Shower daily.  Use soap and water on your incision and pat dry; don't rub.   5. No sexual activity and nothing in the vagina for 6 weeks.  Diet: 1. Low sodium Heart Healthy Diet is recommended.  2. It is safe to use a laxative if you have difficulty moving your bowels.   Wound Care: 1. Keep clean and dry.  Shower daily.  Reasons to call the Doctor:   Fever - Oral temperature greater than 100.4 degrees Fahrenheit  Foul-smelling vaginal discharge  Difficulty urinating  Nausea and vomiting  Increased pain at the site of the incision that is unrelieved with pain medicine.  Difficulty breathing with or without chest pain  New calf pain especially if only on one side  Sudden, continuing increased vaginal bleeding with or without clots.   Follow-up: 1. See [name] in 3 weeks. 2. Return to clinic in 1 week for staple removal  Contacts: For questions or concerns you should contact:  Dr. [name] at [phone]  or at [place]

## 2015-09-08 NOTE — Progress Notes (Signed)
Melissa Richards to be D/C'd Home per MD order.  Discussed prescriptions and follow up appointments with the patient. Prescriptions given to patient, medication list explained in detail. Pt verbalized understanding.    Medication List    STOP taking these medications        oxyCODONE 5 MG immediate release tablet  Commonly known as:  ROXICODONE      TAKE these medications        albuterol 108 (90 BASE) MCG/ACT inhaler  Commonly known as:  PROVENTIL HFA;VENTOLIN HFA  Inhale 1-2 puffs into the lungs every 6 (six) hours as needed for wheezing or shortness of breath.     atenolol 25 MG tablet  Commonly known as:  TENORMIN  Take 25 mg by mouth daily.     atorvastatin 40 MG tablet  Commonly known as:  LIPITOR  Take 40 mg by mouth daily at 6 PM.     budesonide-formoterol 160-4.5 MCG/ACT inhaler  Commonly known as:  SYMBICORT  Inhale 2 puffs into the lungs 2 (two) times daily.     busPIRone 15 MG tablet  Commonly known as:  BUSPAR  Take 15 mg by mouth 2 (two) times daily.     citalopram 40 MG tablet  Commonly known as:  CELEXA  Take 40 mg by mouth daily.     dexamethasone 4 MG tablet  Commonly known as:  DECADRON  Take 5 tablets with food(=20 mg) 12 hrs and 6 hrs prior to Taxol chemotherapy     enoxaparin 40 MG/0.4ML injection  Commonly known as:  LOVENOX  Inject 0.4 mLs (40 mg total) into the skin daily.     HYDROmorphone 2 MG tablet  Commonly known as:  DILAUDID  Take 1-2 tablets (2-4 mg total) by mouth every 4 (four) hours as needed for severe pain.     lisinopril 20 MG tablet  Commonly known as:  PRINIVIL,ZESTRIL  Take 20 mg by mouth daily.     LORazepam 1 MG tablet  Commonly known as:  ATIVAN  Place 1 tablet under the tongue or swallow every 6 hrs as needed for nausea. Will make drowsy. Take night of chemo whether or not nauseous. Do not take close to trazadone.     metFORMIN 500 MG tablet  Commonly known as:  GLUCOPHAGE  Take 500 mg by mouth daily with breakfast.      multivitamin with minerals Tabs tablet  Take 1 tablet by mouth daily.     ondansetron 8 MG tablet  Commonly known as:  ZOFRAN  Take 1 tablet (8 mg total) by mouth every 8 (eight) hours as needed for nausea. Will not make drowsy.     promethazine 25 MG tablet  Commonly known as:  PHENERGAN  Take 1 tablet (25 mg total) by mouth every 6 (six) hours as needed for nausea or vomiting.     traMADol 50 MG tablet  Commonly known as:  ULTRAM  Take 1 tablet (50 mg total) by mouth every 6 (six) hours as needed.     traZODone 50 MG tablet  Commonly known as:  DESYREL  Take 50 mg by mouth at bedtime.        Filed Vitals:   09/08/15 0602 09/08/15 1103  BP: 93/80 152/82  Pulse: 86 96  Temp: 98.4 F (36.9 C) 97.9 F (36.6 C)  Resp: 16 18    Skin clean, dry and intact without evidence of skin break down, no evidence of skin tears noted. IV catheter discontinued intact.  Site without signs and symptoms of complications. Dressing and pressure applied. Pt denies pain at this time. No complaints noted.  An After Visit Summary was printed and given to the patient. Patient escorted via [place], and D/C home via private auto.  Nonie Hoyer S 09/08/2015 11:27 AM

## 2015-09-08 NOTE — Discharge Summary (Signed)
Physician Discharge Summary  Patient ID: Melissa Richards MRN: [id number] DOB/AGE: 05-08-1966 49 y.o.  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 Pathology pending. Postoperatively she developed delayed return of bowel function but was passing flatus and BM's on POD 4 and tolerating a regular diet.  Consults: None  Significant Diagnostic Studies: labs:  CBC    Component Value Date/Time   WBC 12.2* 09/06/2015 1050   WBC 11.2* 08/07/2015 1151   RBC 3.27* 09/06/2015 1050   RBC 4.74 08/07/2015 1151   HGB 8.3* 09/06/2015 1050   HGB 12.3 08/07/2015 1151   HCT 26.9* 09/06/2015 1050   HCT 38.4 08/07/2015 1151   PLT 398 09/06/2015 1050   PLT 411* 08/07/2015 1151   MCV 82.3 09/06/2015 1050   MCV 81.1 08/07/2015 1151   MCH 25.4* 09/06/2015 1050   MCH 26.0 08/07/2015 1151   MCHC 30.9 09/06/2015 1050   MCHC 32.0 08/07/2015 1151   RDW 14.8 09/06/2015 1050   RDW 13.8 08/07/2015 1151   LYMPHSABS 0.7 08/26/2015 1200   LYMPHSABS 0.7* 08/07/2015 1151   MONOABS 1.1* 08/26/2015 1200   MONOABS 0.8 08/07/2015 1151   EOSABS 0.1 08/26/2015 1200   EOSABS 0.2 08/07/2015 1151   BASOSABS 0.1 08/26/2015 1200   BASOSABS 0.1 08/07/2015 1151      Treatments: surgery: see above  Discharge Exam: Blood pressure 93/80, pulse 86, temperature 98.4 F (36.9 C), temperature source Oral, resp. rate 16, height 5\' 2"  (1.575 m), weight 249 lb (112.946 kg), SpO2 98 %. General appearance: alert and cooperative Resp: clear to auscultation bilaterally Cardio: regular rate and rhythm, S1, S2 normal, no murmur, click, rub or gallop GI: soft, non-tender; bowel sounds normal; no masses,  no  organomegaly Incision/Wound: clean, dry, with staples, see-through dressing with no peri-incisional erythema or drainage.  Disposition: 01-Home or Self Care  Discharge Instructions    Call MD for:  difficulty breathing, headache or visual disturbances    Complete by:  As directed      Call MD for:  extreme fatigue    Complete by:  As directed      Call MD for:  hives    Complete by:  As directed      Call MD for:  persistant dizziness or light-headedness    Complete by:  As directed      Call MD for:  persistant nausea and vomiting    Complete by:  As directed      Call MD for:  redness, tenderness, or signs of infection (pain, swelling, redness, odor or green/yellow discharge around incision site)    Complete by:  As directed      Call MD for:  severe uncontrolled pain    Complete by:  As directed      Call MD for:  temperature >100.4    Complete by:  As directed      Diet - low sodium heart healthy    Complete by:  As directed      Driving Restrictions    Complete by:  As directed   No driving for 2 weeks from surgery.  Do not take narcotics and drive.     Increase activity slowly    Complete  by:  As directed      Lifting restrictions    Complete by:  As directed   No lifting greater than 10 lbs.     Sexual Activity Restrictions    Complete by:  As directed   No sexual activity, nothing in the vagina, for 6 weeks.            Medication List    STOP taking these medications        oxyCODONE 5 MG immediate release tablet  Commonly known as:  ROXICODONE      TAKE these medications        albuterol 108 (90 BASE) MCG/ACT inhaler  Commonly known as:  PROVENTIL HFA;VENTOLIN HFA  Inhale 1-2 puffs into the lungs every 6 (six) hours as needed for wheezing or shortness of breath.     atenolol 25 MG tablet  Commonly known as:  TENORMIN  Take 25 mg by mouth daily.     atorvastatin 40 MG tablet  Commonly known as:  LIPITOR  Take 40 mg by mouth daily at 6 PM.      budesonide-formoterol 160-4.5 MCG/ACT inhaler  Commonly known as:  SYMBICORT  Inhale 2 puffs into the lungs 2 (two) times daily.     busPIRone 15 MG tablet  Commonly known as:  BUSPAR  Take 15 mg by mouth 2 (two) times daily.     citalopram 40 MG tablet  Commonly known as:  CELEXA  Take 40 mg by mouth daily.     dexamethasone 4 MG tablet  Commonly known as:  DECADRON  Take 5 tablets with food(=20 mg) 12 hrs and 6 hrs prior to Taxol chemotherapy     enoxaparin 40 MG/0.4ML injection  Commonly known as:  LOVENOX  Inject 0.4 mLs (40 mg total) into the skin daily.     HYDROmorphone 2 MG tablet  Commonly known as:  DILAUDID  Take 1-2 tablets (2-4 mg total) by mouth every 4 (four) hours as needed for severe pain.     lisinopril 20 MG tablet  Commonly known as:  PRINIVIL,ZESTRIL  Take 20 mg by mouth daily.     LORazepam 1 MG tablet  Commonly known as:  ATIVAN  Place 1 tablet under the tongue or swallow every 6 hrs as needed for nausea. Will make drowsy. Take night of chemo whether or not nauseous. Do not take close to trazadone.     metFORMIN 500 MG tablet  Commonly known as:  GLUCOPHAGE  Take 500 mg by mouth daily with breakfast.     multivitamin with minerals Tabs tablet  Take 1 tablet by mouth daily.     ondansetron 8 MG tablet  Commonly known as:  ZOFRAN  Take 1 tablet (8 mg total) by mouth every 8 (eight) hours as needed for nausea. Will not make drowsy.     promethazine 25 MG tablet  Commonly known as:  PHENERGAN  Take 1 tablet (25 mg total) by mouth every 6 (six) hours as needed for nausea or vomiting.     traMADol 50 MG tablet  Commonly known as:  ULTRAM  Take 1 tablet (50 mg total) by mouth every 6 (six) hours as needed.     traZODone 50 MG tablet  Commonly known as:  DESYREL  Take 50 mg by mouth at bedtime.         Signed: Donaciano Eva 09/08/2015, 9:36 AM

## 2015-09-08 NOTE — Care Management Important Message (Signed)
Important Message  Patient Details  Name: [name] MRN: [id number] Date of Birth: [birth year]   Medicare Important Message Given:  Yes    Shelda Altes 09/08/2015, 1:31 PMImportant Message  Patient Details  Name: [name] MRN: [id number] Date of Birth: 02-10-1966   Medicare Important Message Given:  Yes    Shelda Altes 09/08/2015, 1:31 PM

## 2015-09-11 ENCOUNTER — Encounter: Payer: Self-pay | Admitting: Gynecologic Oncology

## 2015-09-11 ENCOUNTER — Ambulatory Visit: Payer: BLUE CROSS/BLUE SHIELD | Attending: Gynecologic Oncology | Admitting: Gynecologic Oncology

## 2015-09-11 VITALS — BP 159/101 | HR 112 | Temp 98.2°F | Resp 22 | Ht 62.0 in | Wt 235.2 lb

## 2015-09-11 DIAGNOSIS — T8131XA Disruption of external operation (surgical) wound, not elsewhere classified, initial encounter: Secondary | ICD-10-CM | POA: Insufficient documentation

## 2015-09-11 DIAGNOSIS — Z4802 Encounter for removal of sutures: Secondary | ICD-10-CM

## 2015-09-11 NOTE — Progress Notes (Signed)
Follow Up Note: Gyn-Onc  Melissa Richards 49 y.o. female  CC:  Chief Complaint  Patient presents with  . Routine Post Op    Staple removal    HPI:  Melissa Richards is a 49 year old G2P2 initially seen at the request of Dr Carlena Bjornstad for a large left ovarian mass and elevated CA 125.  She reported a 2 month history of increasing left sided abdominal discomfort and constipation and presented to the [facility] ER on 07/17/15 with symptoms of pelvic swelling, left lower back and abdominal pain.  A CT of the abdomen and pelvis on 07/17/15 showed bibasilar nodularity, a subcarinal 1.4cm node suggestive of infrahilar adenopathy, multiple small retroperitoneal nodes (none pathologic by size criteria) including a 15mm aortocaval node and a 1.5cm gastrohepatic ligament node. There is a portacaval node measuring 2.5 x 3.7cm (image/series 21/2) and a 1.2cm right external iliac node.  There is soft tissue fullness extending superiorly off the right ovary (measuring 6x10cm) and a dominant left ovarian cystic mass arising from the pelvis measuring 18 x 22.5cm.  A CA 125 was drawn on 07/23/15 and was elevated at 2782. (CEA was normal at 0.4).  Medical history significant for morbid obesity, OSA, asthma and non-cardiac shortness of breath. She underwent coronary catheterization 2 years ago for this, which was negative for blockage, and she did not require stenting, or antiplatelet therapy. She was referred to pulmonology who diagnosed her as having asthma. She is a diabetic.  A Chest Ct on 07/30/15 Revealed: 1. Bilateral small pulmonary nodules are concerning for pulmonary metastasis. 2. Mild mediastinal and bilateral hilar adenopathy is also concerning for metastatic disease. 3. Mildly enlarged LEFT supraclavicular lymph node is concerning for metastatic disease. This lymph node may be amenable to ultrasound-guided biopsy 4. Enlargement of the LEFT lobe of thyroid gland likely represents benign goiter.  Due to  the findings of unresectable chest and upper abdominal disease, she was scheduled for neoadjuvant chemotherapy following histologic confirmation via biopsy.  Biopsy of the supraclavicular node on 08/05/15 revealed FOCAL CLUSTERS WITH GRANULOMAS, SEE COMMENT. LYMPHOID TISSUE PRESENT. NO MALIGNANT CELLS IDENTIFIED.  Paracentesis of the ascites and biopsy of the pelvic soft tissue mass on 08/18/15 revealed SEROUS TUMOR WITH FOCAL PROLIFERATION, with the ascites showing papillary fragments of serous tumor (at least LMP).  Due to these inconclusive findings for malignancy and a concern that this is a low malignant potential tumor which would not be responsive to chemotherapy, chemotherapy was cancelled and surgery was scheduled. If this disease is LMP, it is likely that the chest and upper abdominal adenopathy is non-malignant.  On 09/04/15, she underwent an exploratory laparotomy with TAH, bilateral salpingo-oophorectomy, omentectomy radical tumor debulking for ovarian cancer. 22 modifier for extreme intraperitoneal obesity making surgical exposure more complex and increasing duration of procedure by 1 hour by Dr. [name].  Final pathology revealed: 1. 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 - NON-INVASIVE IMPLANT ON FALLOPIAN TUBE SEROSA. 2. Adnexa - ovary +/- tube, neoplastic, right - SEROUS BORDERLINE TUMOR, 8 CM WITH OVARIAN SURFACE INVOLVEMENT. - NON-INVASIVE IMPLANT ON FALLOPIAN TUBE SEROSA. 3. Uterus and cervix - CERVIX: NABOTHIAN CYST. - ENDOMETRIUM: INACTIVE. NO EVIDENCE OF HYPERPLASIA OR CARCINOMA. - MYOMETRIUM: LEIOMYOMA. - UTERINE SEROSA AND ATTACHED SOFT TISSUE: NON-INVASIVE IMPLANTS OF SEROUS BORDERLINE TUMOR. 4. Omentum, resection for tumor - MICROSCOPIC NON-INVASIVE IMPLANT OF SEROUS BORDERLINE TUMOR. - FOCI OF INFLAMMATION AND HYPEREMIA. 5. Soft tissue mass, simple excision,  sigmoid - SEROUS BORDERLINE TUMOR, 8 CM  CONSISTENT WITH NON-INVASIVE IMPLANT.   Interval History:  She presents today with her daughter for post-operative follow up and staple removal.  She states she has been doing well at home.  Tolerating diet with no emesis or nausea.  Minimal pain reported.  Ambulating without difficulty.  Bowels and bladder functioning without difficulty.  States she checks her blood sugars intermittently but she thinks they have been good. No other concerns voiced.  Review of Systems  Constitutional: Feels well.  Denies fever, chills, early satiety, unintentional weight loss or gain.  Cardiovascular: No chest pain, shortness of breath, or edema.  Pulmonary: No cough or wheeze.  Gastrointestinal: No nausea, vomiting, or diarrhea. No bright red blood per rectum or change in bowel movement.  Genitourinary: No frequency, urgency, or dysuria. No vaginal bleeding or discharge.  Musculoskeletal: No myalgia or joint pain. Neurologic: No weakness, numbness, or change in gait.  Psychology: No depression, anxiety, or insomnia.  Current Meds:  Outpatient Encounter Prescriptions as of 09/11/2015  Medication Sig  . albuterol (PROVENTIL HFA;VENTOLIN HFA) 108 (90 BASE) MCG/ACT inhaler Inhale 1-2 puffs into the lungs every 6 (six) hours as needed for wheezing or shortness of breath.  Marland Kitchen atenolol (TENORMIN) 25 MG tablet Take 25 mg by mouth daily.  Marland Kitchen atorvastatin (LIPITOR) 40 MG tablet Take 40 mg by mouth daily at 6 PM.   . budesonide-formoterol (SYMBICORT) 160-4.5 MCG/ACT inhaler Inhale 2 puffs into the lungs 2 (two) times daily.  . busPIRone (BUSPAR) 15 MG tablet Take 15 mg by mouth 2 (two) times daily.  . citalopram (CELEXA) 40 MG tablet Take 40 mg by mouth daily.  Marland Kitchen dexamethasone (DECADRON) 4 MG tablet Take 5 tablets with food(=20 mg) 12 hrs and 6 hrs prior to Taxol chemotherapy  . enoxaparin (LOVENOX) 40 MG/0.4ML injection Inject 0.4 mLs (40 mg total) into the skin daily.  Marland Kitchen HYDROmorphone (DILAUDID) 2 MG tablet Take 1-2  tablets (2-4 mg total) by mouth every 4 (four) hours as needed for severe pain.  Marland Kitchen lisinopril (PRINIVIL,ZESTRIL) 20 MG tablet Take 20 mg by mouth daily.  Marland Kitchen LORazepam (ATIVAN) 1 MG tablet Place 1 tablet under the tongue or swallow every 6 hrs as needed for nausea. Will make drowsy. Take night of chemo whether or not nauseous. Do not take close to trazadone.  . metFORMIN (GLUCOPHAGE) 500 MG tablet Take 500 mg by mouth daily with breakfast.   . Multiple Vitamin (MULTIVITAMIN WITH MINERALS) TABS tablet Take 1 tablet by mouth daily.  . ondansetron (ZOFRAN) 8 MG tablet Take 1 tablet (8 mg total) by mouth every 8 (eight) hours as needed for nausea. Will not make drowsy.  . promethazine (PHENERGAN) 25 MG tablet Take 1 tablet (25 mg total) by mouth every 6 (six) hours as needed for nausea or vomiting.  . traMADol (ULTRAM) 50 MG tablet Take 1 tablet (50 mg total) by mouth every 6 (six) hours as needed. (Patient taking differently: Take 50 mg by mouth every 6 (six) hours as needed (pain). )  . traZODone (DESYREL) 50 MG tablet Take 50 mg by mouth at bedtime.   No facility-administered encounter medications on file as of 09/11/2015.    Allergy:  Allergies  Allergen Reactions  . Hydrocodone Itching    Can only take w benadryl    Social Hx:   Social History   Social History  . Marital Status: Married    Spouse Name: N/A  . Number of Children: N/A  .  Years of Education: N/A   Occupational History  . Not on file.   Social History Main Topics  . Smoking status: Never Smoker   . Smokeless tobacco: Never Used  . Alcohol Use: No  . Drug Use: No  . Sexual Activity: Not on file   Other Topics Concern  . Not on file   Social History Narrative    Past Surgical Hx:  Past Surgical History  Procedure Laterality Date  . Tubal ligation    . Tonsillectomy    . Cardiac catheterization      2 years ago  . Abdominal hysterectomy Bilateral 09/04/2015    Procedure: HYSTERECTOMY TOTAL ABDOMINAL,  EXPLORATORY LAPAROTOMY, BILATERAL SALPINGO OOPHERECTOMY, OMENTECTOMY, DEBULKING;  Surgeon: [name], MD;  Location: [facility];  Service: Gynecology;  Laterality: Bilateral;    Past Medical Hx:  Past Medical History  Diagnosis Date  . Arthritis   . Asthma   . Depression   . Diabetes mellitus without complication ([place])   . Hypertension   . Family history of adverse reaction to anesthesia     FAMILY MEMBERS ARE SLOW TO WAKE UP  . Numbness and tingling     FEET /LEGS  . Constipation   . Recurrent UTI   . Sleep apnea     DOES NOT USE C -PAP  . Anxiety   . Adnexal mass     BILATERAL    Family Hx:  Family History  Problem Relation Age of Onset  . Anesthesia problems Mother   . Diabetes Mother   . Hypertension Mother   . Cancer Father   . Diabetes Brother     Vitals:  Blood pressure 159/101, pulse 112, temperature 98.2 F (36.8 C), temperature source Oral, resp. rate 22, height 5\' 2"  (1.575 m), weight 235 lb 3.2 oz (106.686 kg), SpO2 100 %.  Physical Exam:  General: Well developed, well nourished female in no acute distress. Alert and oriented x 3.  Cardiovascular: Regular rate and rhythm. S1 and S2 normal.  Lungs: Clear to auscultation bilaterally. No wheezes/crackles/rhonchi noted.  Skin: No rashes or lesions present. Back: No CVA tenderness.  Abdomen: Abdomen soft, non-tender and morbidly obese. Active bowel sounds in all quadrants.  Honeycomb dressing removed from incision.  37 staples removed from the midline incision without difficulty.  1 cm opening above the umbilicus developed after removing half of the staples and moderate amount of serous drainage drained.  After removal of all staples, lower aspect of the incision started to open 1-2 cm and when attempting to pack with moistened gauze, the incision opened to around 5 cm in length.  Incision was assessed by [name].  Daughter was being instructed on packing the incision with the patient laying comfortably on the table  when the entire midline incision opened spontaneously.  Wound opened to the full length of the incision from below the breasts to above the mons.  Wound with a depth of 3-4 cm with width of 5-6 cm.       Extremities: No bilateral cyanosis, edema, or clubbing.   Assessment/Plan:  49 year old female s/p exploratory laparotomy with TAH, bilateral salpingo-oophorectomy, omentectomy radical tumor debulking for ovarian cancer. 22 modifier for extreme intraperitoneal obesity making surgical exposure more complex and increasing duration of procedure by 1 hour by [name] on 12/1/1 for serous borderline tumor of the ovary.  Wound dehisced without evidence of infection.  Due to her extreme obesity, diabetes, and depth of wound, wound VAC is necessary  per [name].  Secondary intent closure with wet to dry dressing changes is not feasible in her case per [name].  Referral to home health will be placed for a wound vac.  Dressing supplies for wet to dry given to the patient's daughter to use prn and before vac is placed.  Reportable signs and symptoms reviewed.  She is to follow up in one week or sooner if needed.  Pathology discussed with the patient by [name] who recommended no adjuvant therapy.  Over one hour spent with the patient and her daughter assessing the wound, discussing pathology, and teaching wet to dry dressing changes.  Mallary Kreger DEAL, NP 09/11/2015, 2:44 PM

## 2015-09-11 NOTE — Patient Instructions (Signed)
We will contact home health to obtain a wound vac.  Plan to follow up as scheduled or sooner if needed.

## 2015-09-12 ENCOUNTER — Telehealth: Payer: Self-pay | Admitting: *Deleted

## 2015-09-12 NOTE — Telephone Encounter (Addendum)
Orders for wound vac and HH RN faxed to [place]. Spoke with Asencion Partridge at [facility] wound vac division and she confirmed they received wound vac equipment order.   Confirmed with LouAnn at [facility] that the wound vac will be delivered to patient's home today between 3-7pm and that a nurse will go see patient tomorrow morning to apply wound vac. Per LouAnn, there is not a nurse available today to go out and see patient. Discussed with [name], NP. Called and spoke with patient - she states that she has already received a phone call from [facility] confirming wound vac delivery later today. She states that her daughter came to her house earlier today and was able to help her change the wet to dry dressing. Instructed patient to change the dressing again this evening. Patient instructed that the nurse from [facility] will call her later today to confirm what time she will arrive in the morning - patient verbalizes understanding and denies any other concerns at this time.

## 2015-09-15 ENCOUNTER — Telehealth: Payer: Self-pay | Admitting: *Deleted

## 2015-09-15 ENCOUNTER — Other Ambulatory Visit: Payer: Self-pay | Admitting: *Deleted

## 2015-09-15 ENCOUNTER — Ambulatory Visit: Payer: BLUE CROSS/BLUE SHIELD

## 2015-09-15 DIAGNOSIS — N838 Other noninflammatory disorders of ovary, fallopian tube and broad ligament: Secondary | ICD-10-CM

## 2015-09-15 MED ORDER — HYDROMORPHONE HCL 2 MG PO TABS: 2.0000 mg | ORAL_TABLET | Freq: Four times a day (QID) | ORAL | Status: DC | PRN

## 2015-09-15 NOTE — Telephone Encounter (Signed)
Pt called states she stands up and urinates on self, has leakage at times and requesting Hydromorphone 2mg  q4hrs refilled. Pt also states [facility] is to call her today regarding MWF wound care. Pt confirmed this is urine not fluid from vagina. Discussed with pt to empty bladder q2hrs, limit caffeine products and fluids prior to bedtime. Reviewed with NP, returned call to pt and advised continue bladder training keep appt for this Friday. If bladder does not have leakage or incontinence she may call and cancel appt. Otherwise pt will need to come in to be evaluated.

## 2015-09-15 NOTE — Telephone Encounter (Signed)
Reviewed with NP, ok to refill Hydromorphone 2mg  1 tablet q 6hrs, no refills Pt notified Rx ready for pick up.

## 2015-09-17 ENCOUNTER — Telehealth: Payer: Self-pay

## 2015-09-17 NOTE — Telephone Encounter (Signed)
Patient called this afternoon with concerns of an oral temp of 100.4 , patient denies chills , cough  , surgical site pain , dysuria/no urinary leakage or N/V . Patient states some mild "constipation" which she will initiated a laxative today Melissa Cross , APNP updated with patient's concerns , orders received to have the patient assess an oral temp at 2 PM and 4 PM and call back with the values. Patient called back and stated that she was covered with a heavy blanket and was feeling hot abdominal tenderness about 3 PM . Patient states her last temp was 99.9 oral and the temp of 101.5 was when she was covered with the blanket . Dr Denman George was updated , orders received to have the call [phone] the GYN/ONC on call physician with a temp of 101.5 . Patient states understanding , denies further questions at this time ,is aware to call the on call MD with a temp/pain or additional changes or concerns.

## 2015-09-19 ENCOUNTER — Ambulatory Visit: Payer: Medicare Other | Admitting: Gynecologic Oncology

## 2015-09-19 NOTE — Telephone Encounter (Signed)
Orders received from Dr [name] to contact the patient re: patient contacted the On-Call Physician last night with a temp of 103.8 . During my previous conversation the patient failed to mention this . I attempted to contact the patient to update her with Dr Terrence Dupont Rossi's recommendation to come in as scheduled . No answer , left a detailed message with call back requested , contact information provided on VM.

## 2015-09-19 NOTE — Telephone Encounter (Signed)
Follow up call placed to see how the patient was doing and to reschedule her appointment as the patient cancelled her appointment with Dr [name] today . The patient states she is feeling "better" today and felt that she does not have to be seen . Patient states the highest oral temp she has had was 100.4 and is aware to call the On-Call physician after hours or during the weekend.. Post-Op follow up appointment scheduled for October 10, 2015 at 3:30 PM . Patient instructed to call if she has an elevated temp as previously discussed on Sep 17, 2015 . Patient states understanding ,denies further questions at this time.

## 2015-09-19 NOTE — Telephone Encounter (Signed)
Patient returned call per request , patient states she had the on Wednesday Sep 17, 2015 , Dr [name] updated . Orders received to instruct the patient to continue to monitor and call with elevated oral temp or any changes . Patient states understanding , will call with elevated temp or any changes.

## 2015-10-09 ENCOUNTER — Ambulatory Visit: Payer: Medicare Other | Admitting: Gynecologic Oncology

## 2015-10-10 ENCOUNTER — Ambulatory Visit: Payer: Medicare Other | Admitting: Gynecologic Oncology

## 2015-10-21 ENCOUNTER — Other Ambulatory Visit: Payer: Self-pay

## 2015-10-21 MED ORDER — TRAMADOL HCL 50 MG PO TABS: 50.0000 mg | ORAL_TABLET | Freq: Four times a day (QID) | ORAL | Status: DC | PRN

## 2015-10-30 ENCOUNTER — Ambulatory Visit: Payer: BLUE CROSS/BLUE SHIELD | Attending: Gynecologic Oncology | Admitting: Gynecologic Oncology

## 2015-10-30 ENCOUNTER — Encounter: Payer: Self-pay | Admitting: Gynecologic Oncology

## 2015-10-30 VITALS — BP 137/88 | HR 83 | Temp 98.6°F | Resp 18 | Ht 62.0 in | Wt 220.4 lb

## 2015-10-30 DIAGNOSIS — N839 Noninflammatory disorder of ovary, fallopian tube and broad ligament, unspecified: Secondary | ICD-10-CM | POA: Diagnosis not present

## 2015-10-30 DIAGNOSIS — N838 Other noninflammatory disorders of ovary, fallopian tube and broad ligament: Secondary | ICD-10-CM

## 2015-10-30 DIAGNOSIS — R599 Enlarged lymph nodes, unspecified: Secondary | ICD-10-CM

## 2015-10-30 DIAGNOSIS — R591 Generalized enlarged lymph nodes: Secondary | ICD-10-CM | POA: Diagnosis not present

## 2015-10-30 NOTE — Patient Instructions (Signed)
Plan for a CT scan of the chest and MRI of the abdomen.  Plan to follow up with [name] in three months or sooner if needed.

## 2015-10-30 NOTE — Progress Notes (Signed)
Follow Up Note: Gyn-Onc  Melissa Richards 50 y.o. female  CC:  Chief Complaint  Patient presents with  . 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   1/ Wound dehiscence postop: wound healing well with wound vac. Plan is to continue current management.  2/ Stage IIIC serous LMP of ovary: recommend no further adjuvant therapy for this cell type. High risk for recurrence given peritoneal disease at time of diagnosis. Will follow q 3 monthly with physical exam and pelvic exam and CA 125 assessments.  3/ Preoperative hilar, supraclavicular and portahepatis lymphadenopathy and bilateral pulmonary nodules: Preop biopsy of supraclavicular node was negative for malignancy on 08/05/15. Recommend repeated chest CT and MR of abdomen to evaluate adenopathy for progression vs stability. If stable, likely a benign process. If enlarging, needs further workup.  HPI:  Melissa Richards is a 50 year old G2P2 initially seen at the request of Dr Carlena Bjornstad for a large left ovarian mass and elevated CA 125 on November 1st, 2016.  She reported a 2 month history of increasing left sided abdominal discomfort and constipation and presented to the [facility] ER on 07/17/15 with symptoms of pelvic swelling, left lower back and abdominal pain.  A CT of the abdomen and pelvis on 07/17/15 showed bibasilar nodularity, a subcarinal 1.4cm node suggestive of infrahilar adenopathy, multiple small retroperitoneal nodes (none pathologic by size criteria) including a 3mm aortocaval node and a 1.5cm gastrohepatic ligament node. There is a portacaval node measuring 2.5 x 3.7cm (image/series 21/2) and a 1.2cm right external iliac node.  There is soft tissue fullness extending  superiorly off the right ovary (measuring 6x10cm) and a dominant left ovarian cystic mass arising from the pelvis measuring 18 x 22.5cm.   A CA 125 was drawn on 07/23/15 and was elevated at 2782. (CEA was normal at 0.4).  Medical history significant for morbid obesity, OSA, asthma and non-cardiac shortness of breath. She underwent coronary catheterization 2 years ago for this, which was negative for blockage, and she did not require stenting, or antiplatelet therapy. She was referred to pulmonology who diagnosed her as having asthma. She is a diabetic.  A Chest Ct on 07/30/15 Revealed: 1. Bilateral small pulmonary nodules are concerning for pulmonary metastasis. 2. Mild mediastinal and bilateral hilar adenopathy is also concerning for metastatic disease. 3. Mildly enlarged LEFT supraclavicular lymph node is concerning for metastatic disease. This lymph node may be amenable to ultrasound-guided biopsy 4. Enlargement of the LEFT lobe of thyroid gland likely represents benign goiter.    Due to the findings of unresectable chest and upper abdominal disease, she was scheduled for neoadjuvant chemotherapy following histologic confirmation via biopsy.  Biopsy of the supraclavicular node on 08/05/15 revealed FOCAL CLUSTERS WITH GRANULOMAS, SEE COMMENT. LYMPHOID TISSUE PRESENT. NO MALIGNANT CELLS IDENTIFIED.  Paracentesis of the ascites and biopsy of the pelvic soft tissue mass on 08/18/15 revealed SEROUS TUMOR WITH FOCAL PROLIFERATION, with the ascites showing papillary fragments of serous tumor (at least LMP).  Due to these inconclusive findings for malignancy and a concern that this is a low malignant potential tumor which would not be responsive to chemotherapy, chemotherapy was cancelled and surgery was scheduled. If this disease is LMP, it is likely that the chest and upper abdominal  adenopathy is non-malignant.  On 09/04/15, she underwent an exploratory laparotomy with TAH, bilateral salpingo-oophorectomy,  omentectomy radical tumor debulking for ovarian cancer. 22 modifier for extreme intraperitoneal obesity making surgical exposure more complex and increasing duration of procedure by 1 hour by Dr. [name].  Final pathology revealed: 1. 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 - NON-INVASIVE IMPLANT ON FALLOPIAN TUBE SEROSA. 2. Adnexa - ovary +/- tube, neoplastic, right - SEROUS BORDERLINE TUMOR, 8 CM WITH OVARIAN SURFACE INVOLVEMENT. - NON-INVASIVE IMPLANT ON FALLOPIAN TUBE SEROSA. 3. Uterus and cervix - CERVIX: NABOTHIAN CYST. - ENDOMETRIUM: INACTIVE. NO EVIDENCE OF HYPERPLASIA OR CARCINOMA. - MYOMETRIUM: LEIOMYOMA. - UTERINE SEROSA AND ATTACHED SOFT TISSUE: NON-INVASIVE IMPLANTS OF SEROUS BORDERLINE TUMOR. 4. Omentum, resection for tumor - MICROSCOPIC NON-INVASIVE IMPLANT OF SEROUS BORDERLINE TUMOR. - FOCI OF INFLAMMATION AND HYPEREMIA. 5. Soft tissue mass, simple excision, sigmoid - SEROUS BORDERLINE TUMOR, 8 CM CONSISTENT WITH NON-INVASIVE IMPLANT.   Interval History:    At her postoperative evaluation she had staples removed and dehiscence of her wound. A wound vac was applied and managed as an outpatient. There was no cellulitis or infection.  She has been receiving home health care for her wound vac and doing well. She has no complaints.  Review of Systems  Constitutional: Feels well.  Denies fever, chills, early satiety, unintentional weight loss or gain.  Cardiovascular: No chest pain, shortness of breath, or edema.  Pulmonary: No cough or wheeze.  Gastrointestinal: No nausea, vomiting, or diarrhea. No bright red blood per rectum or change in bowel movement.  Genitourinary: No frequency, urgency, or dysuria. No vaginal bleeding or discharge.  Musculoskeletal: No myalgia or joint pain. Neurologic: No weakness, numbness, or change in gait.  Psychology: No depression, anxiety, or insomnia.  Current Meds:   Outpatient Encounter Prescriptions as of 10/30/2015  Medication Sig  . atenolol (TENORMIN) 25 MG tablet Take 25 mg by mouth daily.  Marland Kitchen atorvastatin (LIPITOR) 40 MG tablet Take 40 mg by mouth daily at 6 PM.   . budesonide-formoterol (SYMBICORT) 160-4.5 MCG/ACT inhaler Inhale 2 puffs into the lungs 2 (two) times daily.  . busPIRone (BUSPAR) 15 MG tablet Take 15 mg by mouth 2 (two) times daily.  . citalopram (CELEXA) 40 MG tablet Take 40 mg by mouth daily.  Marland Kitchen lisinopril (PRINIVIL,ZESTRIL) 20 MG tablet Take 20 mg by mouth daily.  . metFORMIN (GLUCOPHAGE) 500 MG tablet Take 500 mg by mouth daily with breakfast.   . Multiple Vitamin (MULTIVITAMIN WITH MINERALS) TABS tablet Take 1 tablet by mouth daily.  . traMADol (ULTRAM) 50 MG tablet Take 1 tablet (50 mg total) by mouth every 6 (six) hours as needed for moderate pain.  . traZODone (DESYREL) 50 MG tablet Take 50 mg by mouth at bedtime.  Marland Kitchen albuterol (PROVENTIL HFA;VENTOLIN HFA) 108 (90 BASE) MCG/ACT inhaler Inhale 1-2 puffs into the lungs every 6 (six) hours as needed for wheezing or shortness of breath. Reported on 10/30/2015  . dexamethasone (DECADRON) 4 MG tablet Take 5 tablets with food(=20 mg) 12 hrs and 6 hrs prior to Taxol chemotherapy (Patient not taking: Reported on 10/30/2015)  . ondansetron (ZOFRAN) 8 MG tablet Take 1 tablet (8 mg total) by mouth every 8 (eight) hours as needed for nausea. Will not make drowsy. (Patient not taking: Reported on 10/30/2015)  . promethazine (PHENERGAN) 25 MG tablet Take 1 tablet (25 mg total) by mouth every 6 (six) hours as needed for nausea or vomiting. (  Patient not taking: Reported on 10/30/2015)  . [DISCONTINUED] enoxaparin (LOVENOX) 40 MG/0.4ML injection Inject 0.4 mLs (40 mg total) into the skin daily.  . [DISCONTINUED] HYDROmorphone (DILAUDID) 2 MG tablet Take 1 tablet (2 mg total) by mouth every 6 (six) hours as needed for severe pain.  . [DISCONTINUED] LORazepam (ATIVAN) 1 MG tablet Place 1 tablet under the  tongue or swallow every 6 hrs as needed for nausea. Will make drowsy. Take night of chemo whether or not nauseous. Do not take close to trazadone.   No facility-administered encounter medications on file as of 10/30/2015.    Allergy:  Allergies  Allergen Reactions  . Hydrocodone Itching    Can only take w benadryl    Social Hx:   Social History   Social History  . Marital Status: Married    Spouse Name: N/A  . Number of Children: N/A  . Years of Education: N/A   Occupational History  . Not on file.   Social History Main Topics  . Smoking status: Never Smoker   . Smokeless tobacco: Never Used  . Alcohol Use: No  . Drug Use: No  . Sexual Activity: Not on file   Other Topics Concern  . Not on file   Social History Narrative    Past Surgical Hx:  Past Surgical History  Procedure Laterality Date  . Tubal ligation    . Tonsillectomy    . Cardiac catheterization      2 years ago  . Abdominal hysterectomy Bilateral 09/04/2015    Procedure: HYSTERECTOMY TOTAL ABDOMINAL, EXPLORATORY LAPAROTOMY, BILATERAL SALPINGO OOPHERECTOMY, OMENTECTOMY, DEBULKING;  Surgeon: [name], MD;  Location: [facility];  Service: Gynecology;  Laterality: Bilateral;    Past Medical Hx:  Past Medical History  Diagnosis Date  . Arthritis   . Asthma   . Depression   . Diabetes mellitus without complication ([place])   . Hypertension   . Family history of adverse reaction to anesthesia     FAMILY MEMBERS ARE SLOW TO WAKE UP  . Numbness and tingling     FEET /LEGS  . Constipation   . Recurrent UTI   . Sleep apnea     DOES NOT USE C -PAP  . Anxiety   . Adnexal mass     BILATERAL    Family Hx:  Family History  Problem Relation Age of Onset  . Anesthesia problems Mother   . Diabetes Mother   . Hypertension Mother   . Cancer Father   . Diabetes Brother     Vitals:  Blood pressure 137/88, pulse 83, temperature 98.6 F (37 C), temperature source Oral, resp. rate 18, height 5\' 2"  (1.575 m),  weight 220 lb 6.4 oz (99.973 kg), SpO2 98 %.  Physical Exam:  General: Well developed, well nourished female in no acute distress. Alert and oriented x 3.  Cardiovascular: Regular rate and rhythm. S1 and S2 normal.  Lungs: Clear to auscultation bilaterally. No wheezes/crackles/rhonchi noted.  Skin: No rashes or lesions present. Back: No CVA tenderness.  Genitourinary: vaginal cuff well healed, no bleeding or discharge. Abdomen: Abdomen soft, non-tender and morbidly obese. Wound vac in situ (patient declined removal). Surrounding skin edges with no erythema.       Extremities: No bilateral cyanosis, edema, or clubbing.    Donaciano Eva, MD  10/30/2015, 4:58 PM

## 2015-11-06 ENCOUNTER — Other Ambulatory Visit (HOSPITAL_BASED_OUTPATIENT_CLINIC_OR_DEPARTMENT_OTHER): Payer: BLUE CROSS/BLUE SHIELD

## 2015-11-06 DIAGNOSIS — C801 Malignant (primary) neoplasm, unspecified: Secondary | ICD-10-CM

## 2015-11-06 DIAGNOSIS — C562 Malignant neoplasm of left ovary: Secondary | ICD-10-CM

## 2015-11-06 LAB — COMPREHENSIVE METABOLIC PANEL
ALBUMIN: 3.7 g/dL (ref 3.5–5.0)
ALT: 16 U/L (ref 0–55)
ANION GAP: 12 meq/L — AB (ref 3–11)
AST: 16 U/L (ref 5–34)
Alkaline Phosphatase: 78 U/L (ref 40–150)
BILIRUBIN TOTAL: 0.46 mg/dL (ref 0.20–1.20)
BUN: 8.8 mg/dL (ref 7.0–26.0)
CALCIUM: 10.2 mg/dL (ref 8.4–10.4)
CO2: 29 mEq/L (ref 22–29)
CREATININE: 0.9 mg/dL (ref 0.6–1.1)
Chloride: 101 mEq/L (ref 98–109)
EGFR: 79 mL/min/{1.73_m2} — ABNORMAL LOW (ref >90)
Glucose: 114 mg/dl (ref 70–140)
Potassium: 4.5 mEq/L (ref 3.5–5.1)
Sodium: 141 mEq/L (ref 136–145)
TOTAL PROTEIN: 7 g/dL (ref 6.4–8.3)

## 2015-11-06 LAB — CBC WITH DIFFERENTIAL/PLATELET
BASO%: 0.6 % (ref 0.0–2.0)
Basophils Absolute: 0.1 10*3/uL (ref 0.0–0.1)
EOS%: 4.1 % (ref 0.0–7.0)
Eosinophils Absolute: 0.3 10*3/uL (ref 0.0–0.5)
HEMATOCRIT: 40.7 % (ref 34.8–46.6)
HEMOGLOBIN: 12.7 g/dL (ref 11.6–15.9)
LYMPH%: 9.5 % — ABNORMAL LOW (ref 14.0–49.7)
MCH: 25.8 pg (ref 25.1–34.0)
MCHC: 31.2 g/dL — ABNORMAL LOW (ref 31.5–36.0)
MCV: 82.6 fL (ref 79.5–101.0)
MONO#: 0.7 10*3/uL (ref 0.1–0.9)
MONO%: 9.3 % (ref 0.0–14.0)
NEUT%: 76.5 % (ref 38.4–76.8)
NEUTROS ABS: 6.1 10*3/uL (ref 1.5–6.5)
PLATELETS: 255 10*3/uL (ref 145–400)
RBC: 4.93 10*6/uL (ref 3.70–5.45)
RDW: 15.8 % — AB (ref 11.2–14.5)
WBC: 8 10*3/uL (ref 3.9–10.3)
lymph#: 0.8 10*3/uL — ABNORMAL LOW (ref 0.9–3.3)

## 2015-11-08 IMAGING — US US BIOPSY
1 series · 12 of 12 positions shown · non-contrast
Comparison: none

CLINICAL DATA: Cystic left ovarian mass, abdominal ascites and
omental edema, bilateral pulmonary nodules, thoracic adenopathy,
left supraclavicular adenopathy.

[Series 1: us biopsy · 0.07mm/px · 12 of 12 slices shown]
[im 1/12]
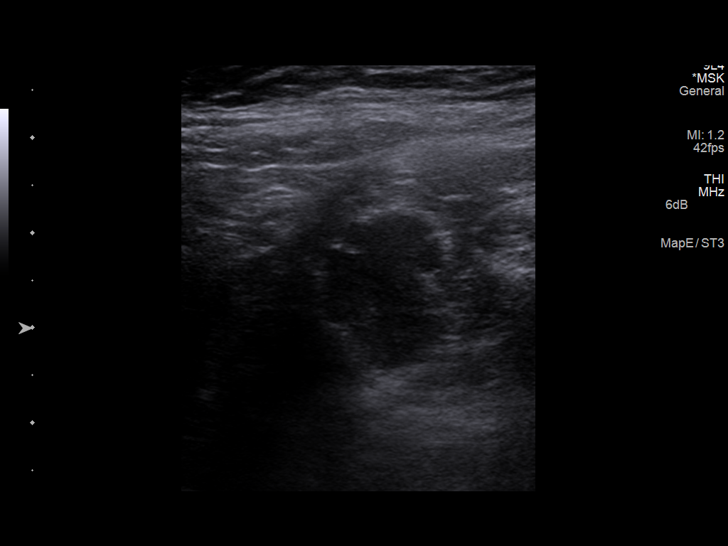
[im 2/12]
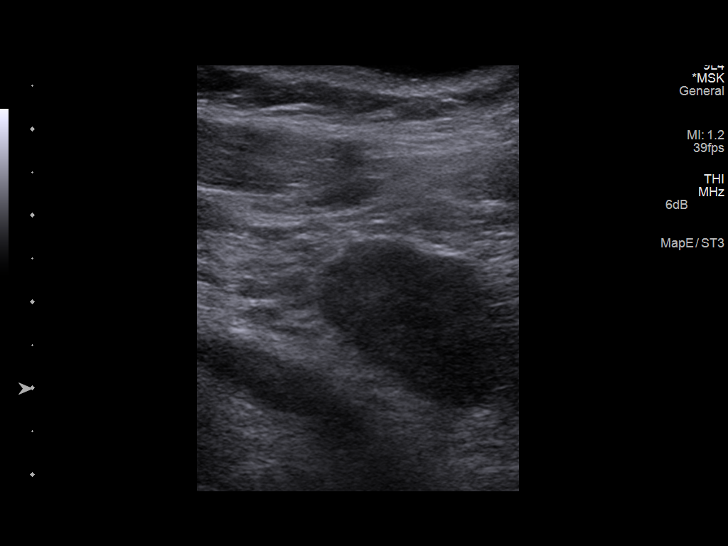
[im 3/12]
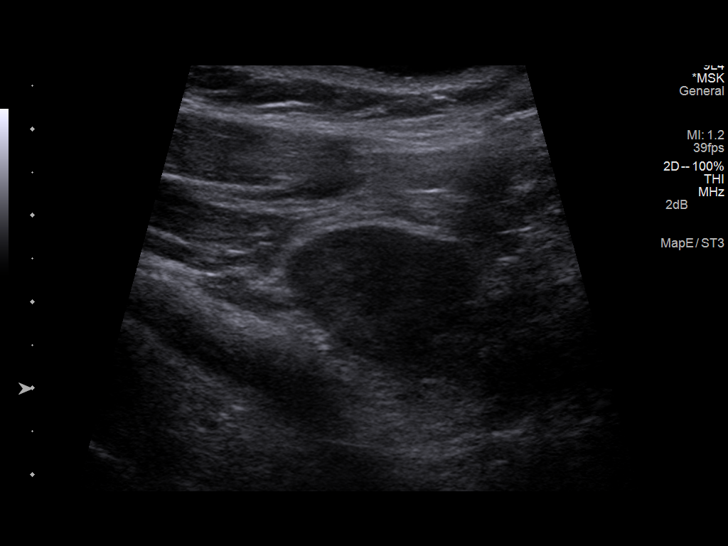
[im 4/12]
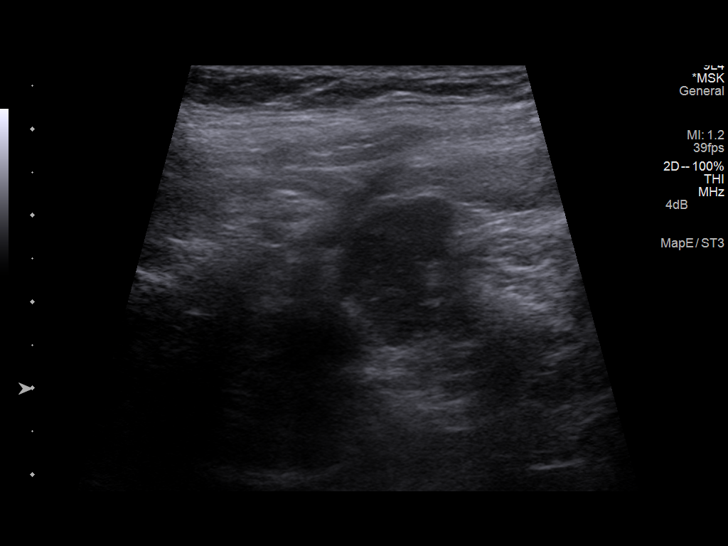
[im 5/12]
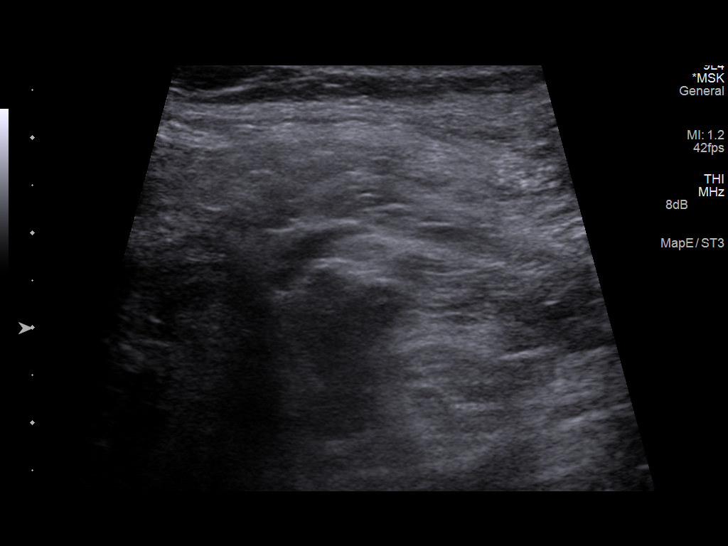
[im 6/12]
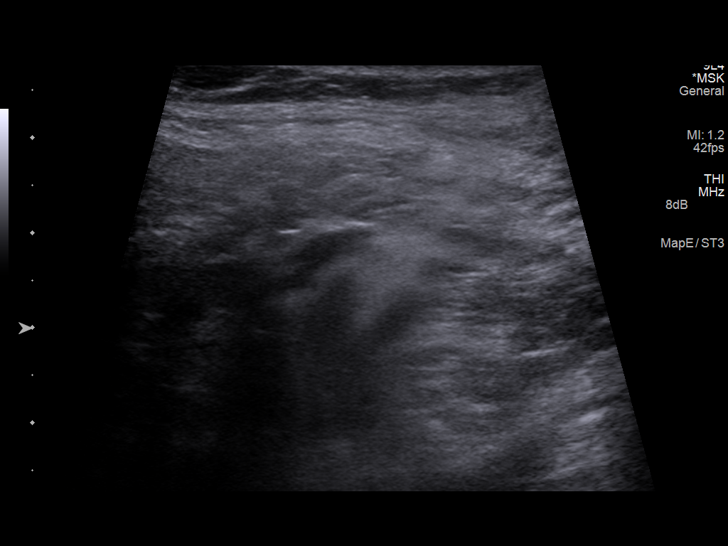
[im 7/12]
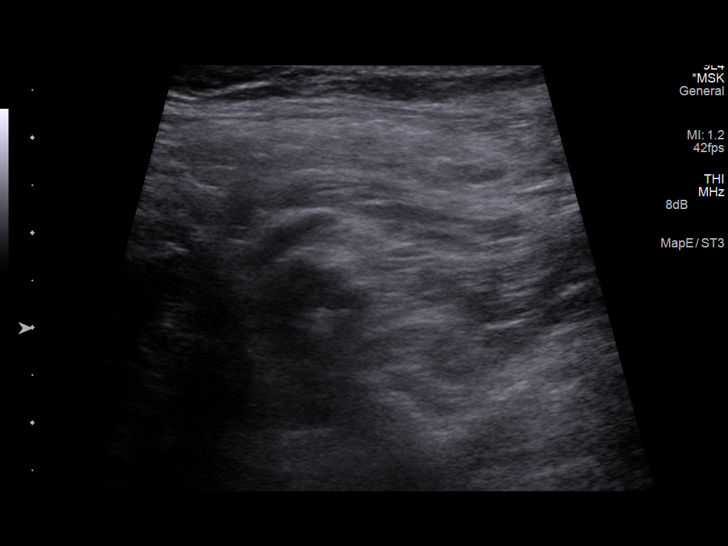
[im 8/12]
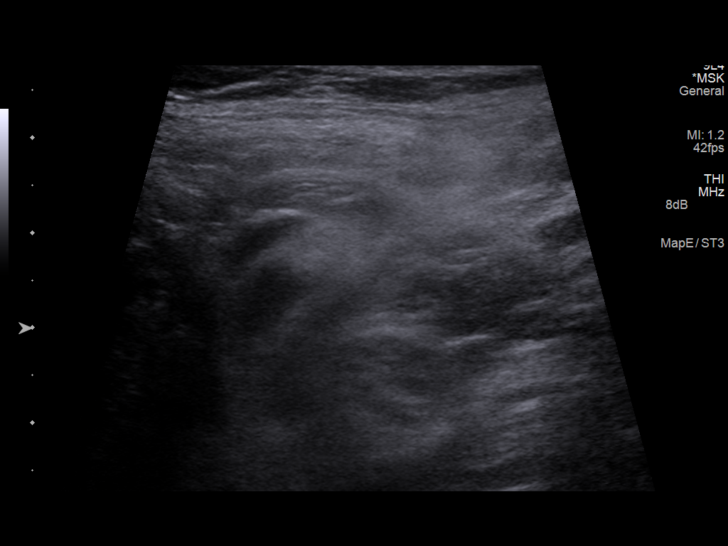
[im 9/12]
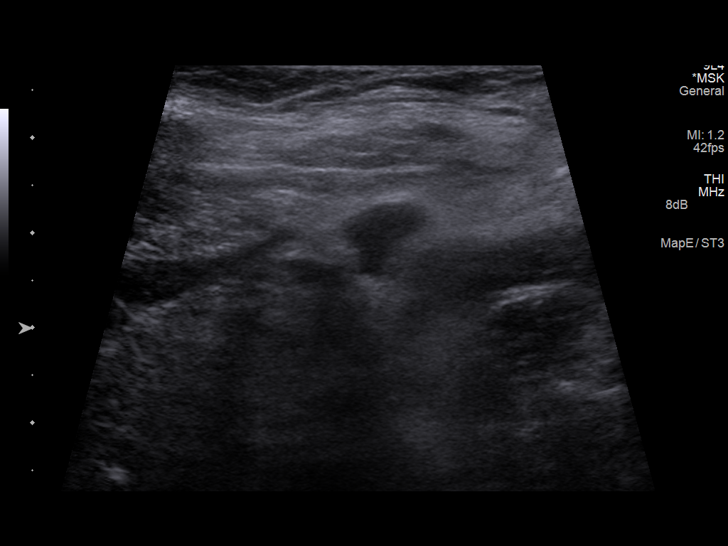
[im 10/12]
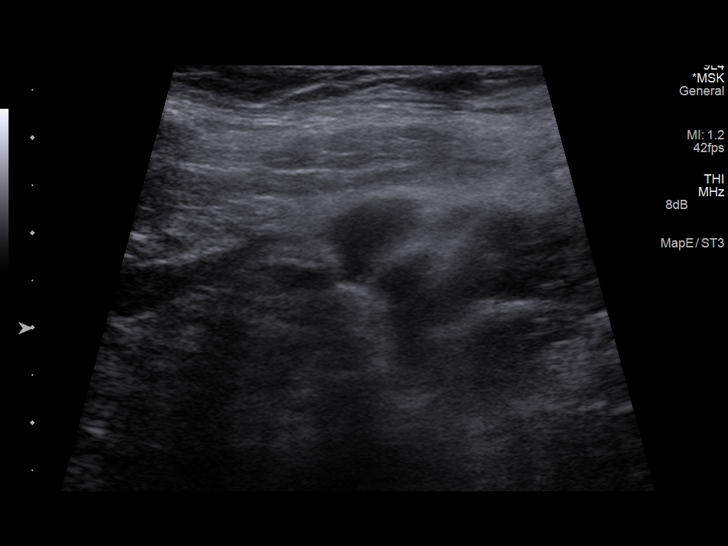
[im 11/12]
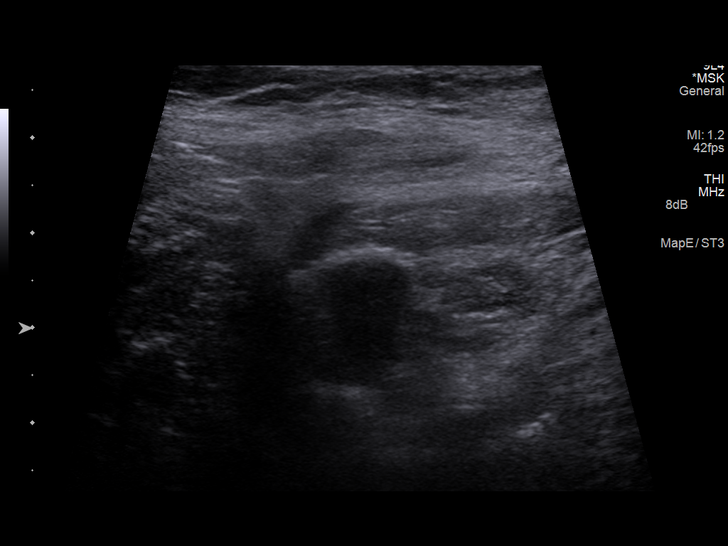
[im 12/12]
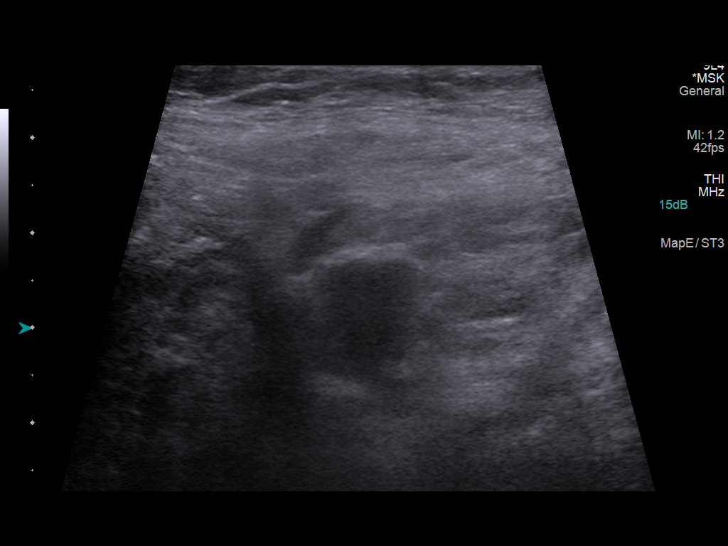

[12 of 12 positions shown; findings below may reference images not displayed]

EXAM:
ULTRASOUND GUIDED FNA BIOPSY OF LEFT SUPRACLAVICULAR ADENOPATHY

MEDICATIONS:
Intravenous Fentanyl and Versed were administered as conscious
sedation during continuous cardiorespiratory monitoring by the
radiology RN, with a total moderate sedation time of 35 minutes.

PROCEDURE:
The procedure, risks, benefits, and alternatives were explained to
the patient. Questions regarding the procedure were encouraged and
answered. The patient understands and consents to the procedure.

Survey ultrasound of the left neck performed on the supraclavicular
adenopathy localized. An appropriate skin entry site was determined
and marked.

The operative field was prepped with chlorhexidine in a sterile
fashion, and a sterile drape was applied covering the operative
field. A sterile gown and sterile gloves were used for the
procedure. Local anesthesia was provided with 1% Lidocaine.

Under real-time ultrasound guidance, FNA biopsy x3 using 21 gauge
Inrad needles performed. Core biopsy not performed due to proximity
to major vascular structures and pleural surface. Quick stain
analysis was adequate. Postprocedure scans show no hematoma or other
apparent complication. The patient tolerated the procedure well.

COMPLICATIONS:
None immediate
FINDINGS: Enlarged left supraclavicular node measuring approximately 24 x 17
mm was localized. FNA biopsy under ultrasound guidance as above.
IMPRESSION: 1. Technically successful ultrasound-guided FNA biopsy of left
supraclavicular adenopathy.

## 2015-11-13 ENCOUNTER — Telehealth: Payer: Self-pay

## 2015-11-13 NOTE — Telephone Encounter (Signed)
Patient's call returned to update on CBC with Differential and Comprehensive Metabolic Panel obtained on 11/06/2015,  reviewed by [name] , APNP  ( WNL ) . Patient updated , all questions answered , patient states understanding , denies further questions at this time.

## 2015-11-19 ENCOUNTER — Ambulatory Visit (HOSPITAL_COMMUNITY)
Admission: RE | Admit: 2015-11-19 | Discharge: 2015-11-19 | Disposition: A | Discharge status: Home / Self Care | Payer: BLUE CROSS/BLUE SHIELD | Source: Ambulatory Visit | Attending: Gynecologic Oncology | Admitting: Gynecologic Oncology

## 2015-11-19 ENCOUNTER — Encounter (HOSPITAL_COMMUNITY): Payer: Self-pay

## 2015-11-19 DIAGNOSIS — Z8543 Personal history of malignant neoplasm of ovary: Secondary | ICD-10-CM | POA: Insufficient documentation

## 2015-11-19 DIAGNOSIS — R591 Generalized enlarged lymph nodes: Secondary | ICD-10-CM

## 2015-11-19 DIAGNOSIS — R590 Localized enlarged lymph nodes: Secondary | ICD-10-CM | POA: Diagnosis not present

## 2015-11-19 DIAGNOSIS — R918 Other nonspecific abnormal finding of lung field: Secondary | ICD-10-CM | POA: Diagnosis not present

## 2015-11-19 DIAGNOSIS — R599 Enlarged lymph nodes, unspecified: Secondary | ICD-10-CM

## 2015-11-19 MED ORDER — IOHEXOL 300 MG/ML  SOLN
75.0000 mL | Freq: Once | INTRAMUSCULAR | Status: AC | PRN
  Administered 2015-11-19: 75 mL via INTRAVENOUS

## 2015-11-19 MED ORDER — GADOBENATE DIMEGLUMINE 529 MG/ML IV SOLN
20.0000 mL | Freq: Once | INTRAVENOUS | Status: AC | PRN
  Administered 2015-11-19: 20 mL via INTRAVENOUS

## 2015-11-21 ENCOUNTER — Telehealth: Payer: Self-pay | Admitting: Gynecologic Oncology

## 2015-11-21 NOTE — Telephone Encounter (Signed)
Returned call to patient.  Patient wanting results of recent scans.  Informed of results and of Dr. Serita Grit recommendations for repeat imaging in three months.  Verbalizing understanding.  Advised to call for any needs or concerns.

## 2015-11-24 ENCOUNTER — Telehealth: Payer: Self-pay

## 2015-11-24 MED ORDER — TRAMADOL HCL 50 MG PO TABS: 50.0000 mg | ORAL_TABLET | Freq: Three times a day (TID) | ORAL | Status: DC | PRN

## 2015-11-24 NOTE — Telephone Encounter (Signed)
Patient called requesting refill on Ultram 50 MG PO Every 8 hours , Melissa Cross , APNP updated. Orders received to "OK" to refill Ultram 50 MG PO evry 8 hours PRN . [facility] contacted , patient aware that prescription was called in .

## 2015-12-01 ENCOUNTER — Telehealth: Payer: Self-pay | Admitting: Gynecologic Oncology

## 2015-12-01 NOTE — Telephone Encounter (Signed)
Returned call to [facility] with [facility].  She had left message stating the wound vac is being removed because the wound has healed and no longer needs negative pressure therapy.  Wanting to know what dressing to use next.  Advised to begin a wet to dry dressing twice daily everyday per [name].  Advised to contact our office for any questions or concerns.

## 2015-12-18 ENCOUNTER — Telehealth: Payer: Self-pay

## 2015-12-18 MED ORDER — TRAMADOL HCL 50 MG PO TABS: 50.0000 mg | ORAL_TABLET | Freq: Three times a day (TID) | ORAL | Status: DC | PRN

## 2015-12-18 NOTE — Telephone Encounter (Signed)
Patient called requesting refill on her Ultram 50 MG PO every 8 hours PRN for pain , QTY: 30 , no refills. Melissa Cross , APNP aware and the refill request was approved.

## 2016-01-19 ENCOUNTER — Telehealth: Payer: Self-pay | Admitting: Gynecologic Oncology

## 2016-01-19 NOTE — Telephone Encounter (Signed)
Patient wanting to reschedule her appt for May to have her scans prior to her appt on the same day.  Stating her family only has one car and she would need to do everything same day.  Stating her abdominal incision has "pretty much healed over."  No concerns voiced.  Stating she is doing well.  Advised we would call her back with an appt for her scans and her office visit.

## 2016-01-21 ENCOUNTER — Ambulatory Visit: Payer: BLUE CROSS/BLUE SHIELD | Admitting: Gynecologic Oncology

## 2016-01-22 ENCOUNTER — Other Ambulatory Visit: Payer: Self-pay

## 2016-01-28 ENCOUNTER — Other Ambulatory Visit: Payer: Self-pay

## 2016-01-28 ENCOUNTER — Other Ambulatory Visit: Payer: Self-pay | Admitting: Gynecologic Oncology

## 2016-01-28 DIAGNOSIS — R918 Other nonspecific abnormal finding of lung field: Secondary | ICD-10-CM

## 2016-01-28 DIAGNOSIS — D3910 Neoplasm of uncertain behavior of unspecified ovary: Secondary | ICD-10-CM

## 2016-01-29 ENCOUNTER — Telehealth: Payer: Self-pay

## 2016-01-29 NOTE — Telephone Encounter (Signed)
Patient contacted and updated with all appointments have been scheduled for May 22 , 2017 . CT scan 11:45 AM labs prior to CT scan ( I stat labs ) , MRI and follow up with Dr [name] at 2:45 PM as requested by the patient due to transportation issues. Patient is aware that she requires to be NPO 4 hours prior to the procedures . Melissa Cross ,APNP updated with MD follow up and scans have been completed.

## 2016-02-23 ENCOUNTER — Ambulatory Visit: Payer: BLUE CROSS/BLUE SHIELD | Attending: Gynecologic Oncology | Admitting: Gynecologic Oncology

## 2016-02-23 ENCOUNTER — Encounter (HOSPITAL_COMMUNITY): Payer: Self-pay

## 2016-02-23 ENCOUNTER — Ambulatory Visit (HOSPITAL_COMMUNITY)
Admission: RE | Admit: 2016-02-23 | Discharge: 2016-02-23 | Disposition: A | Discharge status: Home / Self Care | Payer: BLUE CROSS/BLUE SHIELD | Source: Ambulatory Visit | Attending: Gynecologic Oncology | Admitting: Gynecologic Oncology

## 2016-02-23 ENCOUNTER — Encounter: Payer: Self-pay | Admitting: Gynecologic Oncology

## 2016-02-23 ENCOUNTER — Other Ambulatory Visit (HOSPITAL_BASED_OUTPATIENT_CLINIC_OR_DEPARTMENT_OTHER): Payer: BLUE CROSS/BLUE SHIELD

## 2016-02-23 ENCOUNTER — Telehealth: Payer: Self-pay

## 2016-02-23 VITALS — BP 116/68 | HR 78 | Temp 97.7°F | Resp 17 | Ht 64.0 in | Wt 228.3 lb

## 2016-02-23 DIAGNOSIS — R590 Localized enlarged lymph nodes: Secondary | ICD-10-CM | POA: Diagnosis not present

## 2016-02-23 DIAGNOSIS — Z8543 Personal history of malignant neoplasm of ovary: Secondary | ICD-10-CM | POA: Diagnosis not present

## 2016-02-23 DIAGNOSIS — K760 Fatty (change of) liver, not elsewhere classified: Secondary | ICD-10-CM | POA: Insufficient documentation

## 2016-02-23 DIAGNOSIS — I10 Essential (primary) hypertension: Secondary | ICD-10-CM | POA: Insufficient documentation

## 2016-02-23 DIAGNOSIS — D1771 Benign lipomatous neoplasm of kidney: Secondary | ICD-10-CM | POA: Diagnosis not present

## 2016-02-23 DIAGNOSIS — R1909 Other intra-abdominal and pelvic swelling, mass and lump: Secondary | ICD-10-CM | POA: Diagnosis not present

## 2016-02-23 DIAGNOSIS — J45909 Unspecified asthma, uncomplicated: Secondary | ICD-10-CM | POA: Diagnosis not present

## 2016-02-23 DIAGNOSIS — F329 Major depressive disorder, single episode, unspecified: Secondary | ICD-10-CM | POA: Insufficient documentation

## 2016-02-23 DIAGNOSIS — D3910 Neoplasm of uncertain behavior of unspecified ovary: Secondary | ICD-10-CM

## 2016-02-23 DIAGNOSIS — Z90710 Acquired absence of both cervix and uterus: Secondary | ICD-10-CM | POA: Diagnosis not present

## 2016-02-23 DIAGNOSIS — R918 Other nonspecific abnormal finding of lung field: Secondary | ICD-10-CM | POA: Diagnosis not present

## 2016-02-23 DIAGNOSIS — J8410 Pulmonary fibrosis, unspecified: Secondary | ICD-10-CM | POA: Diagnosis not present

## 2016-02-23 DIAGNOSIS — Z7984 Long term (current) use of oral hypoglycemic drugs: Secondary | ICD-10-CM | POA: Diagnosis not present

## 2016-02-23 DIAGNOSIS — M1990 Unspecified osteoarthritis, unspecified site: Secondary | ICD-10-CM | POA: Diagnosis not present

## 2016-02-23 DIAGNOSIS — N838 Other noninflammatory disorders of ovary, fallopian tube and broad ligament: Secondary | ICD-10-CM | POA: Insufficient documentation

## 2016-02-23 DIAGNOSIS — F419 Anxiety disorder, unspecified: Secondary | ICD-10-CM | POA: Insufficient documentation

## 2016-02-23 DIAGNOSIS — G4730 Sleep apnea, unspecified: Secondary | ICD-10-CM | POA: Insufficient documentation

## 2016-02-23 DIAGNOSIS — E119 Type 2 diabetes mellitus without complications: Secondary | ICD-10-CM | POA: Diagnosis not present

## 2016-02-23 DIAGNOSIS — Z87440 Personal history of urinary (tract) infections: Secondary | ICD-10-CM | POA: Diagnosis not present

## 2016-02-23 DIAGNOSIS — D3912 Neoplasm of uncertain behavior of left ovary: Secondary | ICD-10-CM

## 2016-02-23 DIAGNOSIS — R599 Enlarged lymph nodes, unspecified: Secondary | ICD-10-CM | POA: Diagnosis not present

## 2016-02-23 HISTORY — DX: Neoplasm of uncertain behavior of unspecified ovary: D39.10

## 2016-02-23 LAB — POCT I-STAT CREATININE: Creatinine, Ser: 0.8 mg/dL (ref 0.44–1.00)

## 2016-02-23 MED ORDER — GADOBENATE DIMEGLUMINE 529 MG/ML IV SOLN
20.0000 mL | Freq: Once | INTRAVENOUS | Status: AC | PRN
  Administered 2016-02-23: 20 mL via INTRAVENOUS

## 2016-02-23 MED ORDER — TRAMADOL HCL 50 MG PO TABS
50.0000 mg | ORAL_TABLET | Freq: Three times a day (TID) | ORAL | Status: DC | PRN
Start: 2016-02-23 — End: 2016-05-19

## 2016-02-23 MED ORDER — IOPAMIDOL (ISOVUE-300) INJECTION 61%
75.0000 mL | Freq: Once | INTRAVENOUS | Status: AC | PRN
  Administered 2016-02-23: 75 mL via INTRAVENOUS

## 2016-02-23 NOTE — Addendum Note (Signed)
Addended by: [name] D on: 02/23/2016 03:55 PM   Modules accepted: Orders

## 2016-02-23 NOTE — Progress Notes (Signed)
Follow Up Note: Gyn-Onc  Melissa Richards 50 y.o. female  CC:  Chief Complaint  Patient presents with  . 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   1/ Wound dehiscence postop: wound completely healed.  2/ Stage IIIC serous LMP of ovary: 1cm right para-colic gutter lesion on MR abdomen. Will repeat in 3 months. Asymptomatic. Would not intervene surgically at this time given anticipated morbidity with laparotomy and small size of lesion and asymptomatic nature. Will re-image with repeat MR in 3 months.  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 Will not continue to follow these sites with reimaging.  HPI:  Melissa Richards is a 50 year old G2P2 initially seen at the request of Dr Carlena Bjornstad for a large left ovarian mass and elevated CA 125 on November 1st, 2016.  She reported a 2 month history of increasing left sided abdominal discomfort and constipation and presented to the [facility] ER on 07/17/15 with symptoms of pelvic swelling, left lower back and abdominal pain.  A CT of the abdomen and pelvis on 07/17/15 showed bibasilar nodularity, a subcarinal 1.4cm node suggestive of infrahilar adenopathy, multiple small retroperitoneal nodes (none pathologic by size criteria) including a 58mm aortocaval node and a 1.5cm gastrohepatic ligament node. There is a portacaval node measuring 2.5 x 3.7cm (image/series 21/2) and a 1.2cm right external iliac node.  There is soft tissue fullness extending superiorly off the right ovary (measuring 6x10cm) and a dominant left ovarian cystic mass arising from the pelvis measuring 18 x  22.5cm.   A CA 125 was drawn on 07/23/15 and was elevated at 2782. (CEA was normal at 0.4).  Medical history significant for morbid obesity, OSA, asthma and non-cardiac shortness of breath. She underwent coronary catheterization 2 years ago for this, which was negative for blockage, and she did not require stenting, or antiplatelet therapy. She was referred to pulmonology who diagnosed her as having asthma. She is a diabetic.  A Chest Ct on 07/30/15 Revealed: 1. Bilateral small pulmonary nodules are concerning for pulmonary metastasis. 2. Mild mediastinal and bilateral hilar adenopathy is also concerning for metastatic disease. 3. Mildly enlarged LEFT supraclavicular lymph node is concerning for metastatic disease. This lymph node may be amenable to ultrasound-guided biopsy 4. Enlargement of the LEFT lobe of thyroid gland likely represents benign goiter.    Due to the findings of unresectable chest and upper abdominal disease, she was scheduled for neoadjuvant chemotherapy following histologic confirmation via biopsy.  Biopsy of the supraclavicular node on 08/05/15 revealed FOCAL CLUSTERS WITH GRANULOMAS, SEE COMMENT. LYMPHOID TISSUE PRESENT. NO MALIGNANT CELLS IDENTIFIED.  Paracentesis of the ascites and biopsy of the pelvic soft tissue mass on 08/18/15 revealed SEROUS TUMOR WITH FOCAL PROLIFERATION, with the ascites showing papillary fragments of serous tumor (at least LMP).  Due to these inconclusive findings for malignancy and a concern that this is a low malignant potential tumor which would not be responsive to chemotherapy, chemotherapy was cancelled and surgery was scheduled. If this disease is LMP, it is likely that the chest and upper abdominal adenopathy is non-malignant.  On 09/04/15, she underwent an exploratory laparotomy with TAH, bilateral salpingo-oophorectomy, omentectomy radical tumor debulking for ovarian cancer.  22 modifier for extreme intraperitoneal obesity making surgical exposure more  complex and increasing duration of procedure by 1 hour by Dr. [name].  Final pathology revealed: 1. 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 - NON-INVASIVE IMPLANT ON FALLOPIAN TUBE SEROSA. 2. Adnexa - ovary +/- tube, neoplastic, right - SEROUS BORDERLINE TUMOR, 8 CM WITH OVARIAN SURFACE INVOLVEMENT. - NON-INVASIVE IMPLANT ON FALLOPIAN TUBE SEROSA. 3. Uterus and cervix - CERVIX: NABOTHIAN CYST. - ENDOMETRIUM: INACTIVE. NO EVIDENCE OF HYPERPLASIA OR CARCINOMA. - MYOMETRIUM: LEIOMYOMA. - UTERINE SEROSA AND ATTACHED SOFT TISSUE: NON-INVASIVE IMPLANTS OF SEROUS BORDERLINE TUMOR. 4. Omentum, resection for tumor - MICROSCOPIC NON-INVASIVE IMPLANT OF SEROUS BORDERLINE TUMOR. - FOCI OF INFLAMMATION AND HYPEREMIA. 5. Soft tissue mass, simple excision, sigmoid - SEROUS BORDERLINE TUMOR, 8 CM CONSISTENT WITH NON-INVASIVE IMPLANT.   At her postoperative evaluation she had staples removed and dehiscence of her wound. A wound vac was applied and managed as an outpatient. There was no cellulitis or infection.  Interval History:    The patient had application of a wound vac for approximately 3 months. The wound completely healed.  She had repeat imaging with CT chest on 02/23/16 and MR abdomen on 02/23/16. C CT of the chest showed stable supraclavicular and hilar lymphadenopathy consistent with a diagnosis of sarcoidosis and unlikely to be reflective of metastatic borderline tumor The MRI of the abdomen demonstrated stable porta hepatis nodes also consistent with likely sarcoidosis and not metastatic disease. However a new enhancing 1 cm nodule was identified in the right paracolic gutter that was slightly increased since the February 2017 MRI could not exclude recurrent tumor implant. There was no other evidence of peritoneal metastases or ascites.  She is feeling well with no pain and normal bowel function.   Review of Systems   Constitutional: Feels well.  Denies fever, chills, early satiety, unintentional weight loss or gain.  Cardiovascular: No chest pain, shortness of breath, or edema.  Pulmonary: No cough or wheeze.  Gastrointestinal: No nausea, vomiting, or diarrhea. No bright red blood per rectum or change in bowel movement.  Genitourinary: No frequency, urgency, or dysuria. No vaginal bleeding or discharge.  Musculoskeletal: No myalgia or joint pain. Neurologic: No weakness, numbness, or change in gait.  Psychology: No depression, anxiety, or insomnia.  Current Meds:  Outpatient Encounter Prescriptions as of 02/23/2016  Medication Sig  . atenolol (TENORMIN) 25 MG tablet Take 25 mg by mouth daily.  Marland Kitchen atorvastatin (LIPITOR) 40 MG tablet Take 40 mg by mouth daily at 6 PM.   . busPIRone (BUSPAR) 15 MG tablet Take 15 mg by mouth 2 (two) times daily.  . citalopram (CELEXA) 40 MG tablet Take 40 mg by mouth daily.  Marland Kitchen lisinopril (PRINIVIL,ZESTRIL) 20 MG tablet Take 20 mg by mouth daily.  . metFORMIN (GLUCOPHAGE) 500 MG tablet Take 500 mg by mouth daily with breakfast.   . Multiple Vitamin (MULTIVITAMIN WITH MINERALS) TABS tablet Take 1 tablet by mouth daily.  . traMADol (ULTRAM) 50 MG tablet Take 1 tablet (50 mg total) by mouth every 8 (eight) hours as needed.  . traZODone (DESYREL) 50 MG tablet Take 50 mg by mouth at bedtime.  Marland Kitchen albuterol (PROVENTIL HFA;VENTOLIN HFA) 108 (90 BASE) MCG/ACT inhaler Inhale 1-2 puffs into the lungs every 6 (six) hours as needed for wheezing or shortness of breath. Reported on 02/23/2016  . budesonide-formoterol (SYMBICORT) 160-4.5 MCG/ACT inhaler Inhale 2 puffs into the lungs 2 (two) times daily. Reported  on 02/23/2016  . [DISCONTINUED] dexamethasone (DECADRON) 4 MG tablet Take 5 tablets with food(=20 mg) 12 hrs and 6 hrs prior to Taxol chemotherapy (Patient not taking: Reported on 10/30/2015)  . [DISCONTINUED] ondansetron (ZOFRAN) 8 MG tablet Take 1 tablet (8 mg total) by mouth every 8  (eight) hours as needed for nausea. Will not make drowsy. (Patient not taking: Reported on 10/30/2015)  . [DISCONTINUED] promethazine (PHENERGAN) 25 MG tablet Take 1 tablet (25 mg total) by mouth every 6 (six) hours as needed for nausea or vomiting. (Patient not taking: Reported on 10/30/2015)   No facility-administered encounter medications on file as of 02/23/2016.    Allergy:  Allergies  Allergen Reactions  . Hydrocodone Itching    Can only take w benadryl    Social Hx:   Social History   Social History  . Marital Status: Married    Spouse Name: N/A  . Number of Children: N/A  . Years of Education: N/A   Occupational History  . Not on file.   Social History Main Topics  . Smoking status: Never Smoker   . Smokeless tobacco: Never Used  . Alcohol Use: No  . Drug Use: No  . Sexual Activity: Not on file   Other Topics Concern  . Not on file   Social History Narrative    Past Surgical Hx:  Past Surgical History  Procedure Laterality Date  . Tubal ligation    . Tonsillectomy    . Cardiac catheterization      2 years ago  . Abdominal hysterectomy Bilateral 09/04/2015    Procedure: HYSTERECTOMY TOTAL ABDOMINAL, EXPLORATORY LAPAROTOMY, BILATERAL SALPINGO OOPHERECTOMY, OMENTECTOMY, DEBULKING;  Surgeon: [name], MD;  Location: [facility];  Service: Gynecology;  Laterality: Bilateral;    Past Medical Hx:  Past Medical History  Diagnosis Date  . Arthritis   . Asthma   . Depression   . Diabetes mellitus without complication ([place])   . Hypertension   . Family history of adverse reaction to anesthesia     FAMILY MEMBERS ARE SLOW TO WAKE UP  . Numbness and tingling     FEET /LEGS  . Constipation   . Recurrent UTI   . Sleep apnea     DOES NOT USE C -PAP  . Anxiety   . Adnexal mass     BILATERAL  . ovarian ca dx'd 07/2015    Family Hx:  Family History  Problem Relation Age of Onset  . Anesthesia problems Mother   . Diabetes Mother   . Hypertension Mother   .  Cancer Father   . Diabetes Brother     Vitals:  Blood pressure 116/68, pulse 78, temperature 97.7 F (36.5 C), temperature source Oral, resp. rate 17, height 5\' 4"  (1.626 m), weight 228 lb 4.8 oz (103.556 kg), SpO2 99 %.  Physical Exam:  General: Well developed, well nourished female in no acute distress. Alert and oriented x 3.  Cardiovascular: Regular rate and rhythm. S1 and S2 normal.  Lungs: Clear to auscultation bilaterally. No wheezes/crackles/rhonchi noted.  Skin: No rashes or lesions present. Back: No CVA tenderness.  Genitourinary: vaginal cuff well healed, no bleeding or discharge.No pelvic masses Abdomen: Abdomen soft, non-tender and morbidly obese. Wound completely healed. Extremities: No bilateral cyanosis, edema, or clubbing.    Donaciano Eva, MD  02/23/2016, 3:28 PM

## 2016-02-23 NOTE — Patient Instructions (Addendum)
MRI and follow up scheduled on August 16 , 2017 , call with changes , questions or concerns.  Thank you !

## 2016-02-23 NOTE — Telephone Encounter (Signed)
Patient contacted and updated again with having I stat labs prior to CT Scan in the radiology department , patient was reassured that the appropriate labs would be drawn. Patient states understanding , patient aware that she has a follow up appointment with Dr [name] today at 2:45 PM . Patient denies further questions at this time.

## 2016-02-24 ENCOUNTER — Telehealth: Payer: Self-pay

## 2016-02-24 LAB — CA 125: Cancer Antigen (CA) 125: 24.7 U/mL (ref 0.0–38.1)

## 2016-02-24 LAB — CANCER ANTIGEN 125 (PARALLEL TESTING): CA 125: 32 U/mL (ref <35)

## 2016-02-24 NOTE — Telephone Encounter (Signed)
Orders received from [facility] to contact the patient to update with her CA 125 level that was drawn yesterday after her follow up visit with Dr [name] . CA 125 level was (24.7)  Normal  0.0 - 38.1 . Patient contacted and updated , patient states understanding , denies further questions at this time , will call with any changes , questions or concerns.

## 2016-03-02 ENCOUNTER — Telehealth: Payer: Self-pay | Admitting: Gynecologic Oncology

## 2016-03-02 NOTE — Telephone Encounter (Signed)
Returned call to patient.  All questions answered.  Patient had called earlier asking if the new lesion on her last scan could be sarcoidosis.  Per [name], new lesion is not sarcoidosis since sarcoidosis is in the lymph nodes.  Patient advised the lesion is either scarring or the borderline tumor.  Advised to follow up as planned and to call for any questions or concerns.

## 2016-05-19 ENCOUNTER — Ambulatory Visit (HOSPITAL_COMMUNITY)
Admission: RE | Admit: 2016-05-19 | Discharge: 2016-05-19 | Disposition: A | Discharge status: Home / Self Care | Payer: BLUE CROSS/BLUE SHIELD | Source: Ambulatory Visit | Attending: Gynecologic Oncology | Admitting: Gynecologic Oncology

## 2016-05-19 ENCOUNTER — Telehealth: Payer: Self-pay | Admitting: Gynecologic Oncology

## 2016-05-19 ENCOUNTER — Ambulatory Visit: Payer: BLUE CROSS/BLUE SHIELD | Attending: Gynecologic Oncology | Admitting: Gynecologic Oncology

## 2016-05-19 ENCOUNTER — Encounter: Payer: Self-pay | Admitting: Gynecologic Oncology

## 2016-05-19 ENCOUNTER — Other Ambulatory Visit (HOSPITAL_BASED_OUTPATIENT_CLINIC_OR_DEPARTMENT_OTHER): Payer: BLUE CROSS/BLUE SHIELD

## 2016-05-19 VITALS — BP 164/90 | HR 91 | Temp 98.3°F | Resp 18 | Ht 64.0 in | Wt 236.8 lb

## 2016-05-19 DIAGNOSIS — Z833 Family history of diabetes mellitus: Secondary | ICD-10-CM | POA: Insufficient documentation

## 2016-05-19 DIAGNOSIS — R599 Enlarged lymph nodes, unspecified: Secondary | ICD-10-CM

## 2016-05-19 DIAGNOSIS — D1771 Benign lipomatous neoplasm of kidney: Secondary | ICD-10-CM | POA: Diagnosis not present

## 2016-05-19 DIAGNOSIS — E119 Type 2 diabetes mellitus without complications: Secondary | ICD-10-CM | POA: Diagnosis not present

## 2016-05-19 DIAGNOSIS — R590 Localized enlarged lymph nodes: Secondary | ICD-10-CM | POA: Insufficient documentation

## 2016-05-19 DIAGNOSIS — I10 Essential (primary) hypertension: Secondary | ICD-10-CM | POA: Insufficient documentation

## 2016-05-19 DIAGNOSIS — Z7984 Long term (current) use of oral hypoglycemic drugs: Secondary | ICD-10-CM | POA: Insufficient documentation

## 2016-05-19 DIAGNOSIS — Z809 Family history of malignant neoplasm, unspecified: Secondary | ICD-10-CM | POA: Insufficient documentation

## 2016-05-19 DIAGNOSIS — K760 Fatty (change of) liver, not elsewhere classified: Secondary | ICD-10-CM | POA: Insufficient documentation

## 2016-05-19 DIAGNOSIS — Z90722 Acquired absence of ovaries, bilateral: Secondary | ICD-10-CM | POA: Insufficient documentation

## 2016-05-19 DIAGNOSIS — Z90710 Acquired absence of both cervix and uterus: Secondary | ICD-10-CM | POA: Insufficient documentation

## 2016-05-19 DIAGNOSIS — M1990 Unspecified osteoarthritis, unspecified site: Secondary | ICD-10-CM | POA: Diagnosis not present

## 2016-05-19 DIAGNOSIS — Z8249 Family history of ischemic heart disease and other diseases of the circulatory system: Secondary | ICD-10-CM | POA: Diagnosis not present

## 2016-05-19 DIAGNOSIS — D3910 Neoplasm of uncertain behavior of unspecified ovary: Secondary | ICD-10-CM

## 2016-05-19 DIAGNOSIS — Z9889 Other specified postprocedural states: Secondary | ICD-10-CM | POA: Diagnosis not present

## 2016-05-19 DIAGNOSIS — Z885 Allergy status to narcotic agent status: Secondary | ICD-10-CM | POA: Diagnosis not present

## 2016-05-19 DIAGNOSIS — G4730 Sleep apnea, unspecified: Secondary | ICD-10-CM | POA: Insufficient documentation

## 2016-05-19 DIAGNOSIS — Z87440 Personal history of urinary (tract) infections: Secondary | ICD-10-CM | POA: Diagnosis not present

## 2016-05-19 LAB — POCT I-STAT CREATININE: CREATININE: 0.8 mg/dL (ref 0.44–1.00)

## 2016-05-19 MED ORDER — GADOBENATE DIMEGLUMINE 529 MG/ML IV SOLN
20.0000 mL | Freq: Once | INTRAVENOUS | Status: AC | PRN
  Administered 2016-05-19: 20 mL via INTRAVENOUS

## 2016-05-19 NOTE — Telephone Encounter (Signed)
Patient informed of MRI results.  Advised to follow up as planned.  Advised we will contact her with the results of her CA 125 from today.  No concerns voiced.

## 2016-05-19 NOTE — Patient Instructions (Signed)
We will contact you with the results of your MRI once read by the radiologist and your CA 125 from today.  Plan to follow up in November or sooner if needed.

## 2016-05-19 NOTE — Progress Notes (Signed)
Follow Up Note: Gyn-Onc  Melissa Richards 50 y.o. female  CC:  Chief Complaint  Patient presents with  . 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   1/ Wound dehiscence postop: wound completely healed.  2/ Stage IIIC serous LMP of ovary: 1cm right para-colic gutter lesion on MR abdomen. Today's follow-up MRI pending. Asymptomatic. Would not intervene surgically at this time given anticipated morbidity with laparotomy and small size of lesion and asymptomatic nature. If stable and stable CA 125, will follow with CA 125's and annual MRI.  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 Will not continue to follow these sites with reimaging.  Follow-up in 3 months (November 2017) with exam, CA 125.  HPI:  Melissa Richards is a 50 year old G2P2 initially seen at the request of Dr Carlena Bjornstad for a large left ovarian mass and elevated CA 125 on November 1st, 2016.  She reported a 2 month history of increasing left sided abdominal discomfort and constipation and presented to the [facility] ER on 07/17/15 with symptoms of pelvic swelling, left lower back and abdominal pain.  A CT of the abdomen and pelvis on 07/17/15 showed bibasilar nodularity, a subcarinal 1.4cm node suggestive of infrahilar adenopathy, multiple small retroperitoneal nodes (none pathologic by size criteria) including a 52mm aortocaval node and a 1.5cm gastrohepatic ligament node. There is a portacaval node measuring 2.5 x 3.7cm (image/series 21/2) and a 1.2cm right external iliac node.  There is soft tissue fullness extending superiorly off the right ovary  (measuring 6x10cm) and a dominant left ovarian cystic mass arising from the pelvis measuring 18 x 22.5cm.   A CA 125 was drawn on 07/23/15 and was elevated at 2782. (CEA was normal at 0.4).  Medical history significant for morbid obesity, OSA, asthma and non-cardiac shortness of breath. She underwent coronary catheterization 2 years ago for this, which was negative for blockage, and she did not require stenting, or antiplatelet therapy. She was referred to pulmonology who diagnosed her as having asthma. She is a diabetic.  A Chest Ct on 07/30/15 Revealed: 1. Bilateral small pulmonary nodules are concerning for pulmonary metastasis. 2. Mild mediastinal and bilateral hilar adenopathy is also concerning for metastatic disease. 3. Mildly enlarged LEFT supraclavicular lymph node is concerning for metastatic disease. This lymph node may be amenable to ultrasound-guided biopsy 4. Enlargement of the LEFT lobe of thyroid gland likely represents benign goiter.    Due to the findings of unresectable chest and upper abdominal disease, she was scheduled for neoadjuvant chemotherapy following histologic confirmation via biopsy.  Biopsy of the supraclavicular node on 08/05/15 revealed FOCAL CLUSTERS WITH GRANULOMAS, SEE COMMENT. LYMPHOID TISSUE PRESENT. NO MALIGNANT CELLS IDENTIFIED.  Paracentesis of the ascites and biopsy of the pelvic soft tissue mass on 08/18/15 revealed SEROUS TUMOR WITH FOCAL PROLIFERATION, with the ascites showing papillary fragments of serous tumor (at least LMP).  Due to these inconclusive findings for malignancy and a concern that this is a low malignant potential tumor which would not be responsive to chemotherapy, chemotherapy was cancelled and surgery was scheduled. If this disease is LMP, it is likely that the chest and upper abdominal adenopathy is non-malignant.  On  09/04/15, she underwent an exploratory laparotomy with TAH, bilateral salpingo-oophorectomy, omentectomy radical tumor debulking  for ovarian cancer. 22 modifier for extreme intraperitoneal obesity making surgical exposure more complex and increasing duration of procedure by 1 hour by Dr. [name].  Final pathology revealed: 1. 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 - NON-INVASIVE IMPLANT ON FALLOPIAN TUBE SEROSA. 2. Adnexa - ovary +/- tube, neoplastic, right - SEROUS BORDERLINE TUMOR, 8 CM WITH OVARIAN SURFACE INVOLVEMENT. - NON-INVASIVE IMPLANT ON FALLOPIAN TUBE SEROSA. 3. Uterus and cervix - CERVIX: NABOTHIAN CYST. - ENDOMETRIUM: INACTIVE. NO EVIDENCE OF HYPERPLASIA OR CARCINOMA. - MYOMETRIUM: LEIOMYOMA. - UTERINE SEROSA AND ATTACHED SOFT TISSUE: NON-INVASIVE IMPLANTS OF SEROUS BORDERLINE TUMOR. 4. Omentum, resection for tumor - MICROSCOPIC NON-INVASIVE IMPLANT OF SEROUS BORDERLINE TUMOR. - FOCI OF INFLAMMATION AND HYPEREMIA. 5. Soft tissue mass, simple excision, sigmoid - SEROUS BORDERLINE TUMOR, 8 CM CONSISTENT WITH NON-INVASIVE IMPLANT.   At her postoperative evaluation she had staples removed and dehiscence of her wound. A wound vac was applied and managed as an outpatient. There was no cellulitis or infection.  The patient had application of a wound vac for approximately 3 months. The wound completely healed.   She had repeat imaging with CT chest on 02/23/16 and MR abdomen on 02/23/16. C CT of the chest showed stable supraclavicular and hilar lymphadenopathy consistent with a diagnosis of sarcoidosis and unlikely to be reflective of metastatic borderline tumor The MRI of the abdomen demonstrated stable porta hepatis nodes also consistent with likely sarcoidosis and not metastatic disease. However a new enhancing 1 cm nodule was identified in the right paracolic gutter that was slightly increased since the February 2017 MRI could not exclude recurrent tumor implant. There was no other evidence of peritoneal metastases or ascites.  Interval History:    Stable weight. She is feeling well with no pain and normal bowel function.   Review of Systems  Constitutional: Feels well.  Denies fever, chills, early satiety, unintentional weight loss or gain.  Cardiovascular: No chest pain, shortness of breath, or edema.  Pulmonary: No cough or wheeze.  Gastrointestinal: No nausea, vomiting, or diarrhea. No bright red blood per rectum or change in bowel movement.  Genitourinary: No frequency, urgency, or dysuria. No vaginal bleeding or discharge.  Musculoskeletal: No myalgia or joint pain. Neurologic: No weakness, numbness, or change in gait.  Psychology: No depression, anxiety, or insomnia.  Current Meds:  Outpatient Encounter Prescriptions as of 05/19/2016  Medication Sig  . atenolol (TENORMIN) 25 MG tablet Take 25 mg by mouth daily.  Marland Kitchen atorvastatin (LIPITOR) 40 MG tablet Take 40 mg by mouth daily at 6 PM.   . budesonide-formoterol (SYMBICORT) 160-4.5 MCG/ACT inhaler Inhale 2 puffs into the lungs 2 (two) times daily. Reported on 02/23/2016  . busPIRone (BUSPAR) 15 MG tablet Take 15 mg by mouth 2 (two) times daily.  . citalopram (CELEXA) 40 MG tablet Take 40 mg by mouth daily.  Marland Kitchen lisinopril (PRINIVIL,ZESTRIL) 20 MG tablet Take 20 mg by mouth daily.  . metFORMIN (GLUCOPHAGE) 500 MG tablet Take 500 mg by mouth daily with breakfast.   . Multiple Vitamin (MULTIVITAMIN WITH MINERALS) TABS tablet Take 1 tablet by mouth daily.  . traZODone (DESYREL) 50 MG tablet Take 50 mg by mouth at bedtime.  Marland Kitchen albuterol (PROVENTIL HFA;VENTOLIN HFA) 108 (90 BASE) MCG/ACT inhaler Inhale 1-2 puffs into the lungs every 6 (six) hours as needed for wheezing or shortness of breath. Reported on 02/23/2016  . [  DISCONTINUED] traMADol (ULTRAM) 50 MG tablet Take 1 tablet (50 mg total) by mouth every 8 (eight) hours as needed.   No facility-administered encounter medications on file as of 05/19/2016.     Allergy:  Allergies  Allergen Reactions  . Hydrocodone Itching    Can only  take w benadryl    Social Hx:   Social History   Social History  . Marital status: Married    Spouse name: N/A  . Number of children: N/A  . Years of education: N/A   Occupational History  . Not on file.   Social History Main Topics  . Smoking status: Never Smoker  . Smokeless tobacco: Never Used  . Alcohol use No  . Drug use: No  . Sexual activity: Not on file   Other Topics Concern  . Not on file   Social History Narrative  . No narrative on file    Past Surgical Hx:  Past Surgical History:  Procedure Laterality Date  . ABDOMINAL HYSTERECTOMY Bilateral 09/04/2015   Procedure: HYSTERECTOMY TOTAL ABDOMINAL, EXPLORATORY LAPAROTOMY, BILATERAL SALPINGO OOPHERECTOMY, OMENTECTOMY, DEBULKING;  Surgeon: [name], MD;  Location: [facility];  Service: Gynecology;  Laterality: Bilateral;  . CARDIAC CATHETERIZATION     2 years ago  . TONSILLECTOMY    . TUBAL LIGATION      Past Medical Hx:  Past Medical History:  Diagnosis Date  . Adnexal mass    BILATERAL  . Anxiety   . Arthritis   . Asthma   . Constipation   . Depression   . Diabetes mellitus without complication ([place])   . Family history of adverse reaction to anesthesia    FAMILY MEMBERS ARE SLOW TO WAKE UP  . Hypertension   . Numbness and tingling    FEET /LEGS  . ovarian ca dx'd 07/2015  . Recurrent UTI   . Sleep apnea    DOES NOT USE C -PAP    Family Hx:  Family History  Problem Relation Age of Onset  . Anesthesia problems Mother   . Diabetes Mother   . Hypertension Mother   . Cancer Father   . Diabetes Brother     Vitals:  Blood pressure (!) 164/90, pulse 91, temperature 98.3 F (36.8 C), temperature source Oral, resp. rate 18, height 5\' 4"  (1.626 m), weight 236 lb 12.8 oz (107.4 kg), SpO2 98 %.  Physical Exam:  General: Well developed, well nourished female in no acute distress. Alert and oriented x 3.  Cardiovascular: Regular rate and rhythm. S1 and S2 normal.  Lungs: Clear to auscultation  bilaterally. No wheezes/crackles/rhonchi noted.  Skin: No rashes or lesions present. Back: No CVA tenderness.  Genitourinary: vaginal cuff well healed, no bleeding or discharge.No pelvic masses Abdomen: Abdomen soft, non-tender and morbidly obese. Wound completely healed. Extremities: No bilateral cyanosis, edema, or clubbing.    Melissa Eva, MD  05/19/2016, 2:31 PM   Addendum: MRI showed resolution of right para-colic gutter nodule. Recommendation is for repeat evaluation with physical exam and CA 125 in 3 months. Repeat imaging will be reserved for cases of new elevations in CA 125.

## 2016-05-20 ENCOUNTER — Telehealth: Payer: Self-pay

## 2016-05-20 LAB — CA 125: Cancer Antigen (CA) 125: 23.9 U/mL (ref 0.0–38.1)

## 2016-05-20 LAB — CANCER ANTIGEN 125 (PARALLEL TESTING): CA 125: 31 U/mL (ref <35)

## 2016-05-20 NOTE — Telephone Encounter (Signed)
Orders received from [facility] to contact the patient to update with CA 125 Level drawn yesterday after her follow up visit with Dr [name]. CA 125 Level was 23.9 within reference range. Patient contacted and updated , patient states understanding , denies further questions at this time.

## 2016-08-09 ENCOUNTER — Other Ambulatory Visit (HOSPITAL_BASED_OUTPATIENT_CLINIC_OR_DEPARTMENT_OTHER): Payer: BLUE CROSS/BLUE SHIELD

## 2016-08-09 ENCOUNTER — Ambulatory Visit: Payer: BLUE CROSS/BLUE SHIELD | Attending: Gynecologic Oncology | Admitting: Gynecologic Oncology

## 2016-08-09 ENCOUNTER — Encounter: Payer: Self-pay | Admitting: Gynecologic Oncology

## 2016-08-09 VITALS — BP 153/83 | HR 90 | Temp 98.2°F | Resp 18 | Wt 245.3 lb

## 2016-08-09 DIAGNOSIS — R591 Generalized enlarged lymph nodes: Secondary | ICD-10-CM

## 2016-08-09 DIAGNOSIS — Z87440 Personal history of urinary (tract) infections: Secondary | ICD-10-CM | POA: Insufficient documentation

## 2016-08-09 DIAGNOSIS — R918 Other nonspecific abnormal finding of lung field: Secondary | ICD-10-CM | POA: Insufficient documentation

## 2016-08-09 DIAGNOSIS — F329 Major depressive disorder, single episode, unspecified: Secondary | ICD-10-CM | POA: Diagnosis not present

## 2016-08-09 DIAGNOSIS — D3912 Neoplasm of uncertain behavior of left ovary: Secondary | ICD-10-CM | POA: Diagnosis not present

## 2016-08-09 DIAGNOSIS — F419 Anxiety disorder, unspecified: Secondary | ICD-10-CM | POA: Diagnosis not present

## 2016-08-09 DIAGNOSIS — I10 Essential (primary) hypertension: Secondary | ICD-10-CM | POA: Diagnosis not present

## 2016-08-09 DIAGNOSIS — D3910 Neoplasm of uncertain behavior of unspecified ovary: Secondary | ICD-10-CM

## 2016-08-09 DIAGNOSIS — Z90722 Acquired absence of ovaries, bilateral: Secondary | ICD-10-CM | POA: Insufficient documentation

## 2016-08-09 DIAGNOSIS — Z9079 Acquired absence of other genital organ(s): Secondary | ICD-10-CM | POA: Insufficient documentation

## 2016-08-09 DIAGNOSIS — Z90710 Acquired absence of both cervix and uterus: Secondary | ICD-10-CM | POA: Diagnosis not present

## 2016-08-09 DIAGNOSIS — E119 Type 2 diabetes mellitus without complications: Secondary | ICD-10-CM | POA: Diagnosis not present

## 2016-08-09 DIAGNOSIS — R590 Localized enlarged lymph nodes: Secondary | ICD-10-CM | POA: Diagnosis not present

## 2016-08-09 DIAGNOSIS — Z7984 Long term (current) use of oral hypoglycemic drugs: Secondary | ICD-10-CM | POA: Diagnosis not present

## 2016-08-09 DIAGNOSIS — M1990 Unspecified osteoarthritis, unspecified site: Secondary | ICD-10-CM | POA: Diagnosis not present

## 2016-08-09 DIAGNOSIS — K5900 Constipation, unspecified: Secondary | ICD-10-CM | POA: Insufficient documentation

## 2016-08-09 DIAGNOSIS — J45909 Unspecified asthma, uncomplicated: Secondary | ICD-10-CM | POA: Insufficient documentation

## 2016-08-09 DIAGNOSIS — Z8543 Personal history of malignant neoplasm of ovary: Secondary | ICD-10-CM | POA: Insufficient documentation

## 2016-08-09 DIAGNOSIS — C562 Malignant neoplasm of left ovary: Secondary | ICD-10-CM

## 2016-08-09 DIAGNOSIS — Z01818 Encounter for other preprocedural examination: Secondary | ICD-10-CM | POA: Insufficient documentation

## 2016-08-09 LAB — COMPREHENSIVE METABOLIC PANEL
ALBUMIN: 3.8 g/dL (ref 3.5–5.0)
ALK PHOS: 109 U/L (ref 40–150)
ALT: 17 U/L (ref 0–55)
ANION GAP: 8 meq/L (ref 3–11)
AST: 15 U/L (ref 5–34)
BUN: 17.2 mg/dL (ref 7.0–26.0)
CALCIUM: 9.9 mg/dL (ref 8.4–10.4)
CO2: 30 mEq/L — ABNORMAL HIGH (ref 22–29)
Chloride: 101 mEq/L (ref 98–109)
Creatinine: 0.9 mg/dL (ref 0.6–1.1)
EGFR: 76 mL/min/{1.73_m2} — AB (ref >90)
Glucose: 162 mg/dl — ABNORMAL HIGH (ref 70–140)
POTASSIUM: 4.2 meq/L (ref 3.5–5.1)
Sodium: 140 mEq/L (ref 136–145)
Total Bilirubin: 0.7 mg/dL (ref 0.20–1.20)
Total Protein: 7.3 g/dL (ref 6.4–8.3)

## 2016-08-09 LAB — CBC WITH DIFFERENTIAL/PLATELET
BASO%: 0.7 % (ref 0.0–2.0)
BASOS ABS: 0.1 10*3/uL (ref 0.0–0.1)
EOS ABS: 0.3 10*3/uL (ref 0.0–0.5)
EOS%: 3.6 % (ref 0.0–7.0)
HEMATOCRIT: 43.3 % (ref 34.8–46.6)
HEMOGLOBIN: 14.6 g/dL (ref 11.6–15.9)
LYMPH#: 1.4 10*3/uL (ref 0.9–3.3)
LYMPH%: 16.2 % (ref 14.0–49.7)
MCH: 29.4 pg (ref 25.1–34.0)
MCHC: 33.7 g/dL (ref 31.5–36.0)
MCV: 87.3 fL (ref 79.5–101.0)
MONO#: 0.7 10*3/uL (ref 0.1–0.9)
MONO%: 8.7 % (ref 0.0–14.0)
NEUT#: 6 10*3/uL (ref 1.5–6.5)
NEUT%: 70.8 % (ref 38.4–76.8)
Platelets: 220 10*3/uL (ref 145–400)
RBC: 4.96 10*6/uL (ref 3.70–5.45)
RDW: 13.1 % (ref 11.2–14.5)
WBC: 8.4 10*3/uL (ref 3.9–10.3)

## 2016-08-09 NOTE — Patient Instructions (Addendum)
We will obtain a CA 125 level today and call you with the results when available.  Plan to follow up in three months or sooner if needed.

## 2016-08-09 NOTE — Progress Notes (Signed)
Follow Up Note: Gyn-Onc  [name] 50 y.o. female  CC:  Chief Complaint  Patient presents with  . 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   1/ Stage IIIC serous LMP of ovary: No evidence of recurrence on August 2017 MRI. Will follow with CA 125's and annual MRI (next due August 2018).  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 Will not continue to follow these sites with reimaging.  Follow-up in 3 months (February 2018) with exam, CA 125.  HPI:  Melissa Richards is a 50 year old G2P2 initially seen at the request of Dr Carlena Bjornstad for a large left ovarian mass and elevated CA 125 on November 1st, 2016.  She reported a 2 month history of increasing left sided abdominal discomfort and constipation and presented to the [facility] ER on 07/17/15 with symptoms of pelvic swelling, left lower back and abdominal pain.  A CT of the abdomen and pelvis on 07/17/15 showed bibasilar nodularity, a subcarinal 1.4cm node suggestive of infrahilar adenopathy, multiple small retroperitoneal nodes (none pathologic by size criteria) including a 43mm aortocaval node and a 1.5cm gastrohepatic ligament node. There is a portacaval node measuring 2.5 x 3.7cm (image/series 21/2) and a 1.2cm right external iliac node.  There is soft tissue fullness extending superiorly off the right ovary (measuring 6x10cm) and a dominant left ovarian cystic mass arising from the pelvis measuring 18 x 22.5cm.   A CA 125 was drawn on 07/23/15 and was elevated at 2782. (CEA was normal at 0.4).  Medical history significant for morbid  obesity, OSA, asthma and non-cardiac shortness of breath. She underwent coronary catheterization 2 years ago for this, which was negative for blockage, and she did not require stenting, or antiplatelet therapy. She was referred to pulmonology who diagnosed her as having asthma. She is a diabetic.  A Chest Ct on 07/30/15 Revealed: 1. Bilateral small pulmonary nodules are concerning for pulmonary metastasis. 2. Mild mediastinal and bilateral hilar adenopathy is also concerning for metastatic disease. 3. Mildly enlarged LEFT supraclavicular lymph node is concerning for metastatic disease. This lymph node may be amenable to ultrasound-guided biopsy 4. Enlargement of the LEFT lobe of thyroid gland likely represents benign goiter.    Due to the findings of unresectable chest and upper abdominal disease, she was scheduled for neoadjuvant chemotherapy following histologic confirmation via biopsy.  Biopsy of the supraclavicular node on 08/05/15 revealed FOCAL CLUSTERS WITH GRANULOMAS, SEE COMMENT. LYMPHOID TISSUE PRESENT. NO MALIGNANT CELLS IDENTIFIED.  Paracentesis of the ascites and biopsy of the pelvic soft tissue mass on 08/18/15 revealed SEROUS TUMOR WITH FOCAL PROLIFERATION, with the ascites showing papillary fragments of serous tumor (at least LMP).  Due to these inconclusive findings for malignancy and a concern that this is a low malignant potential tumor which would not be responsive to chemotherapy, chemotherapy was cancelled and surgery was scheduled. If this disease is LMP, it is likely that the chest and upper abdominal adenopathy is non-malignant.  On 09/04/15, she underwent an exploratory laparotomy with TAH, bilateral salpingo-oophorectomy, omentectomy radical tumor debulking for ovarian cancer. 22 modifier for extreme intraperitoneal obesity making surgical exposure more complex and increasing duration of procedure  by 1 hour by Dr. [name].  Final pathology revealed: 1. Adnexa - ovary +/- tube,  neoplastic - SEROUS BORDERLINE TUMOR, 22 CM WITH OVARIAN SURFACE INVOLVEMENT. - NON-INVASIVE IMPLANT ON FALLOPIAN TUBE SEROSA. 2. Adnexa - ovary +/- tube, neoplastic, right - SEROUS BORDERLINE TUMOR, 8 CM WITH OVARIAN SURFACE INVOLVEMENT. - NON-INVASIVE IMPLANT ON FALLOPIAN TUBE SEROSA. 3. Uterus and cervix - CERVIX: NABOTHIAN CYST. - ENDOMETRIUM: INACTIVE. NO EVIDENCE OF HYPERPLASIA OR CARCINOMA. - MYOMETRIUM: LEIOMYOMA. - UTERINE SEROSA AND ATTACHED SOFT TISSUE: NON-INVASIVE IMPLANTS OF SEROUS BORDERLINE TUMOR. 4. Omentum, resection for tumor - MICROSCOPIC NON-INVASIVE IMPLANT OF SEROUS BORDERLINE TUMOR. - FOCI OF INFLAMMATION AND HYPEREMIA. 5. Soft tissue mass, simple excision, sigmoid - SEROUS BORDERLINE TUMOR, 8 CM CONSISTENT WITH NON-INVASIVE IMPLANT.   At her postoperative evaluation she had staples removed and dehiscence of her wound. A wound vac was applied and managed as an outpatient. There was no cellulitis or infection.  The patient had application of a wound vac for approximately 3 months. The wound completely healed.   She had repeat imaging with CT chest on 02/23/16 and MR abdomen on 02/23/16. C CT of the chest showed stable supraclavicular and hilar lymphadenopathy consistent with a diagnosis of sarcoidosis and unlikely to be reflective of metastatic borderline tumor The MRI of the abdomen demonstrated stable porta hepatis nodes also consistent with likely sarcoidosis and not metastatic disease. However a new enhancing 1 cm nodule was identified in the right paracolic gutter that was slightly increased since the February 2017 MRI could not exclude recurrent tumor implant. There was no other evidence of peritoneal metastases or ascites.  Repeat MRI in August 2017 showed complete resolution of the right paracolic gutter implant. CA 125 on 05/26/16 was stable and normal at 23.  Interval History:   Stable weight. She is feeling well with no pain and normal bowel function.    Review of Systems  Constitutional: Feels well.  Denies fever, chills, early satiety, unintentional weight loss or gain.  Cardiovascular: No chest pain, shortness of breath, or edema.  Pulmonary: No cough or wheeze.  Gastrointestinal: No nausea, vomiting, or diarrhea. No bright red blood per rectum or change in bowel movement.  Genitourinary: No frequency, urgency, or dysuria. No vaginal bleeding or discharge.  Musculoskeletal: No myalgia or joint pain. Neurologic: No weakness, numbness, or change in gait.  Psychology: No depression, anxiety, or insomnia.  Current Meds:  Outpatient Encounter Prescriptions as of 08/09/2016  Medication Sig  . atenolol (TENORMIN) 25 MG tablet Take 25 mg by mouth daily.  Marland Kitchen atorvastatin (LIPITOR) 40 MG tablet Take 40 mg by mouth daily at 6 PM.   . busPIRone (BUSPAR) 15 MG tablet Take 15 mg by mouth 2 (two) times daily.  . citalopram (CELEXA) 40 MG tablet Take 40 mg by mouth daily.  Marland Kitchen lisinopril (PRINIVIL,ZESTRIL) 20 MG tablet Take 20 mg by mouth daily.  . metFORMIN (GLUCOPHAGE) 500 MG tablet Take 500 mg by mouth daily with breakfast.   . traZODone (DESYREL) 50 MG tablet Take 50 mg by mouth at bedtime.  Marland Kitchen albuterol (PROVENTIL HFA;VENTOLIN HFA) 108 (90 BASE) MCG/ACT inhaler Inhale 1-2 puffs into the lungs every 6 (six) hours as needed for wheezing or shortness of breath. Reported on 02/23/2016  . budesonide-formoterol (SYMBICORT) 160-4.5 MCG/ACT inhaler Inhale 2 puffs into the lungs 2 (two) times daily. Reported on 02/23/2016  . Multiple Vitamin (MULTIVITAMIN WITH MINERALS) TABS tablet Take 1 tablet by mouth daily.   No facility-administered encounter medications on file  as of 08/09/2016.     Allergy:  Allergies  Allergen Reactions  . Hydrocodone Itching    Can only take w benadryl    Social Hx:   Social History   Social History  . Marital status: Married    Spouse name: N/A  . Number of children: N/A  . Years of education: N/A   Occupational  History  . Not on file.   Social History Main Topics  . Smoking status: Never Smoker  . Smokeless tobacco: Never Used  . Alcohol use No  . Drug use: No  . Sexual activity: Not on file   Other Topics Concern  . Not on file   Social History Narrative  . No narrative on file    Past Surgical Hx:  Past Surgical History:  Procedure Laterality Date  . ABDOMINAL HYSTERECTOMY Bilateral 09/04/2015   Procedure: HYSTERECTOMY TOTAL ABDOMINAL, EXPLORATORY LAPAROTOMY, BILATERAL SALPINGO OOPHERECTOMY, OMENTECTOMY, DEBULKING;  Surgeon: [name], MD;  Location: [facility];  Service: Gynecology;  Laterality: Bilateral;  . CARDIAC CATHETERIZATION     2 years ago  . TONSILLECTOMY    . TUBAL LIGATION      Past Medical Hx:  Past Medical History:  Diagnosis Date  . Adnexal mass    BILATERAL  . Anxiety   . Arthritis   . Asthma   . Constipation   . Depression   . Diabetes mellitus without complication ([place])   . Family history of adverse reaction to anesthesia    FAMILY MEMBERS ARE SLOW TO WAKE UP  . Hypertension   . Numbness and tingling    FEET /LEGS  . ovarian ca dx'd 07/2015  . Recurrent UTI   . Sleep apnea    DOES NOT USE C -PAP    Family Hx:  Family History  Problem Relation Age of Onset  . Anesthesia problems Mother   . Diabetes Mother   . Hypertension Mother   . Cancer Father   . Diabetes Brother     Vitals:  Blood pressure (!) 153/83, pulse 90, temperature 98.2 F (36.8 C), temperature source Oral, resp. rate 18, weight 245 lb 4.8 oz (111.3 kg), SpO2 100 %.  Physical Exam:  General: Well developed, well nourished female in no acute distress. Alert and oriented x 3.  Cardiovascular: Regular rate and rhythm. S1 and S2 normal.  Lungs: Clear to auscultation bilaterally. No wheezes/crackles/rhonchi noted.  Skin: No rashes or lesions present. Back: No CVA tenderness.  Genitourinary: vaginal cuff well healed, no bleeding or discharge.No pelvic masses Rectal: no palpable  masses or lesions. Abdomen: Abdomen soft, non-tender and morbidly obese. Wound completely healed. Extremities: No bilateral cyanosis, edema, or clubbing.    Donaciano Eva, MD  08/09/2016, 2:48 PM

## 2016-08-10 ENCOUNTER — Telehealth: Payer: Self-pay | Admitting: Gynecologic Oncology

## 2016-08-10 ENCOUNTER — Telehealth: Payer: Self-pay

## 2016-08-10 LAB — CANCER ANTIGEN 125 (PARALLEL TESTING): CA 125: 29 U/mL (ref <35)

## 2016-08-10 LAB — CA 125: Cancer Antigen (CA) 125: 26.9 U/mL (ref 0.0–38.1)

## 2016-08-10 NOTE — Telephone Encounter (Signed)
Patient called wanting lab results.  Discussed CA 125, CBC, and CMET.  All questions answered.  Advised to call for any needs.

## 2016-08-10 NOTE — Telephone Encounter (Signed)
Orders received from [facility] to contact the patient to update with CA 125 Level being "normal" at  ( 29 ) . The patient was contacted and updated , patient states understanding , denies further questions at this time.

## 2016-09-28 ENCOUNTER — Other Ambulatory Visit: Payer: Self-pay | Admitting: Nurse Practitioner

## 2016-11-11 NOTE — Progress Notes (Signed)
Follow Up Note: Gyn-Onc  [name] 51 y.o. female  CC:  Chief Complaint  Patient presents with  . 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   1/ Stage IIIC serous LMP of ovary: No evidence of recurrence on August 2017 MRI. Will follow with CA 125's and annual MRI (next due August 2018). If CA 125 elevated or doubled, will order MRI now. 2/ depression: to continue treatment with her psychologist. 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 Will not continue to follow these sites with reimaging.  Follow-up in 3 months (May 2018) with exam, CA 125.  HPI:  Melissa Richards is a 52 year old G2P2 initially seen at the request of Dr Carlena Bjornstad for a large left ovarian mass and elevated CA 125 on November 1st, 2016.  She reported a 2 month history of increasing left sided abdominal discomfort and constipation and presented to the [facility] ER on 07/17/15 with symptoms of pelvic swelling, left lower back and abdominal pain.  A CT of the abdomen and pelvis on 07/17/15 showed bibasilar nodularity, a subcarinal 1.4cm node suggestive of infrahilar adenopathy, multiple small retroperitoneal nodes (none pathologic by size criteria) including a 78mm aortocaval node and a 1.5cm gastrohepatic ligament node. There is a portacaval node measuring 2.5 x 3.7cm (image/series 21/2) and a 1.2cm right external iliac node.  There is soft tissue fullness extending superiorly off the right ovary (measuring 6x10cm) and a dominant left ovarian cystic mass arising from the pelvis measuring 18 x 22.5cm.   A CA 125  was drawn on 07/23/15 and was elevated at 2782. (CEA was normal at 0.4).  Medical history significant for morbid obesity, OSA, asthma and non-cardiac shortness of breath. She underwent coronary catheterization 2 years ago for this, which was negative for blockage, and she did not require stenting, or antiplatelet therapy. She was referred to pulmonology who diagnosed her as having asthma. She is a diabetic.  A Chest Ct on 07/30/15 Revealed: 1. Bilateral small pulmonary nodules are concerning for pulmonary metastasis. 2. Mild mediastinal and bilateral hilar adenopathy is also concerning for metastatic disease. 3. Mildly enlarged LEFT supraclavicular lymph node is concerning for metastatic disease. This lymph node may be amenable to ultrasound-guided biopsy 4. Enlargement of the LEFT lobe of thyroid gland likely represents benign goiter.    Due to the findings of unresectable chest and upper abdominal disease, she was scheduled for neoadjuvant chemotherapy following histologic confirmation via biopsy.  Biopsy of the supraclavicular node on 08/05/15 revealed FOCAL CLUSTERS WITH GRANULOMAS, SEE COMMENT. LYMPHOID TISSUE PRESENT. NO MALIGNANT CELLS IDENTIFIED.  Paracentesis of the ascites and biopsy of the pelvic soft tissue mass on 08/18/15 revealed SEROUS TUMOR WITH FOCAL PROLIFERATION, with the ascites showing papillary fragments of serous tumor (at least LMP).  Due to these inconclusive findings for malignancy and a concern that this is a low malignant potential tumor which would not be responsive to chemotherapy, chemotherapy was cancelled and surgery was scheduled. If this disease is LMP, it is likely that the chest and upper abdominal adenopathy is non-malignant.  On 09/04/15, she underwent an exploratory laparotomy with TAH, bilateral salpingo-oophorectomy, omentectomy radical tumor debulking for ovarian cancer. [place]  modifier for extreme intraperitoneal obesity making surgical exposure more complex and  increasing duration of procedure by 1 hour by Dr. [name].  Final pathology revealed: 1. 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 - NON-INVASIVE IMPLANT ON FALLOPIAN TUBE SEROSA. 2. Adnexa - ovary +/- tube, neoplastic, right - SEROUS BORDERLINE TUMOR, 8 CM WITH OVARIAN SURFACE INVOLVEMENT. - NON-INVASIVE IMPLANT ON FALLOPIAN TUBE SEROSA. 3. Uterus and cervix - CERVIX: NABOTHIAN CYST. - ENDOMETRIUM: INACTIVE. NO EVIDENCE OF HYPERPLASIA OR CARCINOMA. - MYOMETRIUM: LEIOMYOMA. - UTERINE SEROSA AND ATTACHED SOFT TISSUE: NON-INVASIVE IMPLANTS OF SEROUS BORDERLINE TUMOR. 4. Omentum, resection for tumor - MICROSCOPIC NON-INVASIVE IMPLANT OF SEROUS BORDERLINE TUMOR. - FOCI OF INFLAMMATION AND HYPEREMIA. 5. Soft tissue mass, simple excision, sigmoid - SEROUS BORDERLINE TUMOR, 8 CM CONSISTENT WITH NON-INVASIVE IMPLANT.   At her postoperative evaluation she had staples removed and dehiscence of her wound. A wound vac was applied and managed as an outpatient. There was no cellulitis or infection.  The patient had application of a wound vac for approximately 3 months. The wound completely healed.   She had repeat imaging with CT chest on 02/23/16 and MR abdomen on 02/23/16. C CT of the chest showed stable supraclavicular and hilar lymphadenopathy consistent with a diagnosis of sarcoidosis and unlikely to be reflective of metastatic borderline tumor The MRI of the abdomen demonstrated stable porta hepatis nodes also consistent with likely sarcoidosis and not metastatic disease. However a new enhancing 1 cm nodule was identified in the right paracolic gutter that was slightly increased since the February 2017 MRI could not exclude recurrent tumor implant. There was no other evidence of peritoneal metastases or ascites.  Repeat MRI in August 2017 showed complete resolution of the right paracolic gutter implant. CA 125 on 05/26/16 was stable and  normal at 23.  Interval History:   CA 125 on 08/09/16 was stable at 29.  Stable weight. She is feeling very occasional (total of 3 in 3 months) episodes of right mid abdominal cramping pain that feel like pulled muscles coming in waves ("like labor pains"). "they come and go".  She is very depressed currently - sad, fatigued, weepy. Seeing her psychologist tomorrow. Very anxious about the potential for recurrence.  Review of Systems  Constitutional: Feels physically well  Denies fever, chills, early satiety, unintentional weight loss or gain.  Cardiovascular: No chest pain, shortness of breath, or edema.  Pulmonary: No cough or wheeze.  Gastrointestinal: No nausea, vomiting, or diarrhea. No bright red blood per rectum or change in bowel movement. + occasional abdominal pains (on right) Genitourinary: No frequency, urgency, or dysuria. No vaginal bleeding or discharge.  Musculoskeletal: No myalgia or joint pain. Neurologic: No weakness, numbness, or change in gait.  Psychology: + depression, feels sad and weepy  Current Meds:  Outpatient Encounter Prescriptions as of 11/12/2016  Medication Sig  . albuterol (PROVENTIL HFA;VENTOLIN HFA) 108 (90 BASE) MCG/ACT inhaler Inhale 1-2 puffs into the lungs every 6 (six) hours as needed for wheezing or shortness of breath. Reported on 02/23/2016  . atenolol (TENORMIN) 25 MG tablet Take 25 mg by mouth daily.  Marland Kitchen atorvastatin (LIPITOR) 40 MG tablet Take 40 mg by mouth daily at 6 PM.   . budesonide-formoterol (SYMBICORT) 160-4.5 MCG/ACT inhaler Inhale 2 puffs into the lungs 2 (two) times daily. Reported on 02/23/2016  . busPIRone (BUSPAR) 15 MG tablet Take 15 mg by mouth 2 (two) times daily.  . citalopram (CELEXA) 40 MG  tablet Take 40 mg by mouth daily.  Marland Kitchen lisinopril (PRINIVIL,ZESTRIL) 20 MG tablet Take 20 mg by mouth daily.  . metFORMIN (GLUCOPHAGE) 500 MG tablet Take 500 mg by mouth daily with breakfast.   . Multiple Vitamin (MULTIVITAMIN WITH MINERALS)  TABS tablet Take 1 tablet by mouth daily.  . traZODone (DESYREL) 50 MG tablet Take 50 mg by mouth at bedtime.   No facility-administered encounter medications on file as of 11/12/2016.     Allergy:  Allergies  Allergen Reactions  . Hydrocodone Itching    Can only take w benadryl    Social Hx:   Social History   Social History  . Marital status: Married    Spouse name: N/A  . Number of children: N/A  . Years of education: N/A   Occupational History  . Not on file.   Social History Main Topics  . Smoking status: Never Smoker  . Smokeless tobacco: Never Used  . Alcohol use No  . Drug use: No  . Sexual activity: Not on file   Other Topics Concern  . Not on file   Social History Narrative  . No narrative on file    Past Surgical Hx:  Past Surgical History:  Procedure Laterality Date  . ABDOMINAL HYSTERECTOMY Bilateral 09/04/2015   Procedure: HYSTERECTOMY TOTAL ABDOMINAL, EXPLORATORY LAPAROTOMY, BILATERAL SALPINGO OOPHERECTOMY, OMENTECTOMY, DEBULKING;  Surgeon: [name], MD;  Location: [facility];  Service: Gynecology;  Laterality: Bilateral;  . CARDIAC CATHETERIZATION     2 years ago  . TONSILLECTOMY    . TUBAL LIGATION      Past Medical Hx:  Past Medical History:  Diagnosis Date  . Adnexal mass    BILATERAL  . Anxiety   . Arthritis   . Asthma   . Constipation   . Depression   . Diabetes mellitus without complication ([place])   . Family history of adverse reaction to anesthesia    FAMILY MEMBERS ARE SLOW TO WAKE UP  . Hypertension   . Numbness and tingling    FEET /LEGS  . ovarian ca dx'd 07/2015  . Recurrent UTI   . Sleep apnea    DOES NOT USE C -PAP    Family Hx:  Family History  Problem Relation Age of Onset  . Anesthesia problems Mother   . Diabetes Mother   . Hypertension Mother   . Cancer Father   . Diabetes Brother     Vitals:  Blood pressure 139/88, pulse 86, temperature 98 F (36.7 C), temperature source Oral, resp. rate 18, height 5\' 4"   (1.626 m), weight 256 lb 12.8 oz (116.5 kg), SpO2 100 %.  Physical Exam:  General: Well developed, well nourished female in no acute distress. Alert and oriented x 3.  Cardiovascular: Regular rate and rhythm. S1 and S2 normal.  Lungs: Clear to auscultation bilaterally. No wheezes/crackles/rhonchi noted.  Skin: No rashes or lesions present. Back: No CVA tenderness.  Genitourinary: vaginal cuff well healed, no bleeding or discharge.No pelvic masses Rectal: no palpable masses or lesions. Abdomen: Abdomen soft, non-tender and morbidly obese. Wound completely healed. Extremities: No bilateral cyanosis, edema, or clubbing.    Donaciano Eva, MD  11/12/2016, 2:43 PM

## 2016-11-12 ENCOUNTER — Encounter: Payer: Self-pay | Admitting: Gynecologic Oncology

## 2016-11-12 ENCOUNTER — Ambulatory Visit (HOSPITAL_BASED_OUTPATIENT_CLINIC_OR_DEPARTMENT_OTHER): Payer: BLUE CROSS/BLUE SHIELD

## 2016-11-12 ENCOUNTER — Ambulatory Visit: Payer: BLUE CROSS/BLUE SHIELD | Attending: Gynecologic Oncology | Admitting: Gynecologic Oncology

## 2016-11-12 VITALS — BP 139/88 | HR 86 | Temp 98.0°F | Resp 18 | Ht 64.0 in | Wt 256.8 lb

## 2016-11-12 DIAGNOSIS — Z9079 Acquired absence of other genital organ(s): Secondary | ICD-10-CM | POA: Insufficient documentation

## 2016-11-12 DIAGNOSIS — D259 Leiomyoma of uterus, unspecified: Secondary | ICD-10-CM | POA: Insufficient documentation

## 2016-11-12 DIAGNOSIS — K5900 Constipation, unspecified: Secondary | ICD-10-CM | POA: Insufficient documentation

## 2016-11-12 DIAGNOSIS — I10 Essential (primary) hypertension: Secondary | ICD-10-CM | POA: Insufficient documentation

## 2016-11-12 DIAGNOSIS — E119 Type 2 diabetes mellitus without complications: Secondary | ICD-10-CM | POA: Insufficient documentation

## 2016-11-12 DIAGNOSIS — J45909 Unspecified asthma, uncomplicated: Secondary | ICD-10-CM | POA: Insufficient documentation

## 2016-11-12 DIAGNOSIS — Z87440 Personal history of urinary (tract) infections: Secondary | ICD-10-CM | POA: Insufficient documentation

## 2016-11-12 DIAGNOSIS — R591 Generalized enlarged lymph nodes: Secondary | ICD-10-CM | POA: Diagnosis not present

## 2016-11-12 DIAGNOSIS — R590 Localized enlarged lymph nodes: Secondary | ICD-10-CM | POA: Diagnosis not present

## 2016-11-12 DIAGNOSIS — G4733 Obstructive sleep apnea (adult) (pediatric): Secondary | ICD-10-CM | POA: Diagnosis not present

## 2016-11-12 DIAGNOSIS — R918 Other nonspecific abnormal finding of lung field: Secondary | ICD-10-CM | POA: Insufficient documentation

## 2016-11-12 DIAGNOSIS — Z7984 Long term (current) use of oral hypoglycemic drugs: Secondary | ICD-10-CM | POA: Insufficient documentation

## 2016-11-12 DIAGNOSIS — Z01818 Encounter for other preprocedural examination: Secondary | ICD-10-CM | POA: Insufficient documentation

## 2016-11-12 DIAGNOSIS — F419 Anxiety disorder, unspecified: Secondary | ICD-10-CM | POA: Insufficient documentation

## 2016-11-12 DIAGNOSIS — F329 Major depressive disorder, single episode, unspecified: Secondary | ICD-10-CM | POA: Diagnosis not present

## 2016-11-12 DIAGNOSIS — Z90722 Acquired absence of ovaries, bilateral: Secondary | ICD-10-CM | POA: Diagnosis not present

## 2016-11-12 DIAGNOSIS — D3910 Neoplasm of uncertain behavior of unspecified ovary: Secondary | ICD-10-CM | POA: Diagnosis not present

## 2016-11-12 NOTE — Patient Instructions (Addendum)
We will call you with your CA 125 results Follow up in 3 months.

## 2016-11-13 LAB — CA 125: CANCER ANTIGEN (CA) 125: 18.9 U/mL (ref 0.0–38.1)

## 2016-11-15 ENCOUNTER — Telehealth: Payer: Self-pay | Admitting: Gynecologic Oncology

## 2016-11-15 NOTE — Telephone Encounter (Signed)
Called to inform patient Melissa Richards of a normal CA 125 result. Message left that "blood test was normal. No further testing required at this time. This is very reassuring. I will see you again in 3 months time as scheduled. Please call my office with questions or further details."

## 2017-02-02 DIAGNOSIS — Z1231 Encounter for screening mammogram for malignant neoplasm of breast: Secondary | ICD-10-CM | POA: Diagnosis not present

## 2017-02-09 ENCOUNTER — Ambulatory Visit: Payer: BLUE CROSS/BLUE SHIELD | Attending: Gynecologic Oncology | Admitting: Gynecologic Oncology

## 2017-02-09 ENCOUNTER — Ambulatory Visit (HOSPITAL_BASED_OUTPATIENT_CLINIC_OR_DEPARTMENT_OTHER): Payer: BLUE CROSS/BLUE SHIELD

## 2017-02-09 ENCOUNTER — Encounter: Payer: Self-pay | Admitting: Gynecologic Oncology

## 2017-02-09 VITALS — BP 123/82 | HR 74 | Temp 98.1°F | Resp 20 | Wt 262.2 lb

## 2017-02-09 DIAGNOSIS — Z8249 Family history of ischemic heart disease and other diseases of the circulatory system: Secondary | ICD-10-CM | POA: Insufficient documentation

## 2017-02-09 DIAGNOSIS — Z7984 Long term (current) use of oral hypoglycemic drugs: Secondary | ICD-10-CM | POA: Insufficient documentation

## 2017-02-09 DIAGNOSIS — Z8742 Personal history of other diseases of the female genital tract: Secondary | ICD-10-CM | POA: Insufficient documentation

## 2017-02-09 DIAGNOSIS — Z90722 Acquired absence of ovaries, bilateral: Secondary | ICD-10-CM | POA: Insufficient documentation

## 2017-02-09 DIAGNOSIS — Z8603 Personal history of neoplasm of uncertain behavior: Secondary | ICD-10-CM | POA: Diagnosis not present

## 2017-02-09 DIAGNOSIS — D3910 Neoplasm of uncertain behavior of unspecified ovary: Secondary | ICD-10-CM | POA: Diagnosis not present

## 2017-02-09 DIAGNOSIS — E119 Type 2 diabetes mellitus without complications: Secondary | ICD-10-CM | POA: Insufficient documentation

## 2017-02-09 DIAGNOSIS — Z833 Family history of diabetes mellitus: Secondary | ICD-10-CM | POA: Insufficient documentation

## 2017-02-09 DIAGNOSIS — Z885 Allergy status to narcotic agent status: Secondary | ICD-10-CM | POA: Diagnosis not present

## 2017-02-09 DIAGNOSIS — I10 Essential (primary) hypertension: Secondary | ICD-10-CM | POA: Diagnosis not present

## 2017-02-09 DIAGNOSIS — Z6841 Body Mass Index (BMI) 40.0 and over, adult: Secondary | ICD-10-CM | POA: Diagnosis not present

## 2017-02-09 DIAGNOSIS — Z90710 Acquired absence of both cervix and uterus: Secondary | ICD-10-CM | POA: Diagnosis not present

## 2017-02-09 DIAGNOSIS — Z79899 Other long term (current) drug therapy: Secondary | ICD-10-CM | POA: Insufficient documentation

## 2017-02-09 DIAGNOSIS — G4730 Sleep apnea, unspecified: Secondary | ICD-10-CM | POA: Diagnosis not present

## 2017-02-09 DIAGNOSIS — R591 Generalized enlarged lymph nodes: Secondary | ICD-10-CM | POA: Diagnosis not present

## 2017-02-09 DIAGNOSIS — J45909 Unspecified asthma, uncomplicated: Secondary | ICD-10-CM | POA: Insufficient documentation

## 2017-02-09 DIAGNOSIS — Z7951 Long term (current) use of inhaled steroids: Secondary | ICD-10-CM | POA: Insufficient documentation

## 2017-02-09 DIAGNOSIS — F329 Major depressive disorder, single episode, unspecified: Secondary | ICD-10-CM

## 2017-02-09 DIAGNOSIS — F419 Anxiety disorder, unspecified: Secondary | ICD-10-CM | POA: Insufficient documentation

## 2017-02-09 DIAGNOSIS — Z09 Encounter for follow-up examination after completed treatment for conditions other than malignant neoplasm: Secondary | ICD-10-CM | POA: Insufficient documentation

## 2017-02-09 DIAGNOSIS — C569 Malignant neoplasm of unspecified ovary: Secondary | ICD-10-CM

## 2017-02-09 NOTE — Patient Instructions (Signed)
Plan to have your MRI of the abdomen on May 18, 2017 at 9 am at [facility].  Nothing to eat or drink 4 hours before your test.  Arrive at 8:45am to check in.  Please call our office for any questions or concerns.

## 2017-02-09 NOTE — Progress Notes (Signed)
Follow Up Note: Gyn-Onc  Melissa Richards 51 y.o. female  CC:  Chief Complaint  Patient presents with  . 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   1/ Stage IIIC serous LMP of ovary: No evidence of recurrence on August 2017 MRI. Will follow with CA 125's and annual MRI (next due August 2018). If CA 125 elevated or doubled, will order MRI now. 2/ depression: to continue treatment with her psychologist. 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 Will not continue to follow these sites with reimaging.  Follow-up in 3 months (August 2018) with exam, MRI and CA 125.  HPI:  Melissa Richards is a 51 year old G2P2 initially seen at the request of Dr Carlena Bjornstad for a large left ovarian mass and elevated CA 125 on November 1st, 2016.  She reported a 2 month history of increasing left sided abdominal discomfort and constipation and presented to the [facility] ER on 07/17/15 with symptoms of pelvic swelling, left lower back and abdominal pain.  A CT of the abdomen and pelvis on 07/17/15 showed bibasilar nodularity, a subcarinal 1.4cm node suggestive of infrahilar adenopathy, multiple small retroperitoneal nodes (none pathologic by size criteria) including a 47mm aortocaval node and a 1.5cm gastrohepatic ligament node. There is a portacaval node measuring 2.5 x 3.7cm (image/series 21/2) and a 1.2cm right external iliac node.  There is soft tissue fullness extending superiorly off the right ovary (measuring 6x10cm) and a dominant left ovarian cystic mass arising from the pelvis measuring 18 x 22.5cm.    A CA 125 was drawn on 07/23/15 and was elevated at 2782. (CEA was normal at 0.4).  Medical history significant for morbid obesity, OSA, asthma and non-cardiac shortness of breath. She underwent coronary catheterization 2 years ago for this, which was negative for blockage, and she did not require stenting, or antiplatelet therapy. She was referred to pulmonology who diagnosed her as having asthma. She is a diabetic.  A Chest Ct on 07/30/15 Revealed: 1. Bilateral small pulmonary nodules are concerning for pulmonary metastasis. 2. Mild mediastinal and bilateral hilar adenopathy is also concerning for metastatic disease. 3. Mildly enlarged LEFT supraclavicular lymph node is concerning for metastatic disease. This lymph node may be amenable to ultrasound-guided biopsy 4. Enlargement of the LEFT lobe of thyroid gland likely represents benign goiter.    Due to the findings of unresectable chest and upper abdominal disease, she was scheduled for neoadjuvant chemotherapy following histologic confirmation via biopsy.  Biopsy of the supraclavicular node on 08/05/15 revealed FOCAL CLUSTERS WITH GRANULOMAS, SEE COMMENT. LYMPHOID TISSUE PRESENT. NO MALIGNANT CELLS IDENTIFIED.  Paracentesis of the ascites and biopsy of the pelvic soft tissue mass on 08/18/15 revealed SEROUS TUMOR WITH FOCAL PROLIFERATION, with the ascites showing papillary fragments of serous tumor (at least LMP).  Due to these inconclusive findings for malignancy and a concern that this is a low malignant potential tumor which would not be responsive to chemotherapy, chemotherapy was cancelled and surgery was scheduled. If this disease is LMP, it is likely that the chest and upper abdominal adenopathy is non-malignant.  On 09/04/15, she underwent an exploratory laparotomy with TAH, bilateral salpingo-oophorectomy, omentectomy radical tumor debulking for ovarian  cancer. 22 modifier for extreme intraperitoneal obesity making surgical exposure more complex  and increasing duration of procedure by 1 hour by Dr. [name].  Final pathology revealed: 1. 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 - NON-INVASIVE IMPLANT ON FALLOPIAN TUBE SEROSA. 2. Adnexa - ovary +/- tube, neoplastic, right - SEROUS BORDERLINE TUMOR, 8 CM WITH OVARIAN SURFACE INVOLVEMENT. - NON-INVASIVE IMPLANT ON FALLOPIAN TUBE SEROSA. 3. Uterus and cervix - CERVIX: NABOTHIAN CYST. - ENDOMETRIUM: INACTIVE. NO EVIDENCE OF HYPERPLASIA OR CARCINOMA. - MYOMETRIUM: LEIOMYOMA. - UTERINE SEROSA AND ATTACHED SOFT TISSUE: NON-INVASIVE IMPLANTS OF SEROUS BORDERLINE TUMOR. 4. Omentum, resection for tumor - MICROSCOPIC NON-INVASIVE IMPLANT OF SEROUS BORDERLINE TUMOR. - FOCI OF INFLAMMATION AND HYPEREMIA. 5. Soft tissue mass, simple excision, sigmoid - SEROUS BORDERLINE TUMOR, 8 CM CONSISTENT WITH NON-INVASIVE IMPLANT.   At her postoperative evaluation she had staples removed and dehiscence of her wound. A wound vac was applied and managed as an outpatient. There was no cellulitis or infection.  The patient had application of a wound vac for approximately 3 months. The wound completely healed.   She had repeat imaging with CT chest on 02/23/16 and MR abdomen on 02/23/16. C CT of the chest showed stable supraclavicular and hilar lymphadenopathy consistent with a diagnosis of sarcoidosis and unlikely to be reflective of metastatic borderline tumor The MRI of the abdomen demonstrated stable porta hepatis nodes also consistent with likely sarcoidosis and not metastatic disease. However a new enhancing 1 cm nodule was identified in the right paracolic gutter that was slightly increased since the February 2017 MRI could not exclude recurrent tumor implant. There was no other evidence of peritoneal metastases or ascites.  Repeat MRI in August 2017 showed complete resolution of the right paracolic gutter implant. CA 125 on 05/26/16 was stable  and normal at 23.  Interval History:   CA 125 on 08/09/16 was stable at 29.  Stable weight. She has persistent episodes of right mid abdominal cramping pain that feel like pulled muscles coming in waves ("like labor pains"). "they come and go".   Review of Systems  Constitutional: Feels physically well  Denies fever, chills, early satiety, unintentional weight loss or gain.  Cardiovascular: No chest pain, shortness of breath, or edema.  Pulmonary: No cough or wheeze.  Gastrointestinal: No nausea, vomiting, or diarrhea. No bright red blood per rectum or change in bowel movement. + occasional abdominal pains (on right) Genitourinary: No frequency, urgency, or dysuria. No vaginal bleeding or discharge.  Musculoskeletal: No myalgia or joint pain. Neurologic: No weakness, numbness, or change in gait.  Psychology: + depression, feels sad and weepy  Current Meds:  Outpatient Encounter Prescriptions as of 02/09/2017  Medication Sig  . albuterol (PROVENTIL HFA;VENTOLIN HFA) 108 (90 BASE) MCG/ACT inhaler Inhale 1-2 puffs into the lungs every 6 (six) hours as needed for wheezing or shortness of breath. Reported on 02/23/2016  . atenolol (TENORMIN) 25 MG tablet Take 25 mg by mouth daily.  Marland Kitchen atorvastatin (LIPITOR) 40 MG tablet Take 40 mg by mouth daily at 6 PM.   . budesonide-formoterol (SYMBICORT) 160-4.5 MCG/ACT inhaler Inhale 2 puffs into the lungs 2 (two) times daily. Reported on 02/23/2016  . busPIRone (BUSPAR) 15 MG tablet Take 15 mg by mouth 2 (two) times daily.  . citalopram (CELEXA) 40 MG tablet Take 40 mg by mouth daily.  Marland Kitchen lisinopril (PRINIVIL,ZESTRIL) 20 MG tablet Take 20 mg by mouth daily.  . metFORMIN (GLUCOPHAGE) 500 MG  tablet Take 500 mg by mouth daily with breakfast.   . Multiple Vitamin (MULTIVITAMIN WITH MINERALS) TABS tablet Take 1 tablet by mouth daily.  . traZODone (DESYREL) 50 MG tablet Take 50 mg by mouth at bedtime.   No facility-administered encounter medications on file as of  02/09/2017.     Allergy:  Allergies  Allergen Reactions  . Hydrocodone Itching    Can only take w benadryl    Social Hx:   Social History   Social History  . Marital status: Married    Spouse name: N/A  . Number of children: N/A  . Years of education: N/A   Occupational History  . Not on file.   Social History Main Topics  . Smoking status: Never Smoker  . Smokeless tobacco: Never Used  . Alcohol use No  . Drug use: No  . Sexual activity: Not on file   Other Topics Concern  . Not on file   Social History Narrative  . No narrative on file    Past Surgical Hx:  Past Surgical History:  Procedure Laterality Date  . ABDOMINAL HYSTERECTOMY Bilateral 09/04/2015   Procedure: HYSTERECTOMY TOTAL ABDOMINAL, EXPLORATORY LAPAROTOMY, BILATERAL SALPINGO OOPHERECTOMY, OMENTECTOMY, DEBULKING;  Surgeon: [name], MD;  Location: [facility];  Service: Gynecology;  Laterality: Bilateral;  . CARDIAC CATHETERIZATION     2 years ago  . TONSILLECTOMY    . TUBAL LIGATION      Past Medical Hx:  Past Medical History:  Diagnosis Date  . Adnexal mass    BILATERAL  . Anxiety   . Arthritis   . Asthma   . Constipation   . Depression   . Diabetes mellitus without complication ([place])   . Family history of adverse reaction to anesthesia    FAMILY MEMBERS ARE SLOW TO WAKE UP  . Hypertension   . Numbness and tingling    FEET /LEGS  . ovarian ca dx'd 07/2015  . Recurrent UTI   . Sleep apnea    DOES NOT USE C -PAP    Family Hx:  Family History  Problem Relation Age of Onset  . Anesthesia problems Mother   . Diabetes Mother   . Hypertension Mother   . Cancer Father   . Diabetes Brother     Vitals:  Blood pressure 123/82, pulse 74, temperature 98.1 F (36.7 C), temperature source Oral, resp. rate 20, weight 262 lb 3.2 oz (118.9 kg).  Physical Exam:  General: Well developed, well nourished female in no acute distress. Alert and oriented x 3.  Cardiovascular: Regular rate and  rhythm. S1 and S2 normal.  Lungs: Clear to auscultation bilaterally. No wheezes/crackles/rhonchi noted.  Skin: No rashes or lesions present. Back: No CVA tenderness.  Genitourinary: vaginal cuff well healed, no bleeding or discharge.No pelvic masses Rectal: no palpable masses or lesions. Abdomen: Abdomen soft, non-tender and morbidly obese. Wound completely healed. Extremities: No bilateral cyanosis, edema, or clubbing.    Donaciano Eva, MD  02/09/2017, 1:38 PM

## 2017-02-10 ENCOUNTER — Telehealth: Payer: Self-pay

## 2017-02-10 LAB — CA 125: Cancer Antigen (CA) 125: 20.6 U/mL (ref 0.0–38.1)

## 2017-02-10 NOTE — Telephone Encounter (Signed)
Told ms Overall the results of the CA-125 as noted below by [name].  Pt verbalized understanding.

## 2017-02-10 NOTE — Telephone Encounter (Signed)
-----   Message from [name], MD sent at 02/10/2017  9:31 AM EDT ----- Would you mind letting Melissa Richards know that her result is normal. I will see her again in 3 months as planned.

## 2017-05-02 DIAGNOSIS — F331 Major depressive disorder, recurrent, moderate: Secondary | ICD-10-CM | POA: Diagnosis not present

## 2017-05-17 ENCOUNTER — Other Ambulatory Visit: Payer: Self-pay | Admitting: *Deleted

## 2017-05-17 DIAGNOSIS — N838 Other noninflammatory disorders of ovary, fallopian tube and broad ligament: Secondary | ICD-10-CM

## 2017-05-17 DIAGNOSIS — D3910 Neoplasm of uncertain behavior of unspecified ovary: Secondary | ICD-10-CM

## 2017-05-18 ENCOUNTER — Other Ambulatory Visit (HOSPITAL_BASED_OUTPATIENT_CLINIC_OR_DEPARTMENT_OTHER): Payer: BLUE CROSS/BLUE SHIELD

## 2017-05-18 ENCOUNTER — Ambulatory Visit (HOSPITAL_BASED_OUTPATIENT_CLINIC_OR_DEPARTMENT_OTHER): Payer: BLUE CROSS/BLUE SHIELD | Admitting: Gynecologic Oncology

## 2017-05-18 ENCOUNTER — Encounter (HOSPITAL_COMMUNITY)
Admission: RE | Admit: 2017-05-18 | Discharge: 2017-05-18 | Disposition: A | Discharge status: Home / Self Care | Payer: BLUE CROSS/BLUE SHIELD | Source: Ambulatory Visit | Attending: Gynecologic Oncology | Admitting: Gynecologic Oncology

## 2017-05-18 VITALS — BP 138/76 | HR 86 | Temp 97.8°F | Resp 18 | Wt 269.0 lb

## 2017-05-18 DIAGNOSIS — E119 Type 2 diabetes mellitus without complications: Secondary | ICD-10-CM

## 2017-05-18 DIAGNOSIS — Z8543 Personal history of malignant neoplasm of ovary: Secondary | ICD-10-CM

## 2017-05-18 DIAGNOSIS — K760 Fatty (change of) liver, not elsewhere classified: Secondary | ICD-10-CM | POA: Diagnosis not present

## 2017-05-18 DIAGNOSIS — D3910 Neoplasm of uncertain behavior of unspecified ovary: Secondary | ICD-10-CM

## 2017-05-18 DIAGNOSIS — N839 Noninflammatory disorder of ovary, fallopian tube and broad ligament, unspecified: Secondary | ICD-10-CM | POA: Diagnosis not present

## 2017-05-18 DIAGNOSIS — Z08 Encounter for follow-up examination after completed treatment for malignant neoplasm: Secondary | ICD-10-CM | POA: Insufficient documentation

## 2017-05-18 DIAGNOSIS — J45909 Unspecified asthma, uncomplicated: Secondary | ICD-10-CM

## 2017-05-18 DIAGNOSIS — Z8603 Personal history of neoplasm of uncertain behavior: Secondary | ICD-10-CM | POA: Diagnosis not present

## 2017-05-18 DIAGNOSIS — F329 Major depressive disorder, single episode, unspecified: Secondary | ICD-10-CM

## 2017-05-18 DIAGNOSIS — R918 Other nonspecific abnormal finding of lung field: Secondary | ICD-10-CM | POA: Diagnosis not present

## 2017-05-18 DIAGNOSIS — Z87440 Personal history of urinary (tract) infections: Secondary | ICD-10-CM | POA: Insufficient documentation

## 2017-05-18 DIAGNOSIS — I10 Essential (primary) hypertension: Secondary | ICD-10-CM | POA: Insufficient documentation

## 2017-05-18 DIAGNOSIS — R591 Generalized enlarged lymph nodes: Secondary | ICD-10-CM | POA: Insufficient documentation

## 2017-05-18 DIAGNOSIS — N838 Other noninflammatory disorders of ovary, fallopian tube and broad ligament: Secondary | ICD-10-CM

## 2017-05-18 DIAGNOSIS — M545 Low back pain: Secondary | ICD-10-CM | POA: Insufficient documentation

## 2017-05-18 DIAGNOSIS — K5900 Constipation, unspecified: Secondary | ICD-10-CM

## 2017-05-18 DIAGNOSIS — R590 Localized enlarged lymph nodes: Secondary | ICD-10-CM | POA: Diagnosis not present

## 2017-05-18 DIAGNOSIS — F419 Anxiety disorder, unspecified: Secondary | ICD-10-CM

## 2017-05-18 LAB — COMPREHENSIVE METABOLIC PANEL
ALBUMIN: 3.7 g/dL (ref 3.5–5.0)
ALK PHOS: 111 U/L (ref 40–150)
ALT: 44 U/L (ref 0–55)
AST: 27 U/L (ref 5–34)
Anion Gap: 11 mEq/L (ref 3–11)
BILIRUBIN TOTAL: 0.52 mg/dL (ref 0.20–1.20)
BUN: 11.4 mg/dL (ref 7.0–26.0)
CO2: 26 meq/L (ref 22–29)
CREATININE: 0.9 mg/dL (ref 0.6–1.1)
Calcium: 9.7 mg/dL (ref 8.4–10.4)
Chloride: 101 mEq/L (ref 98–109)
EGFR: 72 mL/min/{1.73_m2} — AB (ref >90)
GLUCOSE: 205 mg/dL — AB (ref 70–140)
Potassium: 3.9 mEq/L (ref 3.5–5.1)
Sodium: 138 mEq/L (ref 136–145)
TOTAL PROTEIN: 6.7 g/dL (ref 6.4–8.3)

## 2017-05-18 LAB — CBC WITH DIFFERENTIAL/PLATELET
BASO%: 0.8 % (ref 0.0–2.0)
Basophils Absolute: 0.1 10*3/uL (ref 0.0–0.1)
EOS ABS: 0.3 10*3/uL (ref 0.0–0.5)
EOS%: 3.9 % (ref 0.0–7.0)
HCT: 42.4 % (ref 34.8–46.6)
HEMOGLOBIN: 14.2 g/dL (ref 11.6–15.9)
LYMPH%: 14.6 % (ref 14.0–49.7)
MCH: 29.2 pg (ref 25.1–34.0)
MCHC: 33.4 g/dL (ref 31.5–36.0)
MCV: 87.4 fL (ref 79.5–101.0)
MONO#: 0.8 10*3/uL (ref 0.1–0.9)
MONO%: 9.4 % (ref 0.0–14.0)
NEUT%: 71.3 % (ref 38.4–76.8)
NEUTROS ABS: 6.2 10*3/uL (ref 1.5–6.5)
Platelets: 219 10*3/uL (ref 145–400)
RBC: 4.85 10*6/uL (ref 3.70–5.45)
RDW: 13 % (ref 11.2–14.5)
WBC: 8.7 10*3/uL (ref 3.9–10.3)
lymph#: 1.3 10*3/uL (ref 0.9–3.3)

## 2017-05-18 LAB — POCT I-STAT CREATININE: CREATININE: 0.8 mg/dL (ref 0.44–1.00)

## 2017-05-18 MED ORDER — GADOBENATE DIMEGLUMINE 529 MG/ML IV SOLN
20.0000 mL | Freq: Once | INTRAVENOUS | Status: AC | PRN
  Administered 2017-05-18: 20 mL via INTRAVENOUS

## 2017-05-18 NOTE — Progress Notes (Signed)
Follow Up Note: Gyn-Onc  Melissa Richards 51 y.o. female  CC:  Chief Complaint  Patient presents with  . 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   1/ Stage IIIC serous LMP of ovary: No evidence of recurrence on August 2018 MRI. Will follow with CA 125's and annual MRI (next due August 2019). If CA 125 elevated or doubled, will order MRI now. 2/ depression: to continue treatment with her psychologist. 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 Will not continue to follow these sites with reimaging.  Follow-up in 3 months (August 2019) with exam, MRI and CA 125.  HPI:  Melissa Richards is a 51 year old G2P2 initially seen at the request of Dr Carlena Bjornstad for a large left ovarian mass and elevated CA 125 on November 1st, 2016.  She reported a 2 month history of increasing left sided abdominal discomfort and constipation and presented to the [facility] ER on 07/17/15 with symptoms of pelvic swelling, left lower back and abdominal pain.  A CT of the abdomen and pelvis on 07/17/15 showed bibasilar nodularity, a subcarinal 1.4cm node suggestive of infrahilar adenopathy, multiple small retroperitoneal nodes (none pathologic by size criteria) including a 44mm aortocaval node and a 1.5cm gastrohepatic ligament node. There is a portacaval node measuring 2.5 x 3.7cm (image/series 21/2) and a 1.2cm right external iliac node.  There is soft tissue fullness extending superiorly off the right ovary (measuring 6x10cm) and a dominant left ovarian cystic mass arising from the pelvis measuring 18 x 22.5cm.   A CA 125  was drawn on 07/23/15 and was elevated at 2782. (CEA was normal at 0.4).  Medical history significant for morbid obesity, OSA, asthma and non-cardiac shortness of breath. She underwent coronary catheterization 2 years ago for this, which was negative for blockage, and she did not require stenting, or antiplatelet therapy. She was referred to pulmonology who diagnosed her as having asthma. She is a diabetic.  A Chest Ct on 07/30/15 Revealed: 1. Bilateral small pulmonary nodules are concerning for pulmonary metastasis. 2. Mild mediastinal and bilateral hilar adenopathy is also concerning for metastatic disease. 3. Mildly enlarged LEFT supraclavicular lymph node is concerning for metastatic disease. This lymph node may be amenable to ultrasound-guided biopsy 4. Enlargement of the LEFT lobe of thyroid gland likely represents benign goiter.    Due to the findings of unresectable chest and upper abdominal disease, she was scheduled for neoadjuvant chemotherapy following histologic confirmation via biopsy.  Biopsy of the supraclavicular node on 08/05/15 revealed FOCAL CLUSTERS WITH GRANULOMAS, SEE COMMENT. LYMPHOID TISSUE PRESENT. NO MALIGNANT CELLS IDENTIFIED.  Paracentesis of the ascites and biopsy of the pelvic soft tissue mass on 08/18/15 revealed SEROUS TUMOR WITH FOCAL PROLIFERATION, with the ascites showing papillary fragments of serous tumor (at least LMP).  Due to these inconclusive findings for malignancy and a concern that this is a low malignant potential tumor which would not be responsive to chemotherapy, chemotherapy was cancelled and surgery was scheduled. If this disease is LMP, it is likely that the chest and upper abdominal adenopathy is non-malignant.  On 09/04/15, she underwent an exploratory laparotomy with TAH, bilateral salpingo-oophorectomy, omentectomy radical tumor debulking for ovarian cancer.  22 modifier for extreme intraperitoneal obesity making surgical exposure more complex and  increasing duration of procedure by 1 hour by Dr. [name].  Final pathology revealed: 1. 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 - NON-INVASIVE IMPLANT ON FALLOPIAN TUBE SEROSA. 2. Adnexa - ovary +/- tube, neoplastic, right - SEROUS BORDERLINE TUMOR, 8 CM WITH OVARIAN SURFACE INVOLVEMENT. - NON-INVASIVE IMPLANT ON FALLOPIAN TUBE SEROSA. 3. Uterus and cervix - CERVIX: NABOTHIAN CYST. - ENDOMETRIUM: INACTIVE. NO EVIDENCE OF HYPERPLASIA OR CARCINOMA. - MYOMETRIUM: LEIOMYOMA. - UTERINE SEROSA AND ATTACHED SOFT TISSUE: NON-INVASIVE IMPLANTS OF SEROUS BORDERLINE TUMOR. 4. Omentum, resection for tumor - MICROSCOPIC NON-INVASIVE IMPLANT OF SEROUS BORDERLINE TUMOR. - FOCI OF INFLAMMATION AND HYPEREMIA. 5. Soft tissue mass, simple excision, sigmoid - SEROUS BORDERLINE TUMOR, 8 CM CONSISTENT WITH NON-INVASIVE IMPLANT.   At her postoperative evaluation she had staples removed and dehiscence of her wound. A wound vac was applied and managed as an outpatient. There was no cellulitis or infection.  The patient had application of a wound vac for approximately 3 months. The wound completely healed.   She had repeat imaging with CT chest on 02/23/16 and MR abdomen on 02/23/16. C CT of the chest showed stable supraclavicular and hilar lymphadenopathy consistent with a diagnosis of sarcoidosis and unlikely to be reflective of metastatic borderline tumor The MRI of the abdomen demonstrated stable porta hepatis nodes also consistent with likely sarcoidosis and not metastatic disease. However a new enhancing 1 cm nodule was identified in the right paracolic gutter that was slightly increased since the February 2017 MRI could not exclude recurrent tumor implant. There was no other evidence of peritoneal metastases or ascites.  Repeat MRI in August 2017 showed complete resolution of the right paracolic gutter implant. CA 125 on 05/26/16 was stable and  normal at 23.  Interval History:   CA 125 on 08/09/16 was stable at 29.  Stable weight. She has persistent episodes of right mid abdominal cramping pain that feel like pulled muscles coming in waves ("like labor pains"). "they come and go".   CA 125 on 02/19/17 was stable at 20.6. MRI abdomen on 05/18/17 showed no metastatic disease, stable lymphadenopathy consistent with chronic inflammatory process.   Review of Systems  Constitutional: Feels physically well  Denies fever, chills, early satiety, unintentional weight loss or gain.  Cardiovascular: No chest pain, shortness of breath, or edema.  Pulmonary: No cough or wheeze.  Gastrointestinal: No nausea, vomiting, or diarrhea. No bright red blood per rectum or change in bowel movement. + occasional abdominal pains (on right) Genitourinary: No frequency, urgency, or dysuria. No vaginal bleeding or discharge.  Musculoskeletal: No myalgia or joint pain. Neurologic: No weakness, numbness, or change in gait.  Psychology: + depression, feels sad and weepy  Current Meds:  Outpatient Encounter Prescriptions as of 05/18/2017  Medication Sig  . albuterol (PROVENTIL HFA;VENTOLIN HFA) 108 (90 BASE) MCG/ACT inhaler Inhale 1-2 puffs into the lungs every 6 (six) hours as needed for wheezing or shortness of breath. Reported on 02/23/2016  . atenolol (TENORMIN) 25 MG tablet Take 25 mg by mouth daily.  Marland Kitchen atorvastatin (LIPITOR) 40 MG tablet Take 40 mg by mouth daily at 6 PM.   . budesonide-formoterol (SYMBICORT) 160-4.5 MCG/ACT inhaler Inhale 2 puffs into the lungs 2 (two) times daily. Reported on 02/23/2016  . busPIRone (BUSPAR) 15 MG tablet Take 15 mg by mouth 2 (two) times daily.  . citalopram (CELEXA) 40 MG tablet Take  40 mg by mouth daily.  Marland Kitchen lisinopril (PRINIVIL,ZESTRIL) 20 MG tablet Take 20 mg by mouth daily.  . metFORMIN (GLUCOPHAGE) 500 MG tablet Take 500 mg by mouth daily with breakfast.   . Multiple Vitamin (MULTIVITAMIN WITH MINERALS) TABS tablet  Take 1 tablet by mouth daily.  . traZODone (DESYREL) 50 MG tablet Take 50 mg by mouth at bedtime.   No facility-administered encounter medications on file as of 05/18/2017.     Allergy:  Allergies  Allergen Reactions  . Hydrocodone Itching    Can only take w benadryl    Social Hx:   Social History   Social History  . Marital status: Married    Spouse name: N/A  . Number of children: N/A  . Years of education: N/A   Occupational History  . Not on file.   Social History Main Topics  . Smoking status: Never Smoker  . Smokeless tobacco: Never Used  . Alcohol use No  . Drug use: No  . Sexual activity: Not on file   Other Topics Concern  . Not on file   Social History Narrative  . No narrative on file    Past Surgical Hx:  Past Surgical History:  Procedure Laterality Date  . ABDOMINAL HYSTERECTOMY Bilateral 09/04/2015   Procedure: HYSTERECTOMY TOTAL ABDOMINAL, EXPLORATORY LAPAROTOMY, BILATERAL SALPINGO OOPHERECTOMY, OMENTECTOMY, DEBULKING;  Surgeon: [name], MD;  Location: [facility];  Service: Gynecology;  Laterality: Bilateral;  . CARDIAC CATHETERIZATION     2 years ago  . TONSILLECTOMY    . TUBAL LIGATION      Past Medical Hx:  Past Medical History:  Diagnosis Date  . Adnexal mass    BILATERAL  . Anxiety   . Arthritis   . Asthma   . Constipation   . Depression   . Diabetes mellitus without complication ([place])   . Family history of adverse reaction to anesthesia    FAMILY MEMBERS ARE SLOW TO WAKE UP  . Hypertension   . Numbness and tingling    FEET /LEGS  . ovarian ca dx'd 07/2015  . Recurrent UTI   . Sleep apnea    DOES NOT USE C -PAP    Family Hx:  Family History  Problem Relation Age of Onset  . Anesthesia problems Mother   . Diabetes Mother   . Hypertension Mother   . Cancer Father   . Diabetes Brother     Vitals:  Blood pressure 138/76, pulse 86, temperature 97.8 F (36.6 C), temperature source Oral, resp. rate 18, weight 269 lb (122  kg), SpO2 97 %.  Physical Exam:  General: Well developed, well nourished female in no acute distress. Alert and oriented x 3.  Cardiovascular: Regular rate and rhythm. S1 and S2 normal.  Lungs: Clear to auscultation bilaterally. No wheezes/crackles/rhonchi noted.  Skin: No rashes or lesions present. Back: No CVA tenderness.  Genitourinary: vaginal cuff well healed, no bleeding or discharge.No pelvic masses Rectal: no palpable masses or lesions. Abdomen: Abdomen soft, non-tender and morbidly obese. Wound completely healed. Extremities: No bilateral cyanosis, edema, or clubbing.    Donaciano Eva, MD  05/18/2017, 1:25 PM

## 2017-05-19 LAB — CA 125: Cancer Antigen (CA) 125: 21.4 U/mL (ref 0.0–38.1)

## 2017-05-20 ENCOUNTER — Telehealth: Payer: Self-pay

## 2017-05-20 NOTE — Telephone Encounter (Signed)
Told Melissa Richards the result of the CA-125 from 05-18-17 as noted below by [name].

## 2017-05-20 NOTE — Telephone Encounter (Signed)
-----   Message from [name], MD sent at 05/19/2017  1:46 PM EDT ----- Would you mind letting Melissa Richards know that her CA 125 is low and normal.

## 2017-05-24 IMAGING — CT CT CHEST W/ CM
2 of 4 series · 15 of 36 positions shown, 18 images · IV contrast (OMNIPAQUE)
Comparison: CT abdomen pelvis 07/21/2015

CLINICAL DATA: Staging for newly diagnosed ovarian carcinoma.
Elevated CA 125 marker. Initial encounter

EXAM:
CT CHEST WITH CONTRAST
TECHNIQUE: Multidetector CT imaging of the chest was performed during
intravenous contrast administration.
CONTRAST:  75mL OMNIPAQUE IOHEXOL 300 MG/ML  SOLN

[Series 2: chest with st · axial · 0.94mm/px · z∈[+198,+448]mm · 12 of 60 slices shown, 15 images]
[im 5/60  mediastinal]
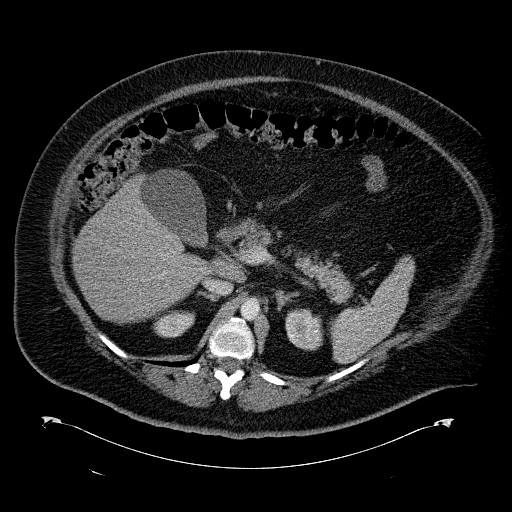
[im 5/60  lung]
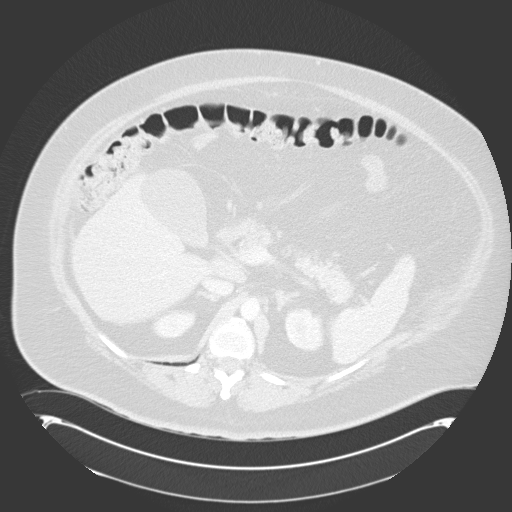
[im 10/60  lung]
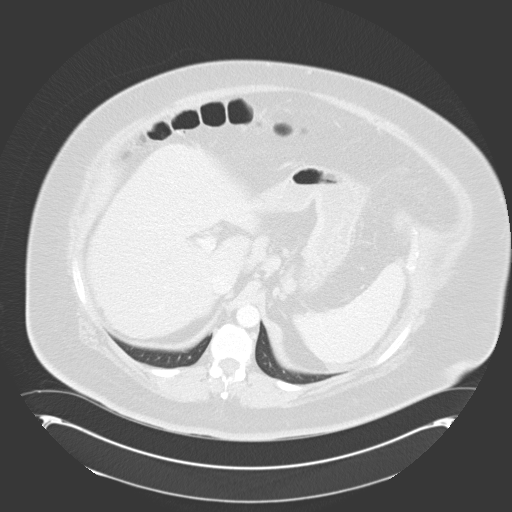
[im 14/60  lung]
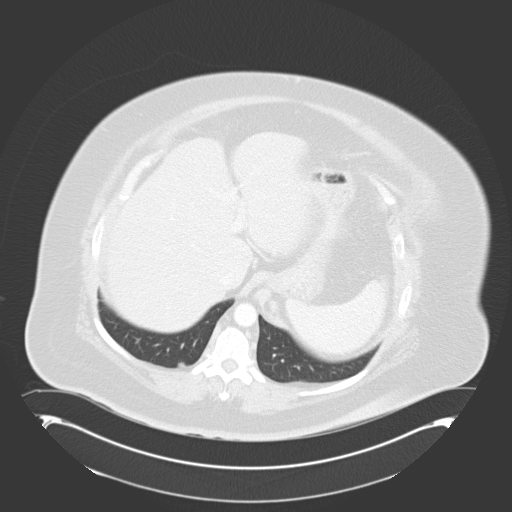
[im 19/60  lung]
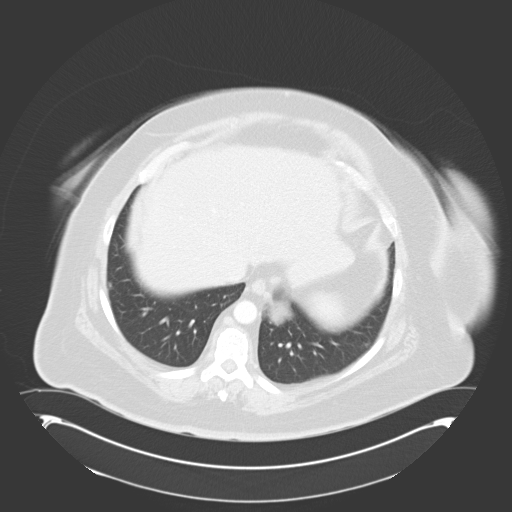
[im 23/60  mediastinal]
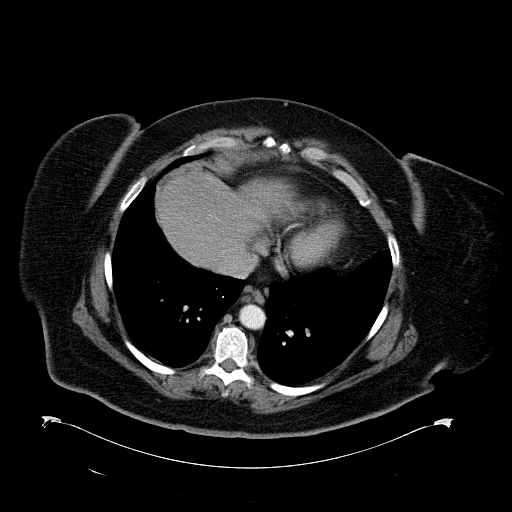
[im 23/60  lung]
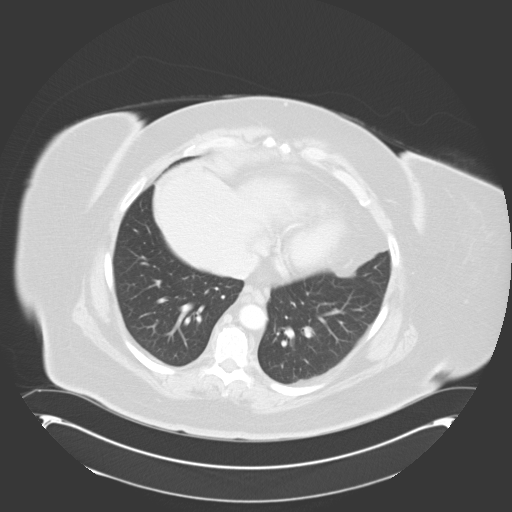
[im 28/60  lung]
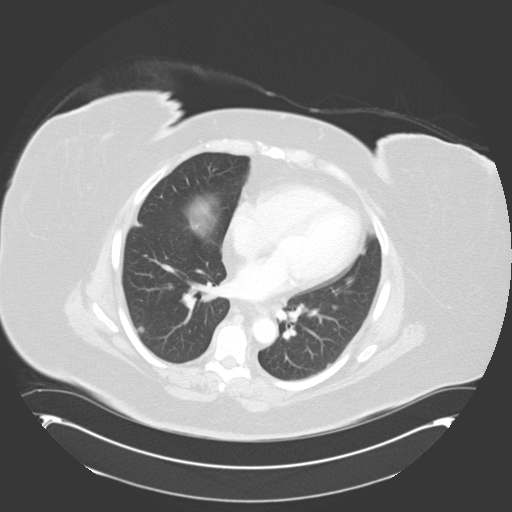
[im 32/60  lung]
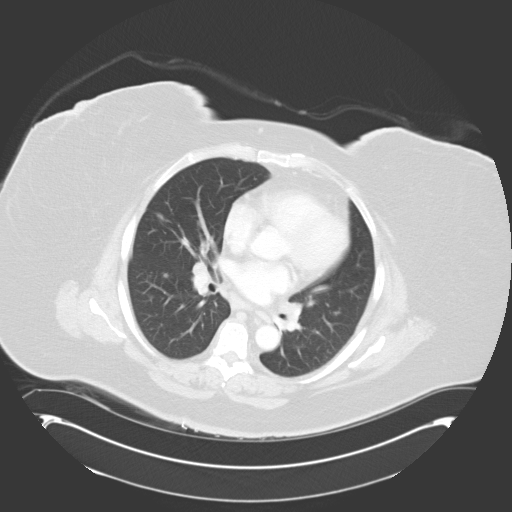
[im 37/60  lung]
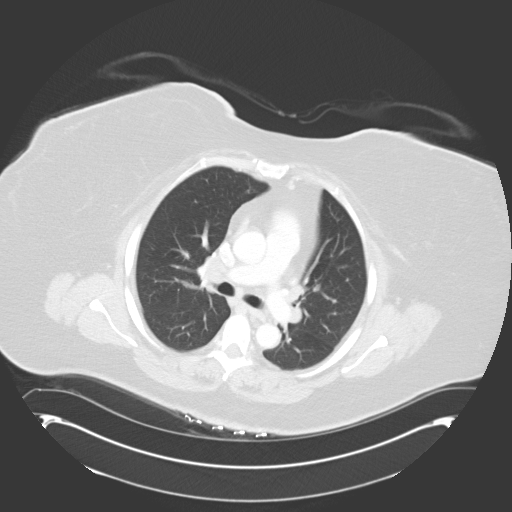
[im 41/60  mediastinal]
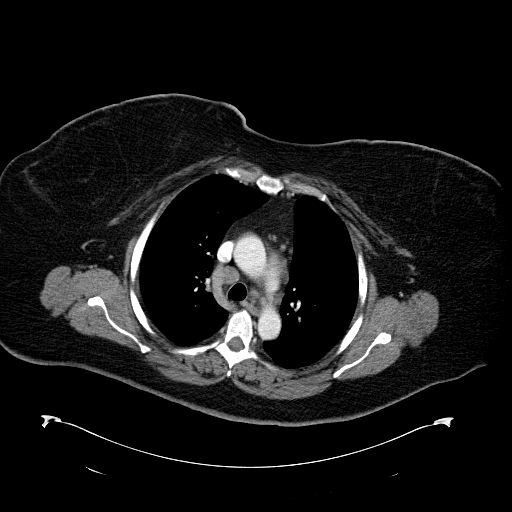
[im 41/60  lung]
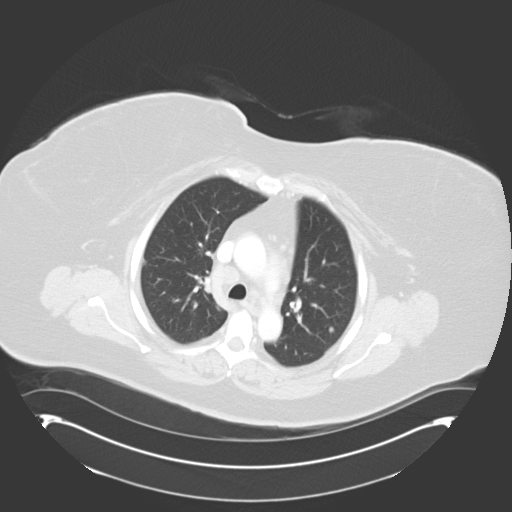
[im 46/60  lung]
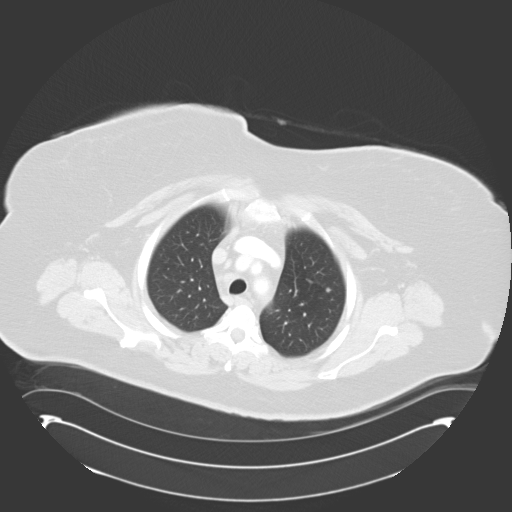
[im 50/60  lung]
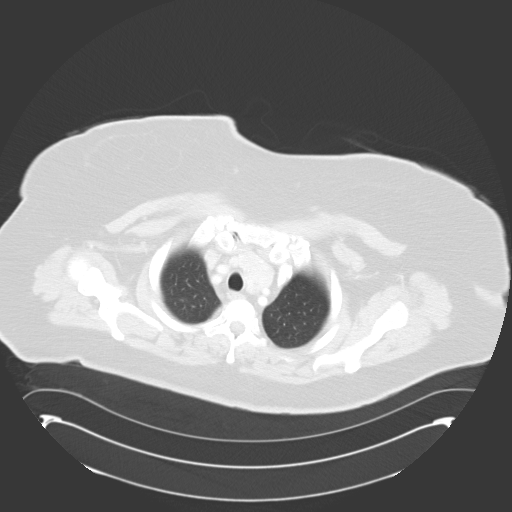
[im 55/60  lung]
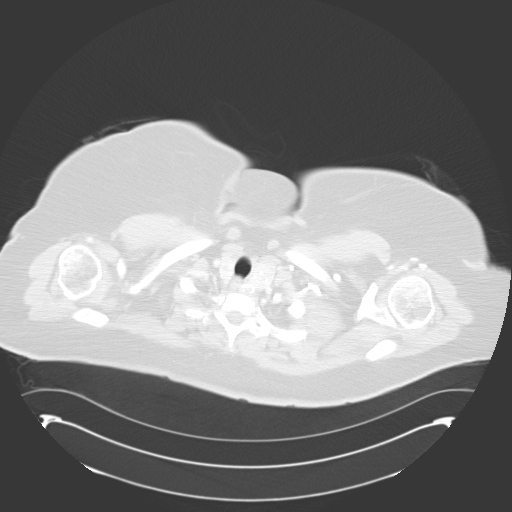

[Series 602: <mpr thick range> · coronal · 0.94mm/px · 3 of 110 slices shown]
[im 22/110  lung]
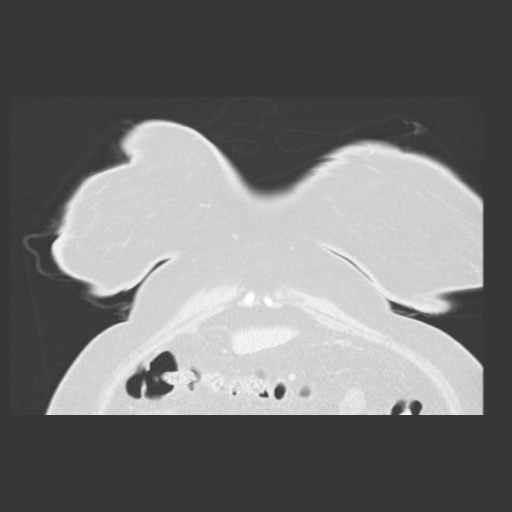
[im 44/110  lung]
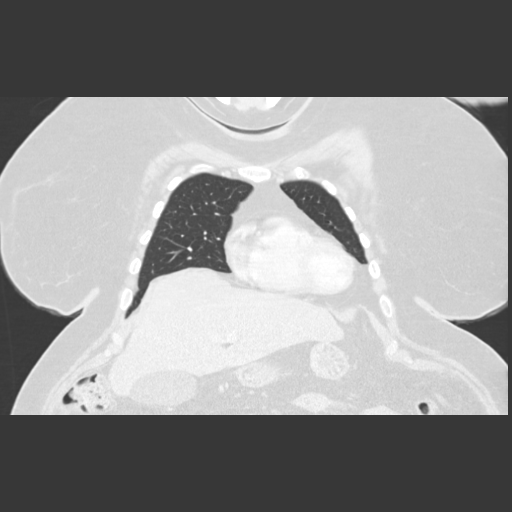
[im 66/110  lung]
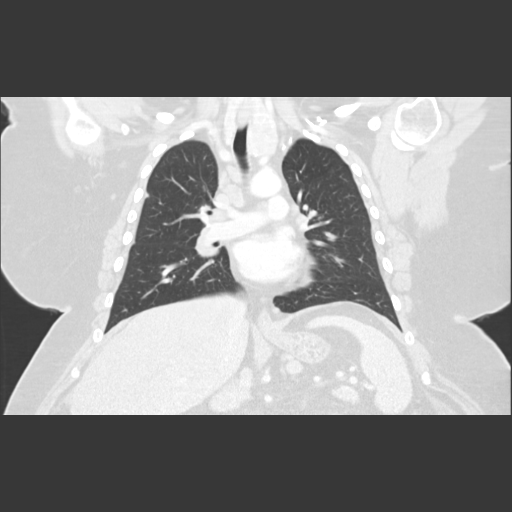

[15 of 36 positions shown; findings below may reference images not displayed]

FINDINGS: Mediastinum/Nodes: 9 mm LEFT supraclavicular lymph node (image 6,
series 2). This lymph node is best depicted on coronal image 63.
There is enlargement of the LEFT lobe of thyroid gland 231 x 35 mm
with coarse internal calcifications.

Enlarged RIGHT lower paratracheal lymph node measures 16 mm short
axis. Enlarged RIGHT hilar lymph node measures 17 mm. A prominent
LEFT hilar lymph nodes measure up to 9 mm. No central pulmonary
embolism.

Lungs/Pleura: Review of the lung parenchyma demonstrates bilateral
small pulmonary nodules with indistinct margins.

Example nodules

RIGHT lower lobe measures 5 mm on image 33, series 5.

RIGHT upper lobe nodule measures 4 mm image 17 series 5

RIGHT middle lobe nodule measures 5 mm image 21

Several nodules are dispersed along the horizontal oblique fissure
(image 28)

Within the LEFT lower lobe 5 mm nodule image 30

5 mm nodule within the lingula on image 26.

Upper abdomen: Limited view of the liver, kidneys, pancreas are
unremarkable. Normal adrenal glands.

Musculoskeletal: No aggressive osseous lesion.
IMPRESSION: 1. Bilateral small pulmonary nodules are concerning for pulmonary
metastasis.
2. Mild mediastinal and bilateral hilar adenopathy is also
concerning for metastatic disease.
3. Mildly enlarged LEFT supraclavicular lymph node is concerning for
metastatic disease. This lymph node may be amenable to
ultrasound-guided biopsy.
4. Enlargement of the LEFT lobe of thyroid gland likely represents
benign goiter.

## 2017-07-01 DIAGNOSIS — R0602 Shortness of breath: Secondary | ICD-10-CM | POA: Diagnosis not present

## 2017-07-01 DIAGNOSIS — N3090 Cystitis, unspecified without hematuria: Secondary | ICD-10-CM | POA: Diagnosis not present

## 2017-07-01 DIAGNOSIS — N3000 Acute cystitis without hematuria: Secondary | ICD-10-CM | POA: Diagnosis not present

## 2017-07-01 DIAGNOSIS — R05 Cough: Secondary | ICD-10-CM | POA: Diagnosis not present

## 2017-07-01 DIAGNOSIS — H6123 Impacted cerumen, bilateral: Secondary | ICD-10-CM | POA: Diagnosis not present

## 2017-07-26 DIAGNOSIS — E559 Vitamin D deficiency, unspecified: Secondary | ICD-10-CM | POA: Diagnosis not present

## 2017-07-26 DIAGNOSIS — I10 Essential (primary) hypertension: Secondary | ICD-10-CM | POA: Diagnosis not present

## 2017-07-26 DIAGNOSIS — E7801 Familial hypercholesterolemia: Secondary | ICD-10-CM | POA: Diagnosis not present

## 2017-07-26 DIAGNOSIS — E119 Type 2 diabetes mellitus without complications: Secondary | ICD-10-CM | POA: Diagnosis not present

## 2017-07-26 DIAGNOSIS — Z79899 Other long term (current) drug therapy: Secondary | ICD-10-CM | POA: Diagnosis not present

## 2017-08-09 DIAGNOSIS — I10 Essential (primary) hypertension: Secondary | ICD-10-CM | POA: Diagnosis not present

## 2017-08-09 DIAGNOSIS — E119 Type 2 diabetes mellitus without complications: Secondary | ICD-10-CM | POA: Diagnosis not present

## 2017-08-09 DIAGNOSIS — Z79899 Other long term (current) drug therapy: Secondary | ICD-10-CM | POA: Diagnosis not present

## 2017-08-09 DIAGNOSIS — E559 Vitamin D deficiency, unspecified: Secondary | ICD-10-CM | POA: Diagnosis not present

## 2017-09-07 DIAGNOSIS — F411 Generalized anxiety disorder: Secondary | ICD-10-CM | POA: Diagnosis not present

## 2017-09-07 DIAGNOSIS — F331 Major depressive disorder, recurrent, moderate: Secondary | ICD-10-CM | POA: Diagnosis not present

## 2017-09-19 ENCOUNTER — Ambulatory Visit: Payer: BLUE CROSS/BLUE SHIELD | Attending: Gynecologic Oncology | Admitting: Gynecologic Oncology

## 2017-09-19 ENCOUNTER — Encounter: Payer: Self-pay | Admitting: Gynecologic Oncology

## 2017-09-19 ENCOUNTER — Ambulatory Visit (HOSPITAL_BASED_OUTPATIENT_CLINIC_OR_DEPARTMENT_OTHER): Payer: BLUE CROSS/BLUE SHIELD

## 2017-09-19 VITALS — BP 140/82 | HR 87 | Temp 97.9°F | Resp 18 | Ht 69.0 in | Wt 260.0 lb

## 2017-09-19 DIAGNOSIS — Z8603 Personal history of neoplasm of uncertain behavior: Secondary | ICD-10-CM

## 2017-09-19 DIAGNOSIS — R590 Localized enlarged lymph nodes: Secondary | ICD-10-CM | POA: Diagnosis not present

## 2017-09-19 DIAGNOSIS — R591 Generalized enlarged lymph nodes: Secondary | ICD-10-CM | POA: Diagnosis not present

## 2017-09-19 DIAGNOSIS — F419 Anxiety disorder, unspecified: Secondary | ICD-10-CM | POA: Diagnosis not present

## 2017-09-19 DIAGNOSIS — D3910 Neoplasm of uncertain behavior of unspecified ovary: Secondary | ICD-10-CM

## 2017-09-19 DIAGNOSIS — J45909 Unspecified asthma, uncomplicated: Secondary | ICD-10-CM | POA: Diagnosis not present

## 2017-09-19 DIAGNOSIS — Z7984 Long term (current) use of oral hypoglycemic drugs: Secondary | ICD-10-CM | POA: Insufficient documentation

## 2017-09-19 DIAGNOSIS — I10 Essential (primary) hypertension: Secondary | ICD-10-CM | POA: Diagnosis not present

## 2017-09-19 DIAGNOSIS — E119 Type 2 diabetes mellitus without complications: Secondary | ICD-10-CM | POA: Insufficient documentation

## 2017-09-19 DIAGNOSIS — F329 Major depressive disorder, single episode, unspecified: Secondary | ICD-10-CM

## 2017-09-19 DIAGNOSIS — G4733 Obstructive sleep apnea (adult) (pediatric): Secondary | ICD-10-CM | POA: Diagnosis not present

## 2017-09-19 DIAGNOSIS — R918 Other nonspecific abnormal finding of lung field: Secondary | ICD-10-CM

## 2017-09-19 DIAGNOSIS — Z79899 Other long term (current) drug therapy: Secondary | ICD-10-CM | POA: Insufficient documentation

## 2017-09-19 DIAGNOSIS — K5900 Constipation, unspecified: Secondary | ICD-10-CM | POA: Insufficient documentation

## 2017-09-19 DIAGNOSIS — Z87440 Personal history of urinary (tract) infections: Secondary | ICD-10-CM | POA: Diagnosis not present

## 2017-09-19 DIAGNOSIS — Z90710 Acquired absence of both cervix and uterus: Secondary | ICD-10-CM

## 2017-09-19 DIAGNOSIS — Z9889 Other specified postprocedural states: Secondary | ICD-10-CM | POA: Insufficient documentation

## 2017-09-19 NOTE — Patient Instructions (Signed)
Please notify Dr Denman George at phone number [phone] if you notice vaginal bleeding, new pelvic or abdominal pains, bloating, feeling full easy, or a change in bladder or bowel function.   Dr Serita Grit office will call you with your CA 125 result.  Please follow-up with Dr Denman George in June, 2019.

## 2017-09-19 NOTE — Progress Notes (Signed)
Follow Up Note: Gyn-Onc  [name] 51 y.o. female  CC:  Chief Complaint  Patient presents with  . 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   1/ Stage IIIC serous LMP of ovary: No evidence of recurrence on August 2018 MRI. Will follow with CA 125's and annual MRI (next due August 2019). If CA 125 elevated or doubled, will order MRI now. 2/ depression: to continue treatment with her psychologist. 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 Will not continue to follow these sites with reimaging.  Follow-up in 6 months (June, 2019) with exam and CA 125. MRI in August, 2019.  HPI:  Melissa Richards is a 51 year old G2P2 initially seen at the request of Dr Carlena Bjornstad for a large left ovarian mass and elevated CA 125 on November 1st, 2016.  She reported a 2 month history of increasing left sided abdominal discomfort and constipation and presented to the [facility] ER on 07/17/15 with symptoms of pelvic swelling, left lower back and abdominal pain.  A CT of the abdomen and pelvis on 07/17/15 showed bibasilar nodularity, a subcarinal 1.4cm node suggestive of infrahilar adenopathy, multiple small retroperitoneal nodes (none pathologic by size criteria) including a 54mm aortocaval node and a 1.5cm gastrohepatic ligament node. There is a portacaval node measuring 2.5 x 3.7cm (image/series 21/2) and a 1.2cm right external iliac node.  There is soft tissue fullness extending superiorly off the right ovary (measuring 6x10cm) and a dominant left ovarian cystic mass arising from the pelvis measuring  18 x 22.5cm.   A CA 125 was drawn on 07/23/15 and was elevated at 2782. (CEA was normal at 0.4).  Medical history significant for morbid obesity, OSA, asthma and non-cardiac shortness of breath. She underwent coronary catheterization 2 years ago for this, which was negative for blockage, and she did not require stenting, or antiplatelet therapy. She was referred to pulmonology who diagnosed her as having asthma. She is a diabetic.  A Chest Ct on 07/30/15 Revealed: 1. Bilateral small pulmonary nodules are concerning for pulmonary metastasis. 2. Mild mediastinal and bilateral hilar adenopathy is also concerning for metastatic disease. 3. Mildly enlarged LEFT supraclavicular lymph node is concerning for metastatic disease. This lymph node may be amenable to ultrasound-guided biopsy 4. Enlargement of the LEFT lobe of thyroid gland likely represents benign goiter.    Due to the findings of unresectable chest and upper abdominal disease, she was scheduled for neoadjuvant chemotherapy following histologic confirmation via biopsy.  Biopsy of the supraclavicular node on 08/05/15 revealed FOCAL CLUSTERS WITH GRANULOMAS, SEE COMMENT. LYMPHOID TISSUE PRESENT. NO MALIGNANT CELLS IDENTIFIED.  Paracentesis of the ascites and biopsy of the pelvic soft tissue mass on 08/18/15 revealed SEROUS TUMOR WITH FOCAL PROLIFERATION, with the ascites showing papillary fragments of serous tumor (at least LMP).  Due to these inconclusive findings for malignancy and a concern that this is a low malignant potential tumor which would not be responsive to chemotherapy, chemotherapy was cancelled and surgery was scheduled. If this disease is LMP, it is likely that the chest and upper abdominal adenopathy is non-malignant.  On 09/04/15, she underwent an exploratory laparotomy with TAH, bilateral salpingo-oophorectomy, omentectomy radical tumor  debulking for ovarian cancer. 22 modifier for extreme intraperitoneal obesity making surgical exposure  more complex and increasing duration of procedure by 1 hour by Dr. [name].  Final pathology revealed: 1. 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 - NON-INVASIVE IMPLANT ON FALLOPIAN TUBE SEROSA. 2. Adnexa - ovary +/- tube, neoplastic, right - SEROUS BORDERLINE TUMOR, 8 CM WITH OVARIAN SURFACE INVOLVEMENT. - NON-INVASIVE IMPLANT ON FALLOPIAN TUBE SEROSA. 3. Uterus and cervix - CERVIX: NABOTHIAN CYST. - ENDOMETRIUM: INACTIVE. NO EVIDENCE OF HYPERPLASIA OR CARCINOMA. - MYOMETRIUM: LEIOMYOMA. - UTERINE SEROSA AND ATTACHED SOFT TISSUE: NON-INVASIVE IMPLANTS OF SEROUS BORDERLINE TUMOR. 4. Omentum, resection for tumor - MICROSCOPIC NON-INVASIVE IMPLANT OF SEROUS BORDERLINE TUMOR. - FOCI OF INFLAMMATION AND HYPEREMIA. 5. Soft tissue mass, simple excision, sigmoid - SEROUS BORDERLINE TUMOR, 8 CM CONSISTENT WITH NON-INVASIVE IMPLANT.   At her postoperative evaluation she had staples removed and dehiscence of her wound. A wound vac was applied and managed as an outpatient. There was no cellulitis or infection.  The patient had application of a wound vac for approximately 3 months. The wound completely healed.   She had repeat imaging with CT chest on 02/23/16 and MR abdomen on 02/23/16. C CT of the chest showed stable supraclavicular and hilar lymphadenopathy consistent with a diagnosis of sarcoidosis and unlikely to be reflective of metastatic borderline tumor The MRI of the abdomen demonstrated stable porta hepatis nodes also consistent with likely sarcoidosis and not metastatic disease. However a new enhancing 1 cm nodule was identified in the right paracolic gutter that was slightly increased since the February 2017 MRI could not exclude recurrent tumor implant. There was no other evidence of peritoneal metastases or ascites.  Repeat MRI in August 2017 showed complete resolution of the right paracolic gutter implant. CA 125 on 05/26/16  was stable and normal at 23.  Interval History:   CA 125 on 08/09/16 was stable at 29.  Stable weight. She has persistent episodes of right mid abdominal cramping pain that feel like pulled muscles coming in waves ("like labor pains"). "they come and go".   CA 125 on 02/19/17 was stable at 20.6. MRI abdomen on 05/18/17 showed no metastatic disease, stable lymphadenopathy consistent with chronic inflammatory process.   Review of Systems  Constitutional: Feels physically well  Denies fever, chills, early satiety, unintentional weight loss or gain.  Cardiovascular: No chest pain, shortness of breath, or edema.  Pulmonary: No cough or wheeze.  Gastrointestinal: No nausea, vomiting, or diarrhea. No bright red blood per rectum or change in bowel movement. + occasional abdominal pains (on right) Genitourinary: No frequency, urgency, or dysuria. No vaginal bleeding or discharge.  Musculoskeletal: No myalgia or joint pain. Neurologic: No weakness, numbness, or change in gait.  Psychology: + depression, feels sad and weepy and anxious  Current Meds:  Outpatient Encounter Medications as of 09/19/2017  Medication Sig  . albuterol (PROVENTIL HFA;VENTOLIN HFA) 108 (90 BASE) MCG/ACT inhaler Inhale 1-2 puffs into the lungs every 6 (six) hours as needed for wheezing or shortness of breath. Reported on 02/23/2016  . atenolol (TENORMIN) 25 MG tablet Take 25 mg by mouth daily.  Marland Kitchen atorvastatin (LIPITOR) 40 MG tablet Take 40 mg by mouth daily at 6 PM.   . budesonide-formoterol (SYMBICORT) 160-4.5 MCG/ACT inhaler Inhale 2 puffs into the lungs 2 (two) times daily. Reported on 02/23/2016  . busPIRone (BUSPAR) 15 MG tablet Take 15 mg by mouth 2 (two) times daily.  Marland Kitchen  citalopram (CELEXA) 40 MG tablet Take 40 mg by mouth daily.  Marland Kitchen glipiZIDE (GLIPIZIDE XL) 5 MG 24 hr tablet Take 5 mg by mouth daily with breakfast.  . lisinopril (PRINIVIL,ZESTRIL) 20 MG tablet Take 20 mg by mouth daily.  . metFORMIN (GLUCOPHAGE) 500 MG  tablet Take 500 mg by mouth daily with breakfast.   . Multiple Vitamin (MULTIVITAMIN WITH MINERALS) TABS tablet Take 1 tablet by mouth daily.  . traZODone (DESYREL) 50 MG tablet Take 50 mg by mouth at bedtime.   No facility-administered encounter medications on file as of 09/19/2017.     Allergy:  Allergies  Allergen Reactions  . Hydrocodone Itching    Can only take w benadryl    Social Hx:   Social History   Socioeconomic History  . Marital status: Married    Spouse name: Not on file  . Number of children: Not on file  . Years of education: Not on file  . Highest education level: Not on file  Social Needs  . Financial resource strain: Not on file  . Food insecurity - worry: Not on file  . Food insecurity - inability: Not on file  . Transportation needs - medical: Not on file  . Transportation needs - non-medical: Not on file  Occupational History  . Not on file  Tobacco Use  . Smoking status: Never Smoker  . Smokeless tobacco: Never Used  Substance and Sexual Activity  . Alcohol use: No  . Drug use: No  . Sexual activity: Not on file  Other Topics Concern  . Not on file  Social History Narrative  . Not on file    Past Surgical Hx:  Past Surgical History:  Procedure Laterality Date  . ABDOMINAL HYSTERECTOMY Bilateral 09/04/2015   Procedure: HYSTERECTOMY TOTAL ABDOMINAL, EXPLORATORY LAPAROTOMY, BILATERAL SALPINGO OOPHERECTOMY, OMENTECTOMY, DEBULKING;  Surgeon: [name], MD;  Location: [facility];  Service: Gynecology;  Laterality: Bilateral;  . CARDIAC CATHETERIZATION     2 years ago  . TONSILLECTOMY    . TUBAL LIGATION      Past Medical Hx:  Past Medical History:  Diagnosis Date  . Adnexal mass    BILATERAL  . Anxiety   . Arthritis   . Asthma   . Constipation   . Depression   . Diabetes mellitus without complication ([place])   . Family history of adverse reaction to anesthesia    FAMILY MEMBERS ARE SLOW TO WAKE UP  . Hypertension   . Numbness and  tingling    FEET /LEGS  . ovarian ca dx'd 07/2015  . Recurrent UTI   . Sleep apnea    DOES NOT USE C -PAP    Family Hx:  Family History  Problem Relation Age of Onset  . Anesthesia problems Mother   . Diabetes Mother   . Hypertension Mother   . Cancer Father   . Diabetes Brother     Vitals:  Blood pressure 140/82, pulse 87, temperature 97.9 F (36.6 C), temperature source Oral, resp. rate 18, height 5\' 9"  (1.753 m), weight 260 lb (117.9 kg), SpO2 99 %.  Physical Exam:  General: Well developed, well nourished female in no acute distress. Alert and oriented x 3.  Cardiovascular: Regular rate and rhythm. S1 and S2 normal.  Lungs: Clear to auscultation bilaterally. No wheezes/crackles/rhonchi noted.  Skin: No rashes or lesions present. Back: No CVA tenderness.  Genitourinary: vaginal cuff well healed, no bleeding or discharge.No pelvic masses Rectal: no palpable masses or lesions. Abdomen: Abdomen  soft, non-tender and morbidly obese. Wound completely healed. Extremities: No bilateral cyanosis, edema, or clubbing.    Donaciano Eva, MD  09/19/2017, 3:27 PM

## 2017-09-20 LAB — CA 125: Cancer Antigen (CA) 125: 21.7 U/mL (ref 0.0–38.1)

## 2017-09-21 ENCOUNTER — Telehealth: Payer: Self-pay | Admitting: *Deleted

## 2017-09-21 NOTE — Telephone Encounter (Signed)
Patient called and requested her CA 125 requests. Per Melissa APP the patients results are stable, patient informed.

## 2017-10-27 DIAGNOSIS — F331 Major depressive disorder, recurrent, moderate: Secondary | ICD-10-CM | POA: Diagnosis not present

## 2017-11-09 DIAGNOSIS — Z0000 Encounter for general adult medical examination without abnormal findings: Secondary | ICD-10-CM | POA: Diagnosis not present

## 2017-11-09 DIAGNOSIS — E119 Type 2 diabetes mellitus without complications: Secondary | ICD-10-CM | POA: Diagnosis not present

## 2017-11-16 DIAGNOSIS — J209 Acute bronchitis, unspecified: Secondary | ICD-10-CM | POA: Diagnosis not present

## 2017-11-16 DIAGNOSIS — J029 Acute pharyngitis, unspecified: Secondary | ICD-10-CM | POA: Diagnosis not present

## 2017-11-16 DIAGNOSIS — E1165 Type 2 diabetes mellitus with hyperglycemia: Secondary | ICD-10-CM | POA: Diagnosis not present

## 2017-12-12 DIAGNOSIS — Z713 Dietary counseling and surveillance: Secondary | ICD-10-CM | POA: Diagnosis not present

## 2017-12-12 DIAGNOSIS — E119 Type 2 diabetes mellitus without complications: Secondary | ICD-10-CM | POA: Diagnosis not present

## 2018-01-13 DIAGNOSIS — J069 Acute upper respiratory infection, unspecified: Secondary | ICD-10-CM | POA: Diagnosis not present

## 2018-01-13 DIAGNOSIS — R509 Fever, unspecified: Secondary | ICD-10-CM | POA: Diagnosis not present

## 2018-01-13 DIAGNOSIS — J209 Acute bronchitis, unspecified: Secondary | ICD-10-CM | POA: Diagnosis not present

## 2018-01-13 DIAGNOSIS — E1165 Type 2 diabetes mellitus with hyperglycemia: Secondary | ICD-10-CM | POA: Diagnosis not present

## 2018-02-03 DIAGNOSIS — Z1231 Encounter for screening mammogram for malignant neoplasm of breast: Secondary | ICD-10-CM | POA: Diagnosis not present

## 2018-02-07 DIAGNOSIS — F331 Major depressive disorder, recurrent, moderate: Secondary | ICD-10-CM | POA: Diagnosis not present

## 2018-02-11 DIAGNOSIS — M545 Low back pain: Secondary | ICD-10-CM | POA: Diagnosis not present

## 2018-02-11 DIAGNOSIS — R300 Dysuria: Secondary | ICD-10-CM | POA: Diagnosis not present

## 2018-02-11 DIAGNOSIS — E1165 Type 2 diabetes mellitus with hyperglycemia: Secondary | ICD-10-CM | POA: Diagnosis not present

## 2018-02-17 DIAGNOSIS — K449 Diaphragmatic hernia without obstruction or gangrene: Secondary | ICD-10-CM | POA: Diagnosis not present

## 2018-02-17 DIAGNOSIS — M545 Low back pain: Secondary | ICD-10-CM | POA: Diagnosis not present

## 2018-03-24 ENCOUNTER — Inpatient Hospital Stay: Payer: BLUE CROSS/BLUE SHIELD

## 2018-03-24 ENCOUNTER — Encounter: Payer: Self-pay | Admitting: Gynecologic Oncology

## 2018-03-24 ENCOUNTER — Inpatient Hospital Stay: Payer: BLUE CROSS/BLUE SHIELD | Attending: Gynecologic Oncology | Admitting: Gynecologic Oncology

## 2018-03-24 VITALS — BP 131/61 | HR 83 | Temp 98.1°F | Resp 20 | Ht 62.0 in | Wt 255.0 lb

## 2018-03-24 DIAGNOSIS — Z90710 Acquired absence of both cervix and uterus: Secondary | ICD-10-CM | POA: Diagnosis not present

## 2018-03-24 DIAGNOSIS — Z90722 Acquired absence of ovaries, bilateral: Secondary | ICD-10-CM | POA: Diagnosis not present

## 2018-03-24 DIAGNOSIS — D3910 Neoplasm of uncertain behavior of unspecified ovary: Secondary | ICD-10-CM

## 2018-03-24 NOTE — Progress Notes (Signed)
Follow Up Note: Gyn-Onc  Melissa Richards 52 y.o. female  CC:  Chief Complaint  Patient presents with  . 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   1/ Stage IIIC serous LMP of ovary: No evidence of recurrence on August 2018 MRI. Will follow with CA 125's and annual MRI (next due August 2019). If CA 125 elevated or doubled, will order MRI now. 2/ depression: to continue treatment with her psychologist. 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 Will not continue to follow these sites with reimaging. 4/ right flank pain and hematuria - negative ED work up for infection or stones (though does not sound like she has imaging. MRI now to monitor right paracolic gutter nodularity that had been previously seen, then annually if negative.  Follow-up in 6 months (December, 2019) with exam and CA 125.   HPI:  Melissa Richards is a 52 year old G2P2 initially seen at the request of Dr Carlena Bjornstad for a large left ovarian mass and elevated CA 125 on November 1st, 2016.  She reported a 2 month history of increasing left sided abdominal discomfort and constipation and presented to the [facility] ER on 07/17/15 with symptoms of pelvic swelling, left lower back and abdominal pain.  A CT of the abdomen and pelvis on 07/17/15 showed bibasilar nodularity, a subcarinal 1.4cm node suggestive of infrahilar adenopathy, multiple small retroperitoneal nodes (none pathologic by size criteria) including a 68mm aortocaval node and a 1.5cm gastrohepatic ligament node. There is a portacaval node measuring 2.5 x 3.7cm  (image/series 21/2) and a 1.2cm right external iliac node.  There is soft tissue fullness extending superiorly off the right ovary (measuring 6x10cm) and a dominant left ovarian cystic mass arising from the pelvis measuring 18 x 22.5cm.   A CA 125 was drawn on 07/23/15 and was elevated at 2782. (CEA was normal at 0.4).  Medical history significant for morbid obesity, OSA, asthma and non-cardiac shortness of breath. She underwent coronary catheterization 2 years ago for this, which was negative for blockage, and she did not require stenting, or antiplatelet therapy. She was referred to pulmonology who diagnosed her as having asthma. She is a diabetic.  A Chest Ct on 07/30/15 Revealed: 1. Bilateral small pulmonary nodules are concerning for pulmonary metastasis. 2. Mild mediastinal and bilateral hilar adenopathy is also concerning for metastatic disease. 3. Mildly enlarged LEFT supraclavicular lymph node is concerning for metastatic disease. This lymph node may be amenable to ultrasound-guided biopsy 4. Enlargement of the LEFT lobe of thyroid gland likely represents benign goiter.    Due to the findings of unresectable chest and upper abdominal disease, she was scheduled for neoadjuvant chemotherapy following histologic confirmation via biopsy.  Biopsy of the supraclavicular node on 08/05/15 revealed FOCAL CLUSTERS WITH GRANULOMAS, SEE COMMENT. LYMPHOID TISSUE PRESENT. NO MALIGNANT CELLS IDENTIFIED.  Paracentesis of the ascites and biopsy of the pelvic soft tissue mass on 08/18/15 revealed SEROUS TUMOR WITH FOCAL PROLIFERATION, with the ascites showing papillary fragments of serous tumor (at least LMP).  Due to these inconclusive findings for malignancy and a concern that this is a low malignant potential tumor which would not be responsive to chemotherapy, chemotherapy was  cancelled and surgery was scheduled. If this disease is LMP, it is likely that the chest and upper abdominal adenopathy is non-malignant.   On 09/04/15, she underwent an exploratory laparotomy with TAH, bilateral salpingo-oophorectomy, omentectomy radical tumor debulking for ovarian cancer. 22 modifier for extreme intraperitoneal obesity making surgical exposure more complex and increasing duration of procedure by 1 hour by Dr. [name].  Final pathology revealed: 1. 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 - NON-INVASIVE IMPLANT ON FALLOPIAN TUBE SEROSA. 2. Adnexa - ovary +/- tube, neoplastic, right - SEROUS BORDERLINE TUMOR, 8 CM WITH OVARIAN SURFACE INVOLVEMENT. - NON-INVASIVE IMPLANT ON FALLOPIAN TUBE SEROSA. 3. Uterus and cervix - CERVIX: NABOTHIAN CYST. - ENDOMETRIUM: INACTIVE. NO EVIDENCE OF HYPERPLASIA OR CARCINOMA. - MYOMETRIUM: LEIOMYOMA. - UTERINE SEROSA AND ATTACHED SOFT TISSUE: NON-INVASIVE IMPLANTS OF SEROUS BORDERLINE TUMOR. 4. Omentum, resection for tumor - MICROSCOPIC NON-INVASIVE IMPLANT OF SEROUS BORDERLINE TUMOR. - FOCI OF INFLAMMATION AND HYPEREMIA. 5. Soft tissue mass, simple excision, sigmoid - SEROUS BORDERLINE TUMOR, 8 CM CONSISTENT WITH NON-INVASIVE IMPLANT.   At her postoperative evaluation she had staples removed and dehiscence of her wound. A wound vac was applied and managed as an outpatient. There was no cellulitis or infection.  The patient had application of a wound vac for approximately 3 months. The wound completely healed.   She had repeat imaging with CT chest on 02/23/16 and MR abdomen on 02/23/16. C CT of the chest showed stable supraclavicular and hilar lymphadenopathy consistent with a diagnosis of sarcoidosis and unlikely to be reflective of metastatic borderline tumor The MRI of the abdomen demonstrated stable porta hepatis nodes also consistent with likely sarcoidosis and not metastatic disease. However a new enhancing 1 cm nodule was identified in the right paracolic gutter that was slightly increased since the February 2017  MRI could not exclude recurrent tumor implant. There was no other evidence of peritoneal metastases or ascites.  Repeat MRI in August 2017 showed complete resolution of the right paracolic gutter implant. CA 125 on 05/26/16 was stable and normal at 23.  Interval History:   CA 125 on 08/09/16 was stable at 29.  Stable weight. She has persistent episodes of right mid abdominal cramping pain that feel like pulled muscles coming in waves ("like labor pains"). "they come and go".   CA 125 on 02/19/17 was stable at 20.6. MRI abdomen on 05/18/17 showed no metastatic disease, stable lymphadenopathy consistent with chronic inflammatory process.  CA 125 on 09/19/17 was stable at 21.7.   Review of Systems  Constitutional: Feels physically well  Denies fever, chills, early satiety, unintentional weight loss or gain.  Cardiovascular: No chest pain, shortness of breath, or edema.  Pulmonary: No cough or wheeze.  Gastrointestinal: No nausea, vomiting, or diarrhea. No bright red blood per rectum or change in bowel movement. + occasional abdominal pains (on right) Genitourinary: No frequency, urgency, or dysuria. No vaginal bleeding or discharge.  Musculoskeletal: No myalgia or joint pain. Neurologic: No weakness, numbness, or change in gait.  Psychology: + depression, feels sad and weepy and anxious  Current Meds:  Outpatient Encounter Medications as of 03/24/2018  Medication Sig  . albuterol (PROVENTIL HFA;VENTOLIN HFA) 108 (90 BASE) MCG/ACT inhaler Inhale 1-2 puffs into the lungs every 6 (six) hours as needed for wheezing or shortness of breath. Reported on 02/23/2016  . atenolol (TENORMIN) 25 MG tablet Take 25 mg by mouth daily.  Marland Kitchen atorvastatin (LIPITOR) 40 MG tablet Take 40  mg by mouth daily at 6 PM.   . budesonide-formoterol (SYMBICORT) 160-4.5 MCG/ACT inhaler Inhale 2 puffs into the lungs 2 (two) times daily. Reported on 02/23/2016  . busPIRone (BUSPAR) 15 MG tablet Take 15 mg by mouth 2 (two) times  daily.  . cholecalciferol (VITAMIN D) 1000 units tablet Take 1,000 Units by mouth daily.  . citalopram (CELEXA) 40 MG tablet Take 40 mg by mouth daily.  Marland Kitchen glipiZIDE (GLIPIZIDE XL) 5 MG 24 hr tablet Take 5 mg by mouth daily with breakfast.  . lisinopril (PRINIVIL,ZESTRIL) 20 MG tablet Take 20 mg by mouth daily.  . metFORMIN (GLUCOPHAGE) 500 MG tablet Take 500 mg by mouth daily with breakfast. Patient takes this medication twice a day.  . Multiple Vitamin (MULTIVITAMIN WITH MINERALS) TABS tablet Take 1 tablet by mouth daily.  . traZODone (DESYREL) 50 MG tablet Take 50 mg by mouth at bedtime.   No facility-administered encounter medications on file as of 03/24/2018.     Allergy:  Allergies  Allergen Reactions  . Hydrocodone Itching    Can only take w benadryl    Social Hx:   Social History   Socioeconomic History  . Marital status: Married    Spouse name: Not on file  . Number of children: Not on file  . Years of education: Not on file  . Highest education level: Not on file  Occupational History  . Not on file  Social Needs  . Financial resource strain: Not on file  . Food insecurity:    Worry: Not on file    Inability: Not on file  . Transportation needs:    Medical: Not on file    Non-medical: Not on file  Tobacco Use  . Smoking status: Never Smoker  . Smokeless tobacco: Never Used  Substance and Sexual Activity  . Alcohol use: No  . Drug use: No  . Sexual activity: Not on file  Lifestyle  . Physical activity:    Days per week: Not on file    Minutes per session: Not on file  . Stress: Not on file  Relationships  . Social connections:    Talks on phone: Not on file    Gets together: Not on file    Attends religious service: Not on file    Active member of club or organization: Not on file    Attends meetings of clubs or organizations: Not on file    Relationship status: Not on file  . Intimate partner violence:    Fear of current or ex partner: Not on file     Emotionally abused: Not on file    Physically abused: Not on file    Forced sexual activity: Not on file  Other Topics Concern  . Not on file  Social History Narrative  . Not on file    Past Surgical Hx:  Past Surgical History:  Procedure Laterality Date  . ABDOMINAL HYSTERECTOMY Bilateral 09/04/2015   Procedure: HYSTERECTOMY TOTAL ABDOMINAL, EXPLORATORY LAPAROTOMY, BILATERAL SALPINGO OOPHERECTOMY, OMENTECTOMY, DEBULKING;  Surgeon: [name], MD;  Location: [facility];  Service: Gynecology;  Laterality: Bilateral;  . CARDIAC CATHETERIZATION     2 years ago  . TONSILLECTOMY    . TUBAL LIGATION      Past Medical Hx:  Past Medical History:  Diagnosis Date  . Adnexal mass    BILATERAL  . Anxiety   . Arthritis   . Asthma   . Constipation   . Depression   . Diabetes mellitus without  complication ([place])   . Family history of adverse reaction to anesthesia    FAMILY MEMBERS ARE SLOW TO WAKE UP  . Hypertension   . Numbness and tingling    FEET /LEGS  . ovarian ca dx'd 07/2015  . Recurrent UTI   . Sleep apnea    DOES NOT USE C -PAP    Family Hx:  Family History  Problem Relation Age of Onset  . Anesthesia problems Mother   . Diabetes Mother   . Hypertension Mother   . Cancer Father   . Diabetes Brother     Vitals:  Blood pressure 131/61, pulse 83, temperature 98.1 F (36.7 C), temperature source Oral, resp. rate 20, height 5\' 2"  (1.575 m), weight 255 lb (115.7 kg), SpO2 99 %.  Physical Exam:  General: Well developed, well nourished female in no acute distress. Alert and oriented x 3.  Cardiovascular: Regular rate and rhythm. S1 and S2 normal.  Lungs: Clear to auscultation bilaterally. No wheezes/crackles/rhonchi noted.  Skin: No rashes or lesions present. Back: No CVA tenderness.  Genitourinary: vaginal cuff well healed, no bleeding or discharge.No pelvic masses. Speculum exam revealed no blood.  Rectal: no palpable masses or lesions. Abdomen: Abdomen soft, non-tender  and morbidly obese. Wound completely healed. Extremities: No bilateral cyanosis, edema, or clubbing.    Thereasa Solo, MD  03/24/2018, 1:52 PM

## 2018-03-24 NOTE — Patient Instructions (Signed)
Dr Denman George is moving up your MRI scans to evaluate for any signs of recurrent tumor as the cause of your symptoms.  Dr Denman George is checking the CA 125 today and will check it again in 3 months and again at 6 months.  Dr Denman George will see you back for check up in December.  Please notify Dr Denman George at phone number [phone] if you notice vaginal bleeding, new pelvic or abdominal pains, bloating, feeling full easy, or a change in bladder or bowel function.   If you notice new blood in the urine, please let Dr Denman George know and she will have you seen by the urology doctors.

## 2018-03-25 LAB — CA 125: Cancer Antigen (CA) 125: 22.3 U/mL (ref 0.0–38.1)

## 2018-03-28 ENCOUNTER — Telehealth: Payer: Self-pay | Admitting: Oncology

## 2018-03-28 NOTE — Telephone Encounter (Signed)
Melissa Richards called and asked for the results of her CA 125 test.  Advised her that it is good at 22.3.  She verbalized understanding and did not have any further questions.

## 2018-04-03 ENCOUNTER — Ambulatory Visit (HOSPITAL_COMMUNITY)
Admission: RE | Admit: 2018-04-03 | Discharge: 2018-04-03 | Disposition: A | Discharge status: Home / Self Care | Payer: BLUE CROSS/BLUE SHIELD | Source: Ambulatory Visit | Attending: Gynecologic Oncology | Admitting: Gynecologic Oncology

## 2018-04-03 DIAGNOSIS — R590 Localized enlarged lymph nodes: Secondary | ICD-10-CM | POA: Insufficient documentation

## 2018-04-03 DIAGNOSIS — K760 Fatty (change of) liver, not elsewhere classified: Secondary | ICD-10-CM | POA: Insufficient documentation

## 2018-04-03 DIAGNOSIS — D3910 Neoplasm of uncertain behavior of unspecified ovary: Secondary | ICD-10-CM | POA: Diagnosis not present

## 2018-04-03 MED ORDER — GADOBENATE DIMEGLUMINE 529 MG/ML IV SOLN
20.0000 mL | Freq: Once | INTRAVENOUS | Status: AC | PRN
  Administered 2018-04-03: 20 mL via INTRAVENOUS

## 2018-04-04 LAB — POCT I-STAT CREATININE: Creatinine, Ser: 1.1 mg/dL — ABNORMAL HIGH (ref 0.44–1.00)

## 2018-04-05 ENCOUNTER — Telehealth: Payer: Self-pay

## 2018-04-05 NOTE — Telephone Encounter (Signed)
Returned pt's call, she is requesting results of her pelvic/ abd MRI.  Per [name] NP: "everything is stable, No findings of recurrence or metastatic disease.  Pt voiced understanding, no other needs at this time per pt .

## 2018-04-20 DIAGNOSIS — F419 Anxiety disorder, unspecified: Secondary | ICD-10-CM | POA: Diagnosis not present

## 2018-04-20 DIAGNOSIS — F329 Major depressive disorder, single episode, unspecified: Secondary | ICD-10-CM | POA: Diagnosis not present

## 2018-04-20 DIAGNOSIS — Z79899 Other long term (current) drug therapy: Secondary | ICD-10-CM | POA: Diagnosis not present

## 2018-04-20 DIAGNOSIS — E119 Type 2 diabetes mellitus without complications: Secondary | ICD-10-CM | POA: Diagnosis not present

## 2018-04-20 DIAGNOSIS — J45909 Unspecified asthma, uncomplicated: Secondary | ICD-10-CM | POA: Diagnosis not present

## 2018-04-25 ENCOUNTER — Telehealth: Payer: Self-pay | Admitting: Gynecologic Oncology

## 2018-04-25 NOTE — Telephone Encounter (Signed)
FAXED RECORDS TO [facility] RELEASE ID [id number]

## 2018-05-23 DIAGNOSIS — F331 Major depressive disorder, recurrent, moderate: Secondary | ICD-10-CM | POA: Diagnosis not present

## 2018-06-27 ENCOUNTER — Telehealth: Payer: Self-pay

## 2018-06-27 NOTE — Telephone Encounter (Signed)
Faxed order for CA-125 with Dx code information. 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

## 2018-07-18 DIAGNOSIS — R0789 Other chest pain: Secondary | ICD-10-CM | POA: Diagnosis not present

## 2018-07-18 DIAGNOSIS — R0609 Other forms of dyspnea: Secondary | ICD-10-CM | POA: Diagnosis not present

## 2018-07-18 DIAGNOSIS — E559 Vitamin D deficiency, unspecified: Secondary | ICD-10-CM | POA: Diagnosis not present

## 2018-07-18 DIAGNOSIS — E119 Type 2 diabetes mellitus without complications: Secondary | ICD-10-CM | POA: Diagnosis not present

## 2018-07-18 DIAGNOSIS — I10 Essential (primary) hypertension: Secondary | ICD-10-CM | POA: Diagnosis not present

## 2018-07-18 DIAGNOSIS — E785 Hyperlipidemia, unspecified: Secondary | ICD-10-CM | POA: Diagnosis not present

## 2018-07-18 DIAGNOSIS — F329 Major depressive disorder, single episode, unspecified: Secondary | ICD-10-CM | POA: Diagnosis not present

## 2018-07-18 DIAGNOSIS — F419 Anxiety disorder, unspecified: Secondary | ICD-10-CM | POA: Diagnosis not present

## 2018-07-28 ENCOUNTER — Telehealth: Payer: Self-pay | Admitting: *Deleted

## 2018-07-28 NOTE — Telephone Encounter (Signed)
Patient called requesting "Can my appt be moved from December 6th to the weeks between December 23rd and January 2nd?" Advised the patient we have no opening at this moment, but to call back the end of November to see if we open more days

## 2018-08-10 ENCOUNTER — Ambulatory Visit: Payer: BLUE CROSS/BLUE SHIELD | Admitting: Cardiology

## 2018-08-10 ENCOUNTER — Encounter: Payer: Self-pay | Admitting: Cardiology

## 2018-08-10 VITALS — BP 128/72 | HR 85 | Ht 62.0 in | Wt 255.0 lb

## 2018-08-10 DIAGNOSIS — R971 Elevated cancer antigen 125 [CA 125]: Secondary | ICD-10-CM

## 2018-08-10 DIAGNOSIS — G4733 Obstructive sleep apnea (adult) (pediatric): Secondary | ICD-10-CM

## 2018-08-10 DIAGNOSIS — I10 Essential (primary) hypertension: Secondary | ICD-10-CM

## 2018-08-10 DIAGNOSIS — D869 Sarcoidosis, unspecified: Secondary | ICD-10-CM

## 2018-08-10 DIAGNOSIS — R0789 Other chest pain: Secondary | ICD-10-CM | POA: Insufficient documentation

## 2018-08-10 DIAGNOSIS — Z6841 Body Mass Index (BMI) 40.0 and over, adult: Secondary | ICD-10-CM

## 2018-08-10 HISTORY — DX: Other chest pain: R07.89

## 2018-08-10 HISTORY — DX: Sarcoidosis, unspecified: D86.9

## 2018-08-10 MED ORDER — NITROGLYCERIN 0.4 MG SL SUBL: 0.4000 mg | SUBLINGUAL_TABLET | SUBLINGUAL | 11 refills | Status: DC | PRN

## 2018-08-10 NOTE — Progress Notes (Signed)
Cardiology Office Note:    Date:  08/10/2018   ID:  Melissa Richards, DOB 03/22/1966, MRN [id number]  PCP:  Serita Grammes, MD  Cardiologist:  Jenean Lindau, MD   Referring MD: Serita Grammes, MD    ASSESSMENT:    1. Atypical chest pain   2. Essential hypertension   3. Elevated CA-125   4. Morbid obesity with BMI of 45.0-49.9, adult ([name])   5. Obstructive sleep apnea   6. Sarcoidosis    PLAN:    In order of problems listed above:  1. Secondary prevention stressed with the patient.  Importance of compliance with diet and medication stressed and she vocalized understanding.  Her blood pressure is stable. 2. Sublingual nitroglycerin prescription was sent, its protocol and 911 protocol explained and the patient vocalized understanding questions were answered to the patient's satisfaction 3. Diet was discussed with dyslipidemia diabetes mellitus this and obesity.  Risks of obesity explained she will make sure sure she will diet better and try to lose weight.  Her primary care physician is following this very aggressively.  Sleep health issues were discussed. 4. Echocardiogram will be done to assess murmur heard on auscultation.  It will also help me assess her heart in view of the fact that she has a diagnosis of sarcoidosis.  In view of her symptoms she will have a Lexiscan sestamibi.  This will help me assess objective evidence of any coronary artery disease.  She knows to go to the nearest emergency room for any significant concerns.Patient will be seen in follow-up appointment in 6 months or earlier if the patient has any concerns    Medication Adjustments/Labs and Tests Ordered: Current medicines are reviewed at length with the patient today.  Concerns regarding medicines are outlined above.  No orders of the defined types were placed in this encounter.  No orders of the defined types were placed in this encounter.    History of Present Illness:    Melissa Richards is a 52  y.o. female who is being seen today for the evaluation of chest pain at the request of Serita Grammes, MD.  Patient is a pleasant 52 year old female.  She has past medical history of essential hypertension, dyslipidemia, diabetes mellitus, sleep apnea and morbid obesity.  She mentions to me that when she was driving she had substernal chest which was sharp in nature.  She says she felt that it may have gone to the neck.  No orthopnea or PND.  She does not have any nitroglycerin and has not used any.  For this reason she was sent here for evaluation.  At the time of my evaluation, the patient is alert awake oriented and in no distress.  She mentions to me that she underwent coronary angiography about 5 years ago and she was told that it was normal.  Past Medical History:  Diagnosis Date  . Adnexal mass    BILATERAL  . Anxiety   . Arthritis   . Asthma   . Constipation   . Depression   . Diabetes mellitus without complication ([place])   . Family history of adverse reaction to anesthesia    FAMILY MEMBERS ARE SLOW TO WAKE UP  . Hypertension   . Numbness and tingling    FEET /LEGS  . ovarian ca dx'd 07/2015  . Recurrent UTI   . Sleep apnea    DOES NOT USE C -PAP    Past Surgical History:  Procedure Laterality Date  . ABDOMINAL HYSTERECTOMY Bilateral  09/04/2015   Procedure: HYSTERECTOMY TOTAL ABDOMINAL, EXPLORATORY LAPAROTOMY, BILATERAL SALPINGO OOPHERECTOMY, OMENTECTOMY, DEBULKING;  Surgeon: [name], MD;  Location: [facility];  Service: Gynecology;  Laterality: Bilateral;  . CARDIAC CATHETERIZATION     2 years ago  . TONSILLECTOMY    . TUBAL LIGATION      Current Medications: Current Meds  Medication Sig  . albuterol (PROVENTIL HFA;VENTOLIN HFA) 108 (90 BASE) MCG/ACT inhaler Inhale 1-2 puffs into the lungs every 6 (six) hours as needed for wheezing or shortness of breath. Reported on 02/23/2016  . atenolol (TENORMIN) 25 MG tablet Take 25 mg by mouth daily.  Marland Kitchen atorvastatin (LIPITOR) 40  MG tablet Take 40 mg by mouth daily at 6 PM.   . budesonide-formoterol (SYMBICORT) 160-4.5 MCG/ACT inhaler Inhale 2 puffs into the lungs 2 (two) times daily. Reported on 02/23/2016  . busPIRone (BUSPAR) 15 MG tablet Take 15 mg by mouth 2 (two) times daily.  . cholecalciferol (VITAMIN D) 1000 units tablet Take 1,000 Units by mouth once a week.   . citalopram (CELEXA) 40 MG tablet Take 40 mg by mouth daily.  Marland Kitchen glipiZIDE (GLIPIZIDE XL) 5 MG 24 hr tablet Take 5 mg by mouth daily with breakfast.  . lisinopril (PRINIVIL,ZESTRIL) 20 MG tablet Take 20 mg by mouth daily.  . metFORMIN (GLUCOPHAGE) 500 MG tablet Take 500 mg by mouth daily with breakfast. Patient takes this medication twice a day.  . Multiple Vitamin (MULTIVITAMIN WITH MINERALS) TABS tablet Take 1 tablet by mouth daily.  . traZODone (DESYREL) 50 MG tablet Take 50 mg by mouth at bedtime.     Allergies:   Hydrocodone   Social History   Socioeconomic History  . Marital status: Married    Spouse name: Not on file  . Number of children: Not on file  . Years of education: Not on file  . Highest education level: Not on file  Occupational History  . Not on file  Social Needs  . Financial resource strain: Not on file  . Food insecurity:    Worry: Not on file    Inability: Not on file  . Transportation needs:    Medical: Not on file    Non-medical: Not on file  Tobacco Use  . Smoking status: Never Smoker  . Smokeless tobacco: Never Used  Substance and Sexual Activity  . Alcohol use: No  . Drug use: No  . Sexual activity: Not on file  Lifestyle  . Physical activity:    Days per week: Not on file    Minutes per session: Not on file  . Stress: Not on file  Relationships  . Social connections:    Talks on phone: Not on file    Gets together: Not on file    Attends religious service: Not on file    Active member of club or organization: Not on file    Attends meetings of clubs or organizations: Not on file    Relationship  status: Not on file  Other Topics Concern  . Not on file  Social History Narrative  . Not on file     Family History: The patient's family history includes Anesthesia problems in her mother; Cancer in her father; Diabetes in her brother and mother; Hypertension in her mother.  ROS:   Please see the history of present illness.    All other systems reviewed and are negative.  EKGs/Labs/Other Studies Reviewed:    The following studies were reviewed today: I discussed my findings with the  patient at extensive length.   Recent Labs: 04/03/2018: Creatinine, Ser 1.10  Recent Lipid Panel No results found for: CHOL, TRIG, HDL, CHOLHDL, VLDL, LDLCALC, LDLDIRECT  Physical Exam:    VS:  BP 128/72 (BP Location: Right Arm, Patient Position: Sitting, Cuff Size: Normal)   Pulse 85   Ht 5\' 2"  (1.575 m)   Wt 255 lb (115.7 kg)   SpO2 98%   BMI 46.64 kg/m     Wt Readings from Last 3 Encounters:  08/10/18 255 lb (115.7 kg)  03/24/18 255 lb (115.7 kg)  09/19/17 260 lb (117.9 kg)     GEN: Patient is in no acute distress HEENT: Normal NECK: No JVD; No carotid bruits LYMPHATICS: No lymphadenopathy CARDIAC: S1 S2 regular, 2/6 systolic murmur at the apex. RESPIRATORY:  Clear to auscultation without rales, wheezing or rhonchi  ABDOMEN: Soft, non-tender, non-distended MUSCULOSKELETAL:  No edema; No deformity  SKIN: Warm and dry NEUROLOGIC:  Alert and oriented x 3 PSYCHIATRIC:  Normal affect    Signed, Jenean Lindau, MD  08/10/2018 9:31 AM    [place]

## 2018-08-10 NOTE — Addendum Note (Signed)
Addended by: Mattie Marlin on: 08/10/2018 09:42 AM   Modules accepted: Orders

## 2018-08-10 NOTE — Patient Instructions (Signed)
Medication Instructions:  Your physician has recommended you make the following change in your medication:   START Nitroglycerin 0.4 mg sublingual (under your tongue) as needed for chest pain. If experiencing chest pain, stop what you are doing and sit down. Take 1 nitroglycerin and wait 5 minutes. If chest pain continues, take another nitroglycerin and wait 5 minutes. If chest pain does not subside, take 1 more nitroglycerin and dial 911. You make take a total of 3 nitroglycerin in a 15 minute time frame.  If you need a refill on your cardiac medications before your next appointment, please call your pharmacy.   Lab work: None  If you have labs (blood work) drawn today and your tests are completely normal, you will receive your results only by: Marland Kitchen MyChart Message (if you have MyChart) OR . A paper copy in the mail If you have any lab test that is abnormal or we need to change your treatment, we will call you to review the results.  Testing/Procedures: Your physician has requested that you have an echocardiogram. Echocardiography is a painless test that uses sound waves to create images of your heart. It provides your doctor with information about the size and shape of your heart and how well your heart's chambers and valves are working. This procedure takes approximately one hour. There are no restrictions for this procedure.  Your physician has requested that you have a lexiscan myoview. For further information please visit [email or web address]. Please follow instruction sheet, as given.  Follow-Up: At [facility], you and your health needs are our priority.  As part of our continuing mission to provide you with exceptional heart care, we have created designated Provider Care Teams.  These Care Teams include your primary Cardiologist (physician) and Advanced Practice Providers (APPs -  Physician Assistants and Nurse Practitioners) who all work together to provide you with the care you need,  when you need it.  You will need a follow up appointment in 6 months.  Please call our office 2 months in advance to schedule this appointment.  You may see another member of our [employer] Provider Team in [place]: Jenne Campus, MD . Shirlee More, MD  Any Other Special Instructions Will Be Listed Below (If Applicable).  Nitroglycerin sublingual tablets What is this medicine? NITROGLYCERIN (nye troe GLI ser in) is a type of vasodilator. It relaxes blood vessels, increasing the blood and oxygen supply to your heart. This medicine is used to relieve chest pain caused by angina. It is also used to prevent chest pain before activities like climbing stairs, going outdoors in cold weather, or sexual activity. This medicine may be used for other purposes; ask your health care provider or pharmacist if you have questions. COMMON BRAND NAME(S): Nitroquick, Nitrostat, Nitrotab What should I tell my health care provider before I take this medicine? They need to know if you have any of these conditions: -anemia -head injury, recent stroke, or bleeding in the brain -liver disease -previous heart attack -an unusual or allergic reaction to nitroglycerin, other medicines, foods, dyes, or preservatives -pregnant or trying to get pregnant -breast-feeding How should I use this medicine? Take this medicine by mouth as needed. At the first sign of an angina attack (chest pain or tightness) place one tablet under your tongue. You can also take this medicine 5 to 10 minutes before an event likely to produce chest pain. Follow the directions on the prescription label. Let the tablet dissolve under the tongue. Do not swallow whole.  Replace the dose if you accidentally swallow it. It will help if your mouth is not dry. Saliva around the tablet will help it to dissolve more quickly. Do not eat or drink, smoke or chew tobacco while a tablet is dissolving. If you are not better within 5 minutes after taking ONE  dose of nitroglycerin, call 9-1-1 immediately to seek emergency medical care. Do not take more than 3 nitroglycerin tablets over 15 minutes. If you take this medicine often to relieve symptoms of angina, your doctor or health care professional may provide you with different instructions to manage your symptoms. If symptoms do not go away after following these instructions, it is important to call 9-1-1 immediately. Do not take more than 3 nitroglycerin tablets over 15 minutes. Talk to your pediatrician regarding the use of this medicine in children. Special care may be needed. Overdosage: If you think you have taken too much of this medicine contact a poison control center or emergency room at once. NOTE: This medicine is only for you. Do not share this medicine with others. What if I miss a dose? This does not apply. This medicine is only used as needed. What may interact with this medicine? Do not take this medicine with any of the following medications: -certain migraine medicines like ergotamine and dihydroergotamine (DHE) -medicines used to treat erectile dysfunction like sildenafil, tadalafil, and vardenafil -riociguat This medicine may also interact with the following medications: -alteplase -aspirin -heparin -medicines for high blood pressure -medicines for mental depression -other medicines used to treat angina -phenothiazines like chlorpromazine, mesoridazine, prochlorperazine, thioridazine This list may not describe all possible interactions. Give your health care provider a list of all the medicines, herbs, non-prescription drugs, or dietary supplements you use. Also tell them if you smoke, drink alcohol, or use illegal drugs. Some items may interact with your medicine. What should I watch for while using this medicine? Tell your doctor or health care professional if you feel your medicine is no longer working. Keep this medicine with you at all times. Sit or lie down when you take  your medicine to prevent falling if you feel dizzy or faint after using it. Try to remain calm. This will help you to feel better faster. If you feel dizzy, take several deep breaths and lie down with your feet propped up, or bend forward with your head resting between your knees. You may get drowsy or dizzy. Do not drive, use machinery, or do anything that needs mental alertness until you know how this drug affects you. Do not stand or sit up quickly, especially if you are an older patient. This reduces the risk of dizzy or fainting spells. Alcohol can make you more drowsy and dizzy. Avoid alcoholic drinks. Do not treat yourself for coughs, colds, or pain while you are taking this medicine without asking your doctor or health care professional for advice. Some ingredients may increase your blood pressure. What side effects may I notice from receiving this medicine? Side effects that you should report to your doctor or health care professional as soon as possible: -blurred vision -dry mouth -skin rash -sweating -the feeling of extreme pressure in the head -unusually weak or tired Side effects that usually do not require medical attention (report to your doctor or health care professional if they continue or are bothersome): -flushing of the face or neck -headache -irregular heartbeat, palpitations -nausea, vomiting This list may not describe all possible side effects. Call your doctor for medical advice about side  effects. You may report side effects to FDA at 1-800-FDA-1088. Where should I keep my medicine? Keep out of the reach of children. Store at room temperature between 20 and 25 degrees C (68 and 77 degrees F). Store in [employer]. Protect from light and moisture. Keep tightly closed. Throw away any unused medicine after the expiration date. NOTE: This sheet is a summary. It may not cover all possible information. If you have questions about this medicine, talk to your doctor,  pharmacist, or health care provider.  2018 Elsevier/Gold Standard (2013-07-19 17:57:36)

## 2018-09-08 ENCOUNTER — Inpatient Hospital Stay: Payer: BLUE CROSS/BLUE SHIELD | Attending: Gynecologic Oncology | Admitting: Gynecologic Oncology

## 2018-09-08 ENCOUNTER — Encounter: Payer: Self-pay | Admitting: Gynecologic Oncology

## 2018-09-08 ENCOUNTER — Inpatient Hospital Stay: Payer: BLUE CROSS/BLUE SHIELD

## 2018-09-08 VITALS — BP 138/83 | HR 76 | Temp 98.2°F | Resp 20 | Ht 62.0 in | Wt 257.0 lb

## 2018-09-08 DIAGNOSIS — Z90722 Acquired absence of ovaries, bilateral: Secondary | ICD-10-CM | POA: Insufficient documentation

## 2018-09-08 DIAGNOSIS — Z90710 Acquired absence of both cervix and uterus: Secondary | ICD-10-CM | POA: Diagnosis not present

## 2018-09-08 DIAGNOSIS — D3910 Neoplasm of uncertain behavior of unspecified ovary: Secondary | ICD-10-CM

## 2018-09-08 NOTE — Progress Notes (Signed)
Follow Up Note: Gyn-Onc  Melissa Richards 52 y.o. female  CC:  Chief Complaint  Patient presents with  . 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   1/ Stage IIIC serous LMP of ovary: No evidence of recurrence on August 2018 MRI. Will follow with CA 125's and annual MRI (next due August 2019). If CA 125 elevated or doubled, will order MRI now. 2/ depression: to continue treatment with her psychologist. 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 Will not continue to follow these sites with reimaging. Next scan (CT abd/pelvis) ordered for January, 2020.   Follow-up in 6 months (June, 2020) with exam and CA 125.   HPI:  Melissa Richards is a 52 year old G2P2 initially seen at the request of Dr Carlena Bjornstad for a large left ovarian mass and elevated CA 125 on November 1st, 2016.  She reported a 2 month history of increasing left sided abdominal discomfort and constipation and presented to the [facility] ER on 07/17/15 with symptoms of pelvic swelling, left lower back and abdominal pain.  A CT of the abdomen and pelvis on 07/17/15 showed bibasilar nodularity, a subcarinal 1.4cm node suggestive of infrahilar adenopathy, multiple small retroperitoneal nodes (none pathologic by size criteria) including a 38mm aortocaval node and a 1.5cm gastrohepatic ligament node. There is a portacaval node measuring 2.5 x 3.7cm (image/series 21/2) and a 1.2cm right external iliac node.  There is soft tissue fullness extending superiorly off the right ovary (measuring 6x10cm) and a dominant left ovarian cystic mass  arising from the pelvis measuring 18 x 22.5cm.   A CA 125 was drawn on 07/23/15 and was elevated at 2782. (CEA was normal at 0.4).  Medical history significant for morbid obesity, OSA, asthma and non-cardiac shortness of breath. She underwent coronary catheterization 2 years ago for this, which was negative for blockage, and she did not require stenting, or antiplatelet therapy. She was referred to pulmonology who diagnosed her as having asthma. She is a diabetic.  A Chest Ct on 07/30/15 Revealed: 1. Bilateral small pulmonary nodules are concerning for pulmonary metastasis. 2. Mild mediastinal and bilateral hilar adenopathy is also concerning for metastatic disease. 3. Mildly enlarged LEFT supraclavicular lymph node is concerning for metastatic disease. This lymph node may be amenable to ultrasound-guided biopsy 4. Enlargement of the LEFT lobe of thyroid gland likely represents benign goiter.    Due to the findings of unresectable chest and upper abdominal disease, she was scheduled for neoadjuvant chemotherapy following histologic confirmation via biopsy.  Biopsy of the supraclavicular node on 08/05/15 revealed FOCAL CLUSTERS WITH GRANULOMAS, SEE COMMENT. LYMPHOID TISSUE PRESENT. NO MALIGNANT CELLS IDENTIFIED.  Paracentesis of the ascites and biopsy of the pelvic soft tissue mass on 08/18/15 revealed SEROUS TUMOR WITH FOCAL PROLIFERATION, with the ascites showing papillary fragments of serous tumor (at least LMP).  Due to these inconclusive findings for malignancy and a concern that this is a low malignant potential tumor which would not be responsive to chemotherapy, chemotherapy was cancelled and surgery was scheduled. If this disease is LMP, it is likely that the chest and upper abdominal adenopathy is non-malignant.  On 09/04/15, she underwent an exploratory laparotomy with  TAH, bilateral salpingo-oophorectomy, omentectomy radical tumor debulking for ovarian cancer. 22 modifier for extreme  intraperitoneal obesity making surgical exposure more complex and increasing duration of procedure by 1 hour by Dr. [name].  Final pathology revealed: 1. 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 - NON-INVASIVE IMPLANT ON FALLOPIAN TUBE SEROSA. 2. Adnexa - ovary +/- tube, neoplastic, right - SEROUS BORDERLINE TUMOR, 8 CM WITH OVARIAN SURFACE INVOLVEMENT. - NON-INVASIVE IMPLANT ON FALLOPIAN TUBE SEROSA. 3. Uterus and cervix - CERVIX: NABOTHIAN CYST. - ENDOMETRIUM: INACTIVE. NO EVIDENCE OF HYPERPLASIA OR CARCINOMA. - MYOMETRIUM: LEIOMYOMA. - UTERINE SEROSA AND ATTACHED SOFT TISSUE: NON-INVASIVE IMPLANTS OF SEROUS BORDERLINE TUMOR. 4. Omentum, resection for tumor - MICROSCOPIC NON-INVASIVE IMPLANT OF SEROUS BORDERLINE TUMOR. - FOCI OF INFLAMMATION AND HYPEREMIA. 5. Soft tissue mass, simple excision, sigmoid - SEROUS BORDERLINE TUMOR, 8 CM CONSISTENT WITH NON-INVASIVE IMPLANT.   At her postoperative evaluation she had staples removed and dehiscence of her wound. A wound vac was applied and managed as an outpatient. There was no cellulitis or infection.  The patient had application of a wound vac for approximately 3 months. The wound completely healed.   She had repeat imaging with CT chest on 02/23/16 and MR abdomen on 02/23/16. C CT of the chest showed stable supraclavicular and hilar lymphadenopathy consistent with a diagnosis of sarcoidosis and unlikely to be reflective of metastatic borderline tumor The MRI of the abdomen demonstrated stable porta hepatis nodes also consistent with likely sarcoidosis and not metastatic disease. However a new enhancing 1 cm nodule was identified in the right paracolic gutter that was slightly increased since the February 2017 MRI could not exclude recurrent tumor implant. There was no other evidence of peritoneal metastases or ascites.  Repeat MRI in August 2017 showed complete resolution of the  right paracolic gutter implant. CA 125 on 05/26/16 was stable and normal at 23.  Interval History:   CA 125 on 08/09/16 was stable at 29.  Stable weight. She has persistent episodes of right mid abdominal cramping pain that feel like pulled muscles coming in waves ("like labor pains"). "they come and go".   CA 125 on 02/19/17 was stable at 20.6. MRI abdomen on 05/18/17 showed no metastatic disease, stable lymphadenopathy consistent with chronic inflammatory process.  CA 125 on 09/19/17 was stable at 21.7.  CA 125 on 03/24/18 was stable at 22.   MRI abdomen on 04/03/18 showed stably enlarged retroperitoneal adenopathy in the abdomen and pelvis and chronic hilar adenopathy and scattered pulmonary nodules felt to be due to sarcoidosis.  They recommended follow-up CT scan of the abdomen pelvis with IV contrast in 6 months to evaluate the retroperitoneal adenopathy for stability.  The 1.3 cm right paracolic gutter nodule was stable and unchanged since 2018 and was presumed to be benign.  She is being worked up for possible cardiac sarcoidosis.   Review of Systems  Constitutional: Feels physically well  Denies fever, chills, early satiety, unintentional weight loss or gain.  Cardiovascular: No chest pain, shortness of breath, or edema.  Pulmonary: No cough or wheeze.  Gastrointestinal: No nausea, vomiting, or diarrhea. No bright red blood per rectum or change in bowel movement. + occasional abdominal pains (on right) Genitourinary: No frequency, urgency, or dysuria. No vaginal bleeding or discharge.  Musculoskeletal: No myalgia or joint pain. Neurologic: No weakness, numbness, or change in gait.  Psychology: + depression, feels sad and weepy and anxious  Current Meds:  Outpatient Encounter  Medications as of 09/08/2018  Medication Sig  . albuterol (PROVENTIL HFA;VENTOLIN HFA) 108 (90 BASE) MCG/ACT inhaler Inhale 1-2 puffs into the lungs every 6 (six) hours as needed for wheezing or shortness of  breath. Reported on 02/23/2016  . atenolol (TENORMIN) 25 MG tablet Take 25 mg by mouth daily.  Marland Kitchen atorvastatin (LIPITOR) 40 MG tablet Take 40 mg by mouth daily at 6 PM.   . budesonide-formoterol (SYMBICORT) 160-4.5 MCG/ACT inhaler Inhale 2 puffs into the lungs 2 (two) times daily. Reported on 02/23/2016  . busPIRone (BUSPAR) 15 MG tablet Take 15 mg by mouth 2 (two) times daily.  . cholecalciferol (VITAMIN D) 1000 units tablet Take 1,000 Units by mouth once a week.   . citalopram (CELEXA) 40 MG tablet Take 40 mg by mouth daily.  Marland Kitchen glipiZIDE (GLIPIZIDE XL) 5 MG 24 hr tablet Take 5 mg by mouth daily with breakfast.  . lisinopril (PRINIVIL,ZESTRIL) 20 MG tablet Take 20 mg by mouth daily.  . metFORMIN (GLUCOPHAGE) 500 MG tablet Take 500 mg by mouth daily with breakfast. Patient takes this medication twice a day.  . Multiple Vitamin (MULTIVITAMIN WITH MINERALS) TABS tablet Take 1 tablet by mouth daily.  . nitroGLYCERIN (NITROSTAT) 0.4 MG SL tablet Place 1 tablet (0.4 mg total) under the tongue every 5 (five) minutes as needed.  . traZODone (DESYREL) 50 MG tablet Take 50 mg by mouth at bedtime.   No facility-administered encounter medications on file as of 09/08/2018.     Allergy:  Allergies  Allergen Reactions  . Hydrocodone Itching    Can only take w benadryl    Social Hx:   Social History   Socioeconomic History  . Marital status: Married    Spouse name: Not on file  . Number of children: Not on file  . Years of education: Not on file  . Highest education level: Not on file  Occupational History  . Not on file  Social Needs  . Financial resource strain: Not on file  . Food insecurity:    Worry: Not on file    Inability: Not on file  . Transportation needs:    Medical: Not on file    Non-medical: Not on file  Tobacco Use  . Smoking status: Never Smoker  . Smokeless tobacco: Never Used  Substance and Sexual Activity  . Alcohol use: No  . Drug use: No  . Sexual activity: Not on  file  Lifestyle  . Physical activity:    Days per week: Not on file    Minutes per session: Not on file  . Stress: Not on file  Relationships  . Social connections:    Talks on phone: Not on file    Gets together: Not on file    Attends religious service: Not on file    Active member of club or organization: Not on file    Attends meetings of clubs or organizations: Not on file    Relationship status: Not on file  . Intimate partner violence:    Fear of current or ex partner: Not on file    Emotionally abused: Not on file    Physically abused: Not on file    Forced sexual activity: Not on file  Other Topics Concern  . Not on file  Social History Narrative  . Not on file    Past Surgical Hx:  Past Surgical History:  Procedure Laterality Date  . ABDOMINAL HYSTERECTOMY Bilateral 09/04/2015   Procedure: HYSTERECTOMY TOTAL ABDOMINAL, EXPLORATORY LAPAROTOMY, BILATERAL  SALPINGO OOPHERECTOMY, OMENTECTOMY, DEBULKING;  Surgeon: [name], MD;  Location: [facility];  Service: Gynecology;  Laterality: Bilateral;  . CARDIAC CATHETERIZATION     2 years ago  . TONSILLECTOMY    . TUBAL LIGATION      Past Medical Hx:  Past Medical History:  Diagnosis Date  . Adnexal mass    BILATERAL  . Anxiety   . Arthritis   . Asthma   . Constipation   . Depression   . Diabetes mellitus without complication ([place])   . Family history of adverse reaction to anesthesia    FAMILY MEMBERS ARE SLOW TO WAKE UP  . Hypertension   . Numbness and tingling    FEET /LEGS  . ovarian ca dx'd 07/2015  . Recurrent UTI   . Sleep apnea    DOES NOT USE C -PAP    Family Hx:  Family History  Problem Relation Age of Onset  . Anesthesia problems Mother   . Diabetes Mother   . Hypertension Mother   . Cancer Father   . Diabetes Brother     Vitals:  Blood pressure 138/83, pulse 76, temperature 98.2 F (36.8 C), temperature source Oral, resp. rate 20, height 5\' 2"  (1.575 m), weight 257 lb (116.6 kg), SpO2 100  %.  Physical Exam:  General: Well developed, well nourished female in no acute distress. Alert and oriented x 3.  Cardiovascular: Regular rate and rhythm. S1 and S2 normal.  Lungs: Clear to auscultation bilaterally. No wheezes/crackles/rhonchi noted.  Skin: No rashes or lesions present. Back: No CVA tenderness.  Genitourinary: vaginal cuff well healed, no bleeding or discharge.No pelvic masses. Speculum exam revealed no blood.  Rectal: no palpable masses or lesions. Abdomen: Abdomen soft, non-tender and morbidly obese. Wound completely healed. Extremities: No bilateral cyanosis, edema, or clubbing.    Melissa Solo, MD  09/08/2018, 3:53 PM

## 2018-09-08 NOTE — Patient Instructions (Addendum)
Please notify Dr Denman George at phone number [phone] if you notice vaginal bleeding, new pelvic or abdominal pains, bloating, feeling full easy, or a change in bladder or bowel function.   Dr Serita Grit office will call you with the blood results Monday.  She has scheduled you for a CT scan in January to look at the lymph nodes in the abdomen.  She will see you again for follow-up in June, 2020.

## 2018-09-09 LAB — CA 125: Cancer Antigen (CA) 125: 18.6 U/mL (ref 0.0–38.1)

## 2018-09-11 ENCOUNTER — Telehealth: Payer: Self-pay

## 2018-09-11 NOTE — Telephone Encounter (Signed)
Incoming call from pt regarding results of CA125, noted CA 125 on 09/08/2018 was stable at 18.6.  Pt voiced understanding and no other needs per pt at this time.

## 2018-09-25 ENCOUNTER — Ambulatory Visit (INDEPENDENT_AMBULATORY_CARE_PROVIDER_SITE_OTHER): Payer: BLUE CROSS/BLUE SHIELD

## 2018-09-25 DIAGNOSIS — R0789 Other chest pain: Secondary | ICD-10-CM | POA: Diagnosis not present

## 2018-09-25 NOTE — Progress Notes (Signed)
Complete echocardiogram has been performed.  Jimmy Torianna Junio, RDCS, RVT 

## 2018-09-26 ENCOUNTER — Telehealth: Payer: Self-pay

## 2018-09-26 NOTE — Telephone Encounter (Signed)
Patient called and notified of test results. 

## 2018-09-26 NOTE — Telephone Encounter (Signed)
-----   Message from Jenean Lindau, MD sent at 09/26/2018 12:10 PM EST ----- The results of the study is unremarkable. Please inform patient. I will discuss in detail at next appointment. Cc  primary care/referring physician Jenean Lindau, MD 09/26/2018 12:10 PM

## 2018-10-02 DIAGNOSIS — L0390 Cellulitis, unspecified: Secondary | ICD-10-CM | POA: Diagnosis not present

## 2018-10-12 ENCOUNTER — Telehealth (HOSPITAL_COMMUNITY): Payer: Self-pay | Admitting: *Deleted

## 2018-10-12 NOTE — Telephone Encounter (Signed)
Patient given detailed instructions per Myocardial Perfusion Study Information Sheet for the test on 10/17/18. Patient notified to arrive 15 minutes early and that it is imperative to arrive on time for appointment to keep from having the test rescheduled.  If you need to cancel or reschedule your appointment, please call the office within 24 hours of your appointment. . Patient verbalized understanding. Vear Staton, Kiarrah Jacqueline   

## 2018-10-13 ENCOUNTER — Inpatient Hospital Stay: Payer: BLUE CROSS/BLUE SHIELD | Attending: Gynecologic Oncology

## 2018-10-13 ENCOUNTER — Ambulatory Visit (HOSPITAL_COMMUNITY)
Admission: RE | Admit: 2018-10-13 | Discharge: 2018-10-13 | Disposition: A | Discharge status: Home / Self Care | Payer: BLUE CROSS/BLUE SHIELD | Source: Ambulatory Visit | Attending: Gynecologic Oncology | Admitting: Gynecologic Oncology

## 2018-10-13 DIAGNOSIS — D3910 Neoplasm of uncertain behavior of unspecified ovary: Secondary | ICD-10-CM | POA: Diagnosis not present

## 2018-10-13 DIAGNOSIS — Z90710 Acquired absence of both cervix and uterus: Secondary | ICD-10-CM | POA: Insufficient documentation

## 2018-10-13 DIAGNOSIS — K760 Fatty (change of) liver, not elsewhere classified: Secondary | ICD-10-CM | POA: Diagnosis not present

## 2018-10-13 LAB — BASIC METABOLIC PANEL - CANCER CENTER ONLY
ANION GAP: 9 (ref 5–15)
BUN: 12 mg/dL (ref 6–20)
CHLORIDE: 104 mmol/L (ref 98–111)
CO2: 27 mmol/L (ref 22–32)
Calcium: 9.8 mg/dL (ref 8.9–10.3)
Creatinine: 0.96 mg/dL (ref 0.44–1.00)
GFR, Estimated: >60 mL/min (ref >60)
GLUCOSE: 116 mg/dL — AB (ref 70–99)
Potassium: 4.2 mmol/L (ref 3.5–5.1)
Sodium: 140 mmol/L (ref 135–145)

## 2018-10-13 MED ORDER — IOHEXOL 300 MG/ML  SOLN
100.0000 mL | Freq: Once | INTRAMUSCULAR | Status: AC | PRN
  Administered 2018-10-13: 100 mL via INTRAVENOUS

## 2018-10-17 ENCOUNTER — Ambulatory Visit: Payer: BLUE CROSS/BLUE SHIELD

## 2018-10-17 ENCOUNTER — Telehealth: Payer: Self-pay

## 2018-10-17 NOTE — Telephone Encounter (Signed)
Told Melissa Richards that the CT Scan was stable No progressive or new findings per [name], NP. Keep appointment with [name] on 03-16-2019 as scheduled. Pt verbalized understanding.

## 2018-10-18 ENCOUNTER — Ambulatory Visit: Payer: BLUE CROSS/BLUE SHIELD

## 2018-10-19 DIAGNOSIS — E785 Hyperlipidemia, unspecified: Secondary | ICD-10-CM | POA: Diagnosis not present

## 2018-10-19 DIAGNOSIS — E119 Type 2 diabetes mellitus without complications: Secondary | ICD-10-CM | POA: Diagnosis not present

## 2018-10-19 DIAGNOSIS — F419 Anxiety disorder, unspecified: Secondary | ICD-10-CM | POA: Diagnosis not present

## 2018-10-19 DIAGNOSIS — Z0000 Encounter for general adult medical examination without abnormal findings: Secondary | ICD-10-CM | POA: Diagnosis not present

## 2019-02-12 DIAGNOSIS — F331 Major depressive disorder, recurrent, moderate: Secondary | ICD-10-CM | POA: Diagnosis not present

## 2019-02-19 DIAGNOSIS — F331 Major depressive disorder, recurrent, moderate: Secondary | ICD-10-CM | POA: Diagnosis not present

## 2019-03-09 ENCOUNTER — Other Ambulatory Visit: Payer: Self-pay | Admitting: Gynecologic Oncology

## 2019-03-09 ENCOUNTER — Telehealth: Payer: Self-pay | Admitting: *Deleted

## 2019-03-09 DIAGNOSIS — D3910 Neoplasm of uncertain behavior of unspecified ovary: Secondary | ICD-10-CM

## 2019-03-09 NOTE — Telephone Encounter (Signed)
Patient called to confirm her appt. Also scheduled the patient for a lab. Nees orders CA-125

## 2019-03-15 ENCOUNTER — Telehealth: Payer: Self-pay | Admitting: *Deleted

## 2019-03-15 NOTE — Telephone Encounter (Signed)
Returned the patient, reschedule the appts from today to 8/22

## 2019-03-16 ENCOUNTER — Inpatient Hospital Stay: Payer: BC Managed Care – PPO

## 2019-03-16 ENCOUNTER — Inpatient Hospital Stay: Payer: BC Managed Care – PPO | Admitting: Gynecologic Oncology

## 2019-04-04 ENCOUNTER — Inpatient Hospital Stay (HOSPITAL_BASED_OUTPATIENT_CLINIC_OR_DEPARTMENT_OTHER): Payer: BC Managed Care – PPO | Admitting: Gynecologic Oncology

## 2019-04-04 ENCOUNTER — Encounter: Payer: Self-pay | Admitting: Gynecologic Oncology

## 2019-04-04 ENCOUNTER — Inpatient Hospital Stay: Payer: BC Managed Care – PPO | Attending: Gynecologic Oncology

## 2019-04-04 ENCOUNTER — Other Ambulatory Visit: Payer: Self-pay

## 2019-04-04 VITALS — BP 123/85 | HR 76 | Temp 98.7°F | Resp 18 | Ht 62.0 in | Wt 260.0 lb

## 2019-04-04 DIAGNOSIS — G4733 Obstructive sleep apnea (adult) (pediatric): Secondary | ICD-10-CM | POA: Insufficient documentation

## 2019-04-04 DIAGNOSIS — Z7984 Long term (current) use of oral hypoglycemic drugs: Secondary | ICD-10-CM | POA: Diagnosis not present

## 2019-04-04 DIAGNOSIS — F329 Major depressive disorder, single episode, unspecified: Secondary | ICD-10-CM | POA: Diagnosis not present

## 2019-04-04 DIAGNOSIS — R918 Other nonspecific abnormal finding of lung field: Secondary | ICD-10-CM | POA: Insufficient documentation

## 2019-04-04 DIAGNOSIS — E119 Type 2 diabetes mellitus without complications: Secondary | ICD-10-CM | POA: Diagnosis not present

## 2019-04-04 DIAGNOSIS — C569 Malignant neoplasm of unspecified ovary: Secondary | ICD-10-CM | POA: Diagnosis not present

## 2019-04-04 DIAGNOSIS — Z90710 Acquired absence of both cervix and uterus: Secondary | ICD-10-CM | POA: Insufficient documentation

## 2019-04-04 DIAGNOSIS — D3910 Neoplasm of uncertain behavior of unspecified ovary: Secondary | ICD-10-CM

## 2019-04-04 DIAGNOSIS — I10 Essential (primary) hypertension: Secondary | ICD-10-CM | POA: Insufficient documentation

## 2019-04-04 DIAGNOSIS — Z79899 Other long term (current) drug therapy: Secondary | ICD-10-CM | POA: Diagnosis not present

## 2019-04-04 DIAGNOSIS — Z90722 Acquired absence of ovaries, bilateral: Secondary | ICD-10-CM

## 2019-04-04 DIAGNOSIS — J45909 Unspecified asthma, uncomplicated: Secondary | ICD-10-CM | POA: Insufficient documentation

## 2019-04-04 DIAGNOSIS — Z7951 Long term (current) use of inhaled steroids: Secondary | ICD-10-CM | POA: Diagnosis not present

## 2019-04-04 NOTE — Progress Notes (Signed)
Follow Up Note: Gyn-Onc  Melissa Richards 53 y.o. female  CC:  Chief Complaint  Patient presents with  . 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   1/ Stage IIIC serous LMP of ovary: No evidence of recurrence on August 2018 MRI. Will follow with CA 125's and annual MRI (next due August 2019). If CA 125 elevated or doubled, will order MRI now. 2/ depression: to continue treatment with her psychologist. 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 Will not continue to follow these sites with reimaging.   Follow-up in 6 months (June, 2020) with exam and CA 125.   HPI:  Melissa Richards is a 53 year old G2P2 initially seen at the request of Dr Melissa Richards for a large left ovarian mass and elevated CA 125 on November 1st, 2016.  She reported a 2 month history of increasing left sided abdominal discomfort and constipation and presented to [facility] on 07/17/15 with symptoms of pelvic swelling, left lower back and abdominal pain.  A CT of the abdomen and pelvis on 07/17/15 showed bibasilar nodularity, a subcarinal 1.4cm node suggestive of infrahilar adenopathy, multiple small retroperitoneal nodes (none pathologic by size criteria) including a 57mm aortocaval node and a 1.5cm gastrohepatic ligament node. There is a portacaval node measuring 2.5 x 3.7cm (image/series 21/2) and a 1.2cm right external iliac node.  There is soft tissue fullness extending superiorly off the right ovary (measuring 6x10cm) and a dominant left ovarian cystic mass arising from the pelvis measuring 18 x 22.5cm.   A CA 125  was drawn on 07/23/15 and was elevated at 2782. (CEA was normal at 0.4).  Medical history significant for morbid obesity, OSA, asthma and non-cardiac shortness of breath. She underwent coronary catheterization 2 years ago for this, which was negative for blockage, and she did not require stenting, or antiplatelet therapy. She was referred to pulmonology who diagnosed her as having asthma. She is a diabetic.  A Chest Ct on 07/30/15 Revealed: 1. Bilateral small pulmonary nodules are concerning for pulmonary metastasis. 2. Mild mediastinal and bilateral hilar adenopathy is also concerning for metastatic disease. 3. Mildly enlarged LEFT supraclavicular lymph node is concerning for metastatic disease. This lymph node may be amenable to ultrasound-guided biopsy 4. Enlargement of the LEFT lobe of thyroid gland likely represents benign goiter.    Due to the findings of unresectable chest and upper abdominal disease, she was scheduled for neoadjuvant chemotherapy following histologic confirmation via biopsy.  Biopsy of the supraclavicular node on 08/05/15 revealed FOCAL CLUSTERS WITH GRANULOMAS, SEE COMMENT. LYMPHOID TISSUE PRESENT. NO MALIGNANT CELLS IDENTIFIED.  Paracentesis of the ascites and biopsy of the pelvic soft tissue mass on 08/18/15 revealed SEROUS TUMOR WITH FOCAL PROLIFERATION, with the ascites showing papillary fragments of serous tumor (at least LMP).  Due to these inconclusive findings for malignancy and a concern that this is a low malignant potential tumor which would not be responsive to chemotherapy, chemotherapy was cancelled and surgery was scheduled. If this disease is LMP, it is likely that the chest and upper abdominal adenopathy is non-malignant.  On 09/04/15, she underwent an exploratory laparotomy with TAH, bilateral salpingo-oophorectomy, omentectomy radical tumor debulking  for ovarian cancer. 22 modifier for extreme intraperitoneal obesity making surgical exposure more complex and  increasing duration of procedure by 1 hour by Dr. Everitt Richards.  Final pathology revealed: 1. 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 - NON-INVASIVE IMPLANT ON FALLOPIAN TUBE SEROSA. 2. Adnexa - ovary +/- tube, neoplastic, right - SEROUS BORDERLINE TUMOR, 8 CM WITH OVARIAN SURFACE INVOLVEMENT. - NON-INVASIVE IMPLANT ON FALLOPIAN TUBE SEROSA. 3. Uterus and cervix - CERVIX: NABOTHIAN CYST. - ENDOMETRIUM: INACTIVE. NO EVIDENCE OF HYPERPLASIA OR CARCINOMA. - MYOMETRIUM: LEIOMYOMA. - UTERINE SEROSA AND ATTACHED SOFT TISSUE: NON-INVASIVE IMPLANTS OF SEROUS BORDERLINE TUMOR. 4. Omentum, resection for tumor - MICROSCOPIC NON-INVASIVE IMPLANT OF SEROUS BORDERLINE TUMOR. - FOCI OF INFLAMMATION AND HYPEREMIA. 5. Soft tissue mass, simple excision, sigmoid - SEROUS BORDERLINE TUMOR, 8 CM CONSISTENT WITH NON-INVASIVE IMPLANT.   At her postoperative evaluation she had staples removed and dehiscence of her wound. A wound vac was applied and managed as an outpatient. There was no cellulitis or infection.  The patient had application of a wound vac for approximately 3 months. The wound completely healed.   She had repeat imaging with CT chest on 02/23/16 and MR abdomen on 02/23/16. C CT of the chest showed stable supraclavicular and hilar lymphadenopathy consistent with a diagnosis of sarcoidosis and unlikely to be reflective of metastatic borderline tumor The MRI of the abdomen demonstrated stable porta hepatis nodes also consistent with likely sarcoidosis and not metastatic disease. However a new enhancing 1 cm nodule was identified in the right paracolic gutter that was slightly increased since the February 2017 MRI could not exclude recurrent tumor implant. There was no other evidence of peritoneal metastases or ascites.  Repeat MRI in August 2017 showed complete resolution of the right paracolic gutter implant. CA 125 on 05/26/16 was stable and  normal at 23.  CA 125 on 08/09/16 was stable at 29.  Stable weight. She has persistent episodes of right mid abdominal cramping pain that feel like pulled muscles coming in waves ("like labor pains"). "they come and go".   CA 125 on 02/19/17 was stable at 20.6. MRI abdomen on 05/18/17 showed no metastatic disease, stable lymphadenopathy consistent with chronic inflammatory process.  CA 125 on 09/19/17 was stable at 21.7.  CA 125 on 03/24/18 was stable at 22.   MRI abdomen on 04/03/18 showed stably enlarged retroperitoneal adenopathy in the abdomen and pelvis and chronic hilar adenopathy and scattered pulmonary nodules felt to be due to sarcoidosis.  They recommended follow-up CT scan of the abdomen pelvis with IV contrast in 6 months to evaluate the retroperitoneal adenopathy for stability.  The 1.3 cm right paracolic gutter nodule was stable and unchanged since 2018 and was presumed to be benign.  CA 125 on 09/08/18 was normal at 18.6.   Interval History:    She is being worked up for possible cardiac sarcoidosis but this is an incomplete work-up due to her anxiety with proceeding with testing. She had an echo in January which she states was "good".  She had a CT abd/pelvis on 10/13/18 a stable exam with no new or progressive findings.  There is a stable appearance of multiple bilateral pulmonary nodules.  Hepatic steatosis was again appreciated.  Aortic atherosclerosis was seen.  There were no abdominal or pelvic nodules or carcinomatosis seen.   Review of Systems  Constitutional: Feels physically well  Denies fever, chills, early satiety, unintentional weight loss or gain.  Cardiovascular: No  chest pain, shortness of breath, or edema.  Pulmonary: No cough or wheeze.  Gastrointestinal: No nausea, vomiting, or diarrhea. No bright red blood per rectum or change in bowel movement. Genitourinary: No frequency, urgency, or dysuria. No vaginal bleeding or discharge.  Musculoskeletal: No myalgia or  joint pain. Neurologic: No weakness, numbness, or change in gait.  Psychology: + depression.  Current Meds:  Outpatient Encounter Medications as of 04/04/2019  Medication Sig  . albuterol (PROVENTIL HFA;VENTOLIN HFA) 108 (90 BASE) MCG/ACT inhaler Inhale 1-2 puffs into the lungs every 6 (six) hours as needed for wheezing or shortness of breath. Reported on 02/23/2016  . atenolol (TENORMIN) 25 MG tablet Take 25 mg by mouth daily.  Marland Kitchen atorvastatin (LIPITOR) 40 MG tablet Take 40 mg by mouth daily at 6 PM.   . budesonide-formoterol (SYMBICORT) 160-4.5 MCG/ACT inhaler Inhale 2 puffs into the lungs 2 (two) times daily. Reported on 02/23/2016  . busPIRone (BUSPAR) 15 MG tablet Take 15 mg by mouth 2 (two) times daily.  . cholecalciferol (VITAMIN D) 1000 units tablet Take 1,000 Units by mouth once a week.   . citalopram (CELEXA) 40 MG tablet Take 40 mg by mouth daily.  Marland Kitchen glipiZIDE (GLIPIZIDE XL) 5 MG 24 hr tablet Take 5 mg by mouth daily with breakfast.  . lisinopril (PRINIVIL,ZESTRIL) 20 MG tablet Take 20 mg by mouth daily.  . metFORMIN (GLUCOPHAGE) 500 MG tablet Take 500 mg by mouth daily with breakfast. Patient takes this medication twice a day.  . Multiple Vitamin (MULTIVITAMIN WITH MINERALS) TABS tablet Take 1 tablet by mouth daily.  . nitroGLYCERIN (NITROSTAT) 0.4 MG SL tablet Place 1 tablet (0.4 mg total) under the tongue every 5 (five) minutes as needed.  . traZODone (DESYREL) 50 MG tablet Take 50 mg by mouth at bedtime.   No facility-administered encounter medications on file as of 04/04/2019.     Allergy:  Allergies  Allergen Reactions  . Hydrocodone Itching    Can only take w benadryl    Social Hx:   Social History   Socioeconomic History  . Marital status: Married    Spouse name: Not on file  . Number of children: Not on file  . Years of education: Not on file  . Highest education level: Not on file  Occupational History  . Not on file  Social Needs  . Financial resource strain:  Not on file  . Food insecurity    Worry: Not on file    Inability: Not on file  . Transportation needs    Medical: Not on file    Non-medical: Not on file  Tobacco Use  . Smoking status: Never Smoker  . Smokeless tobacco: Never Used  Substance and Sexual Activity  . Alcohol use: No  . Drug use: No  . Sexual activity: Not on file  Lifestyle  . Physical activity    Days per week: Not on file    Minutes per session: Not on file  . Stress: Not on file  Relationships  . Social Herbalist on phone: Not on file    Gets together: Not on file    Attends religious service: Not on file    Active member of club or organization: Not on file    Attends meetings of clubs or organizations: Not on file    Relationship status: Not on file  . Intimate partner violence    Fear of current or ex partner: Not on file    Emotionally abused:  Not on file    Physically abused: Not on file    Forced sexual activity: Not on file  Other Topics Concern  . Not on file  Social History Narrative  . Not on file    Past Surgical Hx:  Past Surgical History:  Procedure Laterality Date  . ABDOMINAL HYSTERECTOMY Bilateral 09/04/2015   Procedure: HYSTERECTOMY TOTAL ABDOMINAL, EXPLORATORY LAPAROTOMY, BILATERAL SALPINGO OOPHERECTOMY, OMENTECTOMY, DEBULKING;  Surgeon: Melissa Amber, MD;  Location: [facility];  Service: Gynecology;  Laterality: Bilateral;  . CARDIAC CATHETERIZATION     2 years ago  . TONSILLECTOMY    . TUBAL LIGATION      Past Medical Hx:  Past Medical History:  Diagnosis Date  . Adnexal mass    BILATERAL  . Anxiety   . Arthritis   . Asthma   . Constipation   . Depression   . Diabetes mellitus without complication ([place])   . Family history of adverse reaction to anesthesia    FAMILY MEMBERS ARE SLOW TO WAKE UP  . Hypertension   . Numbness and tingling    FEET /LEGS  . ovarian ca dx'd 07/2015  . Recurrent UTI   . Sleep apnea    DOES NOT USE C -PAP    Family Hx:  Family  History  Problem Relation Age of Onset  . Anesthesia problems Mother   . Diabetes Mother   . Hypertension Mother   . Cancer Father   . Diabetes Brother     Vitals:  Blood pressure 123/85, pulse 76, temperature 98.7 F (37.1 C), temperature source Oral, resp. rate 18, height 5\' 2"  (1.575 m), weight 260 lb (117.9 kg), SpO2 98 %.  Physical Exam:  General: Well developed, well nourished female in no acute distress. Alert and oriented x 3.  Cardiovascular: Regular rate and rhythm. S1 and S2 normal.  Lungs: Clear to auscultation bilaterally. No wheezes/crackles/rhonchi noted.  Skin: No rashes or lesions present. Back: No CVA tenderness.  Genitourinary: vaginal cuff well healed, no bleeding or discharge.No pelvic masses. Speculum exam revealed no blood.  Rectal: no palpable masses or lesions. Abdomen: Abdomen soft, non-tender and morbidly obese. Wound completely healed. Extremities: No bilateral cyanosis, edema, or clubbing.    Melissa Solo, MD  04/04/2019, 3:33 PM

## 2019-04-04 NOTE — Patient Instructions (Signed)
Please notify Dr Denman George at phone number [phone] if you notice vaginal bleeding, new pelvic or abdominal pains, bloating, feeling full easy, or a change in bladder or bowel function.   Please contact Dr Serita Grit office (at [phone]) in September, 2020 to request an appointment with her for January, 2021. Please ask them to make a lab appointment for the same day.

## 2019-04-05 ENCOUNTER — Telehealth: Payer: Self-pay

## 2019-04-05 LAB — CA 125: Cancer Antigen (CA) 125: 18.7 U/mL (ref 0.0–38.1)

## 2019-04-05 NOTE — Telephone Encounter (Signed)
-----   Message from Dorothyann Gibbs, NP sent at 04/05/2019  9:51 AM EDT ----- Please let her know. thanks ----- Message ----- From: Buel Ream, Lab In [place] Sent: 04/05/2019   8:37 AM EDT To: Dorothyann Gibbs, NP

## 2019-04-05 NOTE — Telephone Encounter (Signed)
Told Melissa Richards that her CA-125 was good at 18.7 per [name], NP

## 2019-04-25 DIAGNOSIS — F419 Anxiety disorder, unspecified: Secondary | ICD-10-CM | POA: Diagnosis not present

## 2019-04-25 DIAGNOSIS — Z6841 Body Mass Index (BMI) 40.0 and over, adult: Secondary | ICD-10-CM | POA: Diagnosis not present

## 2019-04-25 DIAGNOSIS — F329 Major depressive disorder, single episode, unspecified: Secondary | ICD-10-CM | POA: Diagnosis not present

## 2019-04-25 DIAGNOSIS — I10 Essential (primary) hypertension: Secondary | ICD-10-CM | POA: Diagnosis not present

## 2019-05-25 DIAGNOSIS — Z1231 Encounter for screening mammogram for malignant neoplasm of breast: Secondary | ICD-10-CM | POA: Diagnosis not present

## 2019-09-20 ENCOUNTER — Inpatient Hospital Stay: Payer: BC Managed Care – PPO

## 2019-09-20 ENCOUNTER — Other Ambulatory Visit: Payer: Self-pay

## 2019-09-20 ENCOUNTER — Inpatient Hospital Stay: Payer: BC Managed Care – PPO | Attending: Gynecologic Oncology | Admitting: Gynecologic Oncology

## 2019-09-20 ENCOUNTER — Encounter: Payer: Self-pay | Admitting: Gynecologic Oncology

## 2019-09-20 VITALS — BP 121/79 | HR 83 | Temp 98.2°F | Resp 18 | Ht 62.0 in | Wt 257.0 lb

## 2019-09-20 DIAGNOSIS — R918 Other nonspecific abnormal finding of lung field: Secondary | ICD-10-CM | POA: Insufficient documentation

## 2019-09-20 DIAGNOSIS — D869 Sarcoidosis, unspecified: Secondary | ICD-10-CM

## 2019-09-20 DIAGNOSIS — Z90722 Acquired absence of ovaries, bilateral: Secondary | ICD-10-CM | POA: Insufficient documentation

## 2019-09-20 DIAGNOSIS — D3910 Neoplasm of uncertain behavior of unspecified ovary: Secondary | ICD-10-CM | POA: Diagnosis not present

## 2019-09-20 DIAGNOSIS — F329 Major depressive disorder, single episode, unspecified: Secondary | ICD-10-CM | POA: Diagnosis not present

## 2019-09-20 DIAGNOSIS — Z90710 Acquired absence of both cervix and uterus: Secondary | ICD-10-CM | POA: Insufficient documentation

## 2019-09-20 LAB — BASIC METABOLIC PANEL
Anion gap: 14 (ref 5–15)
BUN: 13 mg/dL (ref 6–20)
CO2: 24 mmol/L (ref 22–32)
Calcium: 9.6 mg/dL (ref 8.9–10.3)
Chloride: 99 mmol/L (ref 98–111)
Creatinine, Ser: 1.04 mg/dL — ABNORMAL HIGH (ref 0.44–1.00)
GFR calc Af Amer: >60 mL/min (ref >60)
GFR calc non Af Amer: >60 mL/min (ref >60)
Glucose, Bld: 165 mg/dL — ABNORMAL HIGH (ref 70–99)
Potassium: 4.2 mmol/L (ref 3.5–5.1)
Sodium: 137 mmol/L (ref 135–145)

## 2019-09-20 NOTE — Patient Instructions (Addendum)
Dr Denman George recommends that you discuss with your PCP seeing a new psychiatrist and a rheumatologist (to follow your sarcoidosis).  She has ordered a tumor marker and CT scan to monitor for recurrence.  She will see you back in 6 months for a repeat tumor marker and physical examination.

## 2019-09-20 NOTE — Progress Notes (Signed)
Follow Up Note: Gyn-Onc  [name] 53 y.o. female  CC:  Chief Complaint  Patient presents with  . 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   1/ Stage IIIC serous LMP of ovary: No evidence of recurrence on August 2018 MRI. Will follow with CA 125's and annual MRI.. If CA 125 elevated or doubled, will order MRI now. CT scan of abd/pelvis and chest to monitor sarcoidosis nodules 2/ depression: to continue treatment with her psychologist. 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  Follow-up in 6 months (June, 2021) with exam and CA 125.    HPI:  Melissa Richards is a 53 year old G2P2 initially seen at the request of Dr Carlena Bjornstad for a large left ovarian mass and elevated CA 125 on November 1st, 2016.  She reported a 2 month history of increasing left sided abdominal discomfort and constipation and presented to [facility] ER on 07/17/15 with symptoms of pelvic swelling, left lower back and abdominal pain.  A CT of the abdomen and pelvis on 07/17/15 showed bibasilar nodularity, a subcarinal 1.4cm node suggestive of infrahilar adenopathy, multiple small retroperitoneal nodes (none pathologic by size criteria) including a 8mm aortocaval node and a 1.5cm gastrohepatic ligament node. There is a portacaval node measuring 2.5 x 3.7cm (image/series 21/2) and a 1.2cm right external iliac node.  There is soft tissue fullness extending superiorly off the right ovary (measuring 6x10cm) and a dominant left ovarian cystic mass arising from the pelvis measuring 18 x 22.5cm.   A CA 125 was  drawn on 07/23/15 and was elevated at 2782. (CEA was normal at 0.4).  Medical history significant for morbid obesity, OSA, asthma and non-cardiac shortness of breath. She underwent coronary catheterization 2 years ago for this, which was negative for blockage, and she did not require stenting, or antiplatelet therapy. She was referred to pulmonology who diagnosed her as having asthma. She is a diabetic.  A Chest Ct on 07/30/15 Revealed: 1. Bilateral small pulmonary nodules are concerning for pulmonary metastasis. 2. Mild mediastinal and bilateral hilar adenopathy is also concerning for metastatic disease. 3. Mildly enlarged LEFT supraclavicular lymph node is concerning for metastatic disease. This lymph node may be amenable to ultrasound-guided biopsy 4. Enlargement of the LEFT lobe of thyroid gland likely represents benign goiter.    Due to the findings of unresectable chest and upper abdominal disease, she was scheduled for neoadjuvant chemotherapy following histologic confirmation via biopsy.  Biopsy of the supraclavicular node on 08/05/15 revealed FOCAL CLUSTERS WITH GRANULOMAS, SEE COMMENT. LYMPHOID TISSUE PRESENT. NO MALIGNANT CELLS IDENTIFIED.  Paracentesis of the ascites and biopsy of the pelvic soft tissue mass on 08/18/15 revealed SEROUS TUMOR WITH FOCAL PROLIFERATION, with the ascites showing papillary fragments of serous tumor (at least LMP).  Due to these inconclusive findings for malignancy and a concern that this is a low malignant potential tumor which would not be responsive to chemotherapy, chemotherapy was cancelled and surgery was scheduled. If this disease is LMP, it is likely that the chest and upper abdominal adenopathy is non-malignant.  On 09/04/15, she underwent an exploratory laparotomy with TAH, bilateral salpingo-oophorectomy, omentectomy radical tumor debulking for ovarian cancer. [place]  modifier for extreme intraperitoneal obesity making surgical exposure more complex and increasing  duration of procedure by 1 hour by Dr. [name].  Final pathology revealed: 1. 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 - NON-INVASIVE IMPLANT ON FALLOPIAN TUBE SEROSA. 2. Adnexa - ovary +/- tube, neoplastic, right - SEROUS BORDERLINE TUMOR, 8 CM WITH OVARIAN SURFACE INVOLVEMENT. - NON-INVASIVE IMPLANT ON FALLOPIAN TUBE SEROSA. 3. Uterus and cervix - CERVIX: NABOTHIAN CYST. - ENDOMETRIUM: INACTIVE. NO EVIDENCE OF HYPERPLASIA OR CARCINOMA. - MYOMETRIUM: LEIOMYOMA. - UTERINE SEROSA AND ATTACHED SOFT TISSUE: NON-INVASIVE IMPLANTS OF SEROUS BORDERLINE TUMOR. 4. Omentum, resection for tumor - MICROSCOPIC NON-INVASIVE IMPLANT OF SEROUS BORDERLINE TUMOR. - FOCI OF INFLAMMATION AND HYPEREMIA. 5. Soft tissue mass, simple excision, sigmoid - SEROUS BORDERLINE TUMOR, 8 CM CONSISTENT WITH NON-INVASIVE IMPLANT.   At her postoperative evaluation she had staples removed and dehiscence of her wound. A wound vac was applied and managed as an outpatient. There was no cellulitis or infection.  The patient had application of a wound vac for approximately 3 months. The wound completely healed.   She had repeat imaging with CT chest on 02/23/16 and MR abdomen on 02/23/16. C CT of the chest showed stable supraclavicular and hilar lymphadenopathy consistent with a diagnosis of sarcoidosis and unlikely to be reflective of metastatic borderline tumor The MRI of the abdomen demonstrated stable porta hepatis nodes also consistent with likely sarcoidosis and not metastatic disease. However a new enhancing 1 cm nodule was identified in the right paracolic gutter that was slightly increased since the February 2017 MRI could not exclude recurrent tumor implant. There was no other evidence of peritoneal metastases or ascites.  Repeat MRI in August 2017 showed complete resolution of the right paracolic gutter implant. CA 125 on 05/26/16 was stable and normal at  23.  CA 125 on 08/09/16 was stable at 29.  Stable weight. She has persistent episodes of right mid abdominal cramping pain that feel like pulled muscles coming in waves ("like labor pains"). "they come and go".   CA 125 on 02/19/17 was stable at 20.6. MRI abdomen on 05/18/17 showed no metastatic disease, stable lymphadenopathy consistent with chronic inflammatory process.  CA 125 on 09/19/17 was stable at 21.7.  CA 125 on 03/24/18 was stable at 22.   MRI abdomen on 04/03/18 showed stably enlarged retroperitoneal adenopathy in the abdomen and pelvis and chronic hilar adenopathy and scattered pulmonary nodules felt to be due to sarcoidosis.  They recommended follow-up CT scan of the abdomen pelvis with IV contrast in 6 months to evaluate the retroperitoneal adenopathy for stability.  The 1.3 cm right paracolic gutter nodule was stable and unchanged since 2018 and was presumed to be benign.  CA 125 on 09/08/18 was normal at 18.6.   Interval History:    She is being worked up for possible cardiac sarcoidosis but this is an incomplete work-up due to her anxiety with proceeding with testing. She had an echo in January which she states was "good".  She had a CT abd/pelvis on 10/13/18 a stable exam with no new or progressive findings.  There is a stable appearance of multiple bilateral pulmonary nodules.  Hepatic steatosis was again appreciated.  Aortic atherosclerosis was seen.  There were no abdominal or pelvic nodules or carcinomatosis seen.   Review of Systems  Constitutional: Feels physically well  Denies fever, chills, early satiety, unintentional weight loss or gain.  Cardiovascular: No chest pain, shortness of  breath, or edema.  Pulmonary: No cough or wheeze.  Gastrointestinal: No nausea, vomiting, or diarrhea. No bright red blood per rectum or change in bowel movement. Genitourinary: No frequency, urgency, or dysuria. No vaginal bleeding or discharge.  Musculoskeletal: No myalgia or joint  pain. Neurologic: No weakness, numbness, or change in gait.  Psychology: + depression.  Current Meds:  Outpatient Encounter Medications as of 09/20/2019  Medication Sig  . albuterol (PROVENTIL HFA;VENTOLIN HFA) 108 (90 BASE) MCG/ACT inhaler Inhale 1-2 puffs into the lungs every 6 (six) hours as needed for wheezing or shortness of breath. Reported on 02/23/2016  . atenolol (TENORMIN) 25 MG tablet Take 25 mg by mouth daily.  Marland Kitchen atorvastatin (LIPITOR) 40 MG tablet Take 40 mg by mouth daily at 6 PM.   . budesonide-formoterol (SYMBICORT) 160-4.5 MCG/ACT inhaler Inhale 2 puffs into the lungs 2 (two) times daily. Reported on 02/23/2016  . busPIRone (BUSPAR) 15 MG tablet Take 15 mg by mouth 2 (two) times daily.  . cholecalciferol (VITAMIN D) 1000 units tablet Take 1,000 Units by mouth once a week.   . citalopram (CELEXA) 40 MG tablet Take 40 mg by mouth daily.  Marland Kitchen glipiZIDE (GLIPIZIDE XL) 5 MG 24 hr tablet Take 5 mg by mouth daily with breakfast.  . lisinopril (PRINIVIL,ZESTRIL) 20 MG tablet Take 20 mg by mouth daily.  . metFORMIN (GLUCOPHAGE) 500 MG tablet Take 500 mg by mouth daily with breakfast. Patient takes this medication twice a day.  . Multiple Vitamin (MULTIVITAMIN WITH MINERALS) TABS tablet Take 1 tablet by mouth daily.  . nitroGLYCERIN (NITROSTAT) 0.4 MG SL tablet Place 1 tablet (0.4 mg total) under the tongue every 5 (five) minutes as needed.  . traZODone (DESYREL) 50 MG tablet Take 50 mg by mouth at bedtime.   No facility-administered encounter medications on file as of 09/20/2019.    Allergy:  Allergies  Allergen Reactions  . Hydrocodone Itching    Can only take w benadryl    Social Hx:   Social History   Socioeconomic History  . Marital status: Married    Spouse name: Not on file  . Number of children: Not on file  . Years of education: Not on file  . Highest education level: Not on file  Occupational History  . Not on file  Tobacco Use  . Smoking status: Never Smoker   . Smokeless tobacco: Never Used  Substance and Sexual Activity  . Alcohol use: No  . Drug use: No  . Sexual activity: Not on file  Other Topics Concern  . Not on file  Social History Narrative  . Not on file   Social Determinants of Health   Financial Resource Strain:   . Difficulty of Paying Living Expenses: Not on file  Food Insecurity:   . Worried About Charity fundraiser in the Last Year: Not on file  . Ran Out of Food in the Last Year: Not on file  Transportation Needs:   . Lack of Transportation (Medical): Not on file  . Lack of Transportation (Non-Medical): Not on file  Physical Activity:   . Days of Exercise per Week: Not on file  . Minutes of Exercise per Session: Not on file  Stress:   . Feeling of Stress : Not on file  Social Connections:   . Frequency of Communication with Friends and Family: Not on file  . Frequency of Social Gatherings with Friends and Family: Not on file  . Attends Religious Services: Not on file  .  Active Member of Clubs or Organizations: Not on file  . Attends Archivist Meetings: Not on file  . Marital Status: Not on file  Intimate Partner Violence:   . Fear of Current or Ex-Partner: Not on file  . Emotionally Abused: Not on file  . Physically Abused: Not on file  . Sexually Abused: Not on file    Past Surgical Hx:  Past Surgical History:  Procedure Laterality Date  . ABDOMINAL HYSTERECTOMY Bilateral 09/04/2015   Procedure: HYSTERECTOMY TOTAL ABDOMINAL, EXPLORATORY LAPAROTOMY, BILATERAL SALPINGO OOPHERECTOMY, OMENTECTOMY, DEBULKING;  Surgeon: [name], MD;  Location: [facility];  Service: Gynecology;  Laterality: Bilateral;  . CARDIAC CATHETERIZATION     2 years ago  . TONSILLECTOMY    . TUBAL LIGATION      Past Medical Hx:  Past Medical History:  Diagnosis Date  . Adnexal mass    BILATERAL  . Anxiety   . Arthritis   . Asthma   . Constipation   . Depression   . Diabetes mellitus without complication ([place])   .  Family history of adverse reaction to anesthesia    FAMILY MEMBERS ARE SLOW TO WAKE UP  . Hypertension   . Numbness and tingling    FEET /LEGS  . ovarian ca dx'd 07/2015  . Recurrent UTI   . Sleep apnea    DOES NOT USE C -PAP    Family Hx:  Family History  Problem Relation Age of Onset  . Anesthesia problems Mother   . Diabetes Mother   . Hypertension Mother   . Cancer Father   . Diabetes Brother     Vitals:  Blood pressure 121/79, pulse 83, temperature 98.2 F (36.8 C), temperature source Temporal, resp. rate 18, height 5\' 2"  (1.575 m), weight 257 lb (116.6 kg), SpO2 99 %.  Physical Exam:  General: Well developed, well nourished female in no acute distress. Alert and oriented x 3.  Cardiovascular: Regular rate and rhythm. S1 and S2 normal.  Lungs: Clear to auscultation bilaterally. No wheezes/crackles/rhonchi noted.  Skin: No rashes or lesions present. Back: No CVA tenderness.  Genitourinary: vaginal cuff well healed, no bleeding or discharge.No pelvic masses. Speculum exam revealed no blood.  Rectal: no palpable masses or lesions. Abdomen: Abdomen soft, non-tender and morbidly obese. Wound completely healed. Extremities: No bilateral cyanosis, edema, or clubbing.    Thereasa Solo, MD  09/20/2019, 12:18 PM

## 2019-09-21 ENCOUNTER — Telehealth: Payer: Self-pay

## 2019-09-21 LAB — CA 125: Cancer Antigen (CA) 125: 19.4 U/mL (ref 0.0–38.1)

## 2019-09-21 NOTE — Telephone Encounter (Signed)
I let Melissa Richards know her Ca 125 is 19.4, WNL and creatinine 1.04 slightly elevated.  She verbalized understanding.

## 2019-09-24 ENCOUNTER — Encounter: Payer: Self-pay | Admitting: Gastroenterology

## 2019-10-01 ENCOUNTER — Telehealth: Payer: Self-pay

## 2019-10-01 DIAGNOSIS — R509 Fever, unspecified: Secondary | ICD-10-CM | POA: Diagnosis not present

## 2019-10-01 DIAGNOSIS — R0981 Nasal congestion: Secondary | ICD-10-CM | POA: Diagnosis not present

## 2019-10-01 DIAGNOSIS — Z20828 Contact with and (suspected) exposure to other viral communicable diseases: Secondary | ICD-10-CM | POA: Diagnosis not present

## 2019-10-01 DIAGNOSIS — R05 Cough: Secondary | ICD-10-CM | POA: Diagnosis not present

## 2019-10-01 NOTE — Telephone Encounter (Signed)
Pt stated that she cancelled her CT scan for tomorrow 10-02-19 as she tested positive for Covid today. She will contact radiology to r/s once she gets better.

## 2019-10-02 ENCOUNTER — Ambulatory Visit (HOSPITAL_COMMUNITY): Payer: BC Managed Care – PPO

## 2019-10-08 ENCOUNTER — Ambulatory Visit (HOSPITAL_COMMUNITY): Payer: BC Managed Care – PPO

## 2019-10-31 DIAGNOSIS — D860 Sarcoidosis of lung: Secondary | ICD-10-CM | POA: Diagnosis not present

## 2019-10-31 DIAGNOSIS — J4530 Mild persistent asthma, uncomplicated: Secondary | ICD-10-CM | POA: Diagnosis not present

## 2019-10-31 DIAGNOSIS — J309 Allergic rhinitis, unspecified: Secondary | ICD-10-CM | POA: Diagnosis not present

## 2019-10-31 DIAGNOSIS — R5383 Other fatigue: Secondary | ICD-10-CM | POA: Diagnosis not present

## 2019-10-31 DIAGNOSIS — G4733 Obstructive sleep apnea (adult) (pediatric): Secondary | ICD-10-CM | POA: Diagnosis not present

## 2019-11-01 ENCOUNTER — Telehealth: Payer: Self-pay | Admitting: *Deleted

## 2019-11-01 NOTE — Telephone Encounter (Signed)
[facility], scheduled CT chest/abdomen/pelvis for 2/1at 1pm. Patient to arrive at 12 pm for labs. Fax orders and auth over.  Called and gave the patient the appt date/time and instructions. Patient requested that we faax request to Dr Alcide Clever her pulmonology doctor

## 2019-11-05 ENCOUNTER — Ambulatory Visit
Admission: RE | Admit: 2019-11-05 | Discharge: 2019-11-05 | Disposition: A | Discharge status: Home / Self Care | Payer: Self-pay | Source: Ambulatory Visit | Attending: Gynecologic Oncology | Admitting: Gynecologic Oncology

## 2019-11-05 ENCOUNTER — Other Ambulatory Visit: Payer: Self-pay

## 2019-11-05 ENCOUNTER — Telehealth: Payer: Self-pay

## 2019-11-05 ENCOUNTER — Encounter: Payer: Self-pay | Admitting: Gynecologic Oncology

## 2019-11-05 DIAGNOSIS — D869 Sarcoidosis, unspecified: Secondary | ICD-10-CM | POA: Diagnosis not present

## 2019-11-05 DIAGNOSIS — D3910 Neoplasm of uncertain behavior of unspecified ovary: Secondary | ICD-10-CM

## 2019-11-05 DIAGNOSIS — R918 Other nonspecific abnormal finding of lung field: Secondary | ICD-10-CM | POA: Diagnosis not present

## 2019-11-05 DIAGNOSIS — I700 Atherosclerosis of aorta: Secondary | ICD-10-CM | POA: Diagnosis not present

## 2019-11-05 DIAGNOSIS — R590 Localized enlarged lymph nodes: Secondary | ICD-10-CM | POA: Diagnosis not present

## 2019-11-05 DIAGNOSIS — N838 Other noninflammatory disorders of ovary, fallopian tube and broad ligament: Secondary | ICD-10-CM

## 2019-11-05 DIAGNOSIS — C569 Malignant neoplasm of unspecified ovary: Secondary | ICD-10-CM | POA: Diagnosis not present

## 2019-11-05 NOTE — Telephone Encounter (Signed)
LM for patient stating that Dr. Derek Jack will call her tomorrow to discuss the results of the CT scan done today 11-05-19

## 2019-11-06 NOTE — Telephone Encounter (Signed)
Told Melissa Richards that the scan showed no new or progressive interval findings to suggest recurrent/metatstatic disease. Scattered bilateral pulmonary nodules are stable. The the mediastinal lymph nodes, generally decreased in the interval with stable mild upper abdominal lymphadenopathy all lickly related to sarcoidosis. Small infraumbilical midline ventral hernias contain only fat. Aortic Atherosclerosis. Per Melissa Cross,NP. Pt verbalized understanding. Report to be faxed to patient's pulmonary doctor as requested.

## 2019-11-07 DIAGNOSIS — G4733 Obstructive sleep apnea (adult) (pediatric): Secondary | ICD-10-CM | POA: Diagnosis not present

## 2019-11-24 DIAGNOSIS — G4733 Obstructive sleep apnea (adult) (pediatric): Secondary | ICD-10-CM | POA: Diagnosis not present

## 2019-11-27 DIAGNOSIS — J309 Allergic rhinitis, unspecified: Secondary | ICD-10-CM | POA: Diagnosis not present

## 2019-11-27 DIAGNOSIS — D860 Sarcoidosis of lung: Secondary | ICD-10-CM | POA: Diagnosis not present

## 2019-11-27 DIAGNOSIS — J4530 Mild persistent asthma, uncomplicated: Secondary | ICD-10-CM | POA: Diagnosis not present

## 2019-11-27 DIAGNOSIS — G4733 Obstructive sleep apnea (adult) (pediatric): Secondary | ICD-10-CM | POA: Diagnosis not present

## 2019-11-27 DIAGNOSIS — R5383 Other fatigue: Secondary | ICD-10-CM | POA: Diagnosis not present

## 2019-12-05 DIAGNOSIS — F419 Anxiety disorder, unspecified: Secondary | ICD-10-CM | POA: Diagnosis not present

## 2019-12-05 DIAGNOSIS — L989 Disorder of the skin and subcutaneous tissue, unspecified: Secondary | ICD-10-CM | POA: Diagnosis not present

## 2019-12-05 DIAGNOSIS — E119 Type 2 diabetes mellitus without complications: Secondary | ICD-10-CM | POA: Diagnosis not present

## 2019-12-05 DIAGNOSIS — Z1331 Encounter for screening for depression: Secondary | ICD-10-CM | POA: Diagnosis not present

## 2019-12-19 DIAGNOSIS — K5900 Constipation, unspecified: Secondary | ICD-10-CM | POA: Diagnosis not present

## 2019-12-19 DIAGNOSIS — E119 Type 2 diabetes mellitus without complications: Secondary | ICD-10-CM | POA: Diagnosis not present

## 2019-12-19 DIAGNOSIS — D485 Neoplasm of uncertain behavior of skin: Secondary | ICD-10-CM | POA: Diagnosis not present

## 2019-12-19 DIAGNOSIS — Z01419 Encounter for gynecological examination (general) (routine) without abnormal findings: Secondary | ICD-10-CM | POA: Diagnosis not present

## 2019-12-19 DIAGNOSIS — Z131 Encounter for screening for diabetes mellitus: Secondary | ICD-10-CM | POA: Diagnosis not present

## 2019-12-19 DIAGNOSIS — Z13220 Encounter for screening for lipoid disorders: Secondary | ICD-10-CM | POA: Diagnosis not present

## 2019-12-19 DIAGNOSIS — L309 Dermatitis, unspecified: Secondary | ICD-10-CM | POA: Diagnosis not present

## 2019-12-19 DIAGNOSIS — Z86010 Personal history of colonic polyps: Secondary | ICD-10-CM | POA: Diagnosis not present

## 2020-03-06 ENCOUNTER — Inpatient Hospital Stay: Payer: BC Managed Care – PPO | Attending: Gynecologic Oncology | Admitting: Gynecologic Oncology

## 2020-03-06 ENCOUNTER — Other Ambulatory Visit: Payer: Self-pay

## 2020-03-06 ENCOUNTER — Inpatient Hospital Stay: Payer: BC Managed Care – PPO

## 2020-03-06 ENCOUNTER — Encounter: Payer: Self-pay | Admitting: Gynecologic Oncology

## 2020-03-06 VITALS — BP 164/95 | HR 68 | Temp 96.9°F | Resp 18 | Ht 62.0 in | Wt 257.1 lb

## 2020-03-06 DIAGNOSIS — Z87440 Personal history of urinary (tract) infections: Secondary | ICD-10-CM | POA: Diagnosis not present

## 2020-03-06 DIAGNOSIS — F419 Anxiety disorder, unspecified: Secondary | ICD-10-CM | POA: Insufficient documentation

## 2020-03-06 DIAGNOSIS — Z90710 Acquired absence of both cervix and uterus: Secondary | ICD-10-CM | POA: Insufficient documentation

## 2020-03-06 DIAGNOSIS — Z7984 Long term (current) use of oral hypoglycemic drugs: Secondary | ICD-10-CM | POA: Insufficient documentation

## 2020-03-06 DIAGNOSIS — J45909 Unspecified asthma, uncomplicated: Secondary | ICD-10-CM | POA: Diagnosis not present

## 2020-03-06 DIAGNOSIS — R971 Elevated cancer antigen 125 [CA 125]: Secondary | ICD-10-CM | POA: Diagnosis not present

## 2020-03-06 DIAGNOSIS — F329 Major depressive disorder, single episode, unspecified: Secondary | ICD-10-CM | POA: Diagnosis not present

## 2020-03-06 DIAGNOSIS — E119 Type 2 diabetes mellitus without complications: Secondary | ICD-10-CM | POA: Diagnosis not present

## 2020-03-06 DIAGNOSIS — Z79899 Other long term (current) drug therapy: Secondary | ICD-10-CM | POA: Insufficient documentation

## 2020-03-06 DIAGNOSIS — I10 Essential (primary) hypertension: Secondary | ICD-10-CM | POA: Insufficient documentation

## 2020-03-06 DIAGNOSIS — Z90722 Acquired absence of ovaries, bilateral: Secondary | ICD-10-CM | POA: Diagnosis not present

## 2020-03-06 DIAGNOSIS — D869 Sarcoidosis, unspecified: Secondary | ICD-10-CM | POA: Insufficient documentation

## 2020-03-06 DIAGNOSIS — Z7951 Long term (current) use of inhaled steroids: Secondary | ICD-10-CM | POA: Diagnosis not present

## 2020-03-06 DIAGNOSIS — D3910 Neoplasm of uncertain behavior of unspecified ovary: Secondary | ICD-10-CM | POA: Insufficient documentation

## 2020-03-06 DIAGNOSIS — M1990 Unspecified osteoarthritis, unspecified site: Secondary | ICD-10-CM | POA: Diagnosis not present

## 2020-03-06 NOTE — Patient Instructions (Signed)
Dr Denman George has ordered a CT scan for December. If your scans remain stable at that time, you will assume once a year follow-ups for 5 additional years.

## 2020-03-06 NOTE — Progress Notes (Signed)
Follow Up Note: Gyn-Onc  Melissa Richards 54 y.o. female  CC:  Chief Complaint  Patient presents with  . 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  1/ Stage IIIC serous LMP of ovary: No evidence of recurrence on August 2018 MRI. Will follow with CA 125's and annual MRI.. If CA 125 elevated or doubled, will order MRI now. CT scan of abd/pelvis and chest to monitor sarcoidosis nodules. 2/ depression: to continue treatment with her psychologist. 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  Follow-up in 6 months (December, 2021) with exam and CA 125 and CT scan, and if stable, will assume annual follow-up.    HPI:  Melissa Richards is a 54 year old G2P2 initially seen at the request of Dr Carlena Bjornstad for a large left ovarian mass and elevated CA 125 on November 1st, 2016.  She reported a 2 month history of increasing left sided abdominal discomfort and constipation and presented to the [facility] ER on 07/17/15 with symptoms of pelvic swelling, left lower back and abdominal pain.  A CT of the abdomen and pelvis on 07/17/15 showed bibasilar nodularity, a subcarinal 1.4cm node suggestive of infrahilar adenopathy, multiple small retroperitoneal nodes (none pathologic by size criteria) including a 49mm aortocaval node and a 1.5cm gastrohepatic ligament node. There is a portacaval node measuring 2.5 x 3.7cm (image/series 21/2) and a 1.2cm right external iliac node.  There is soft tissue fullness extending superiorly off the right ovary (measuring 6x10cm) and a dominant left ovarian cystic mass  arising from the pelvis measuring 18 x 22.5cm.   A CA 125 was drawn on 07/23/15 and was elevated at 2782. (CEA was normal at 0.4).  Medical history significant for morbid obesity, OSA, asthma and non-cardiac shortness of breath. She underwent coronary catheterization 2 years ago for this, which was negative for blockage, and she did not require stenting, or antiplatelet therapy. She was referred to pulmonology who diagnosed her as having asthma. She is a diabetic.  A Chest Ct on 07/30/15 Revealed: 1. Bilateral small pulmonary nodules are concerning for pulmonary metastasis. 2. Mild mediastinal and bilateral hilar adenopathy is also concerning for metastatic disease. 3. Mildly enlarged LEFT supraclavicular lymph node is concerning for metastatic disease. This lymph node may be amenable to ultrasound-guided biopsy 4. Enlargement of the LEFT lobe of thyroid gland likely represents benign goiter.    Due to the findings of unresectable chest and upper abdominal disease, she was scheduled for neoadjuvant chemotherapy following histologic confirmation via biopsy.  Biopsy of the supraclavicular node on 08/05/15 revealed FOCAL CLUSTERS WITH GRANULOMAS, SEE COMMENT. LYMPHOID TISSUE PRESENT. NO MALIGNANT CELLS IDENTIFIED.  Paracentesis of the ascites and biopsy of the pelvic soft tissue mass on 08/18/15 revealed SEROUS TUMOR WITH FOCAL PROLIFERATION, with the ascites showing papillary fragments of serous tumor (at least LMP).  Due to these inconclusive findings for malignancy and a concern that this is a low malignant potential tumor which would not be responsive to chemotherapy, chemotherapy was cancelled and surgery was scheduled. If this disease is LMP, it is likely that the chest and upper abdominal adenopathy is non-malignant.  On 09/04/15, she underwent  an exploratory laparotomy with TAH, bilateral salpingo-oophorectomy, omentectomy radical tumor debulking for ovarian cancer. 22 modifier for extreme  intraperitoneal obesity making surgical exposure more complex and increasing duration of procedure by 1 hour by Dr. [name].  Final pathology revealed: 1. 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 - NON-INVASIVE IMPLANT ON FALLOPIAN TUBE SEROSA. 2. Adnexa - ovary +/- tube, neoplastic, right - SEROUS BORDERLINE TUMOR, 8 CM WITH OVARIAN SURFACE INVOLVEMENT. - NON-INVASIVE IMPLANT ON FALLOPIAN TUBE SEROSA. 3. Uterus and cervix - CERVIX: NABOTHIAN CYST. - ENDOMETRIUM: INACTIVE. NO EVIDENCE OF HYPERPLASIA OR CARCINOMA. - MYOMETRIUM: LEIOMYOMA. - UTERINE SEROSA AND ATTACHED SOFT TISSUE: NON-INVASIVE IMPLANTS OF SEROUS BORDERLINE TUMOR. 4. Omentum, resection for tumor - MICROSCOPIC NON-INVASIVE IMPLANT OF SEROUS BORDERLINE TUMOR. - FOCI OF INFLAMMATION AND HYPEREMIA. 5. Soft tissue mass, simple excision, sigmoid - SEROUS BORDERLINE TUMOR, 8 CM CONSISTENT WITH NON-INVASIVE IMPLANT.   At her postoperative evaluation she had staples removed and dehiscence of her wound. A wound vac was applied and managed as an outpatient. There was no cellulitis or infection.  The patient had application of a wound vac for approximately 3 months. The wound completely healed.   She had repeat imaging with CT chest on 02/23/16 and MR abdomen on 02/23/16. C CT of the chest showed stable supraclavicular and hilar lymphadenopathy consistent with a diagnosis of sarcoidosis and unlikely to be reflective of metastatic borderline tumor The MRI of the abdomen demonstrated stable porta hepatis nodes also consistent with likely sarcoidosis and not metastatic disease. However a new enhancing 1 cm nodule was identified in the right paracolic gutter that was slightly increased since the February 2017 MRI could not exclude recurrent tumor implant. There was no other evidence of peritoneal metastases or ascites.  Repeat MRI in August 2017 showed complete resolution of the  right paracolic gutter implant. CA 125 on 05/26/16 was stable and normal at 23.  CA 125 on 08/09/16 was stable at 29.  Stable weight. She has persistent episodes of right mid abdominal cramping pain that feel like pulled muscles coming in waves ("like labor pains"). "they come and go".   CA 125 on 02/19/17 was stable at 20.6. MRI abdomen on 05/18/17 showed no metastatic disease, stable lymphadenopathy consistent with chronic inflammatory process.  CA 125 on 09/19/17 was stable at 21.7.  CA 125 on 03/24/18 was stable at 22.   MRI abdomen on 04/03/18 showed stably enlarged retroperitoneal adenopathy in the abdomen and pelvis and chronic hilar adenopathy and scattered pulmonary nodules felt to be due to sarcoidosis.  They recommended follow-up CT scan of the abdomen pelvis with IV contrast in 6 months to evaluate the retroperitoneal adenopathy for stability.  The 1.3 cm right paracolic gutter nodule was stable and unchanged since 2018 and was presumed to be benign.  CA 125 on 09/08/18 was normal at 18.6.   Interval History:    She is being worked up for possible cardiac sarcoidosis but this is an incomplete work-up due to her anxiety with proceeding with testing. She had an echo in January which she states was "good".  She had a CT abd/pelvis on 10/13/18 a stable exam with no new or progressive findings.  There is a stable appearance of multiple bilateral pulmonary nodules.  Hepatic steatosis was again appreciated.  Aortic atherosclerosis was seen.  There were no abdominal or pelvic nodules or carcinomatosis seen.  CA 125 in 09/04/19 was normal at 19.4. She has no symptoms  of recurrence.   Review of Systems  Constitutional: Feels physically well  Denies fever, chills, early satiety, unintentional weight loss or gain.  Cardiovascular: No chest pain, shortness of breath, or edema.  Pulmonary: No cough or wheeze.  Gastrointestinal: No nausea, vomiting, or diarrhea. No bright red blood per rectum or  change in bowel movement. Genitourinary: No frequency, urgency, or dysuria. No vaginal bleeding or discharge.  Musculoskeletal: No myalgia or joint pain. Neurologic: No weakness, numbness, or change in gait.  Psychology: + depression.  Current Meds:  Outpatient Encounter Medications as of 03/06/2020  Medication Sig  . albuterol (PROVENTIL HFA;VENTOLIN HFA) 108 (90 BASE) MCG/ACT inhaler Inhale 1-2 puffs into the lungs every 6 (six) hours as needed for wheezing or shortness of breath. Reported on 02/23/2016  . atenolol (TENORMIN) 25 MG tablet Take 25 mg by mouth daily.  Marland Kitchen atorvastatin (LIPITOR) 40 MG tablet Take 40 mg by mouth daily at 6 PM.   . budesonide-formoterol (SYMBICORT) 160-4.5 MCG/ACT inhaler Inhale 2 puffs into the lungs 2 (two) times daily. Reported on 02/23/2016  . busPIRone (BUSPAR) 15 MG tablet Take 15 mg by mouth 2 (two) times daily.  . cholecalciferol (VITAMIN D) 1000 units tablet Take 1,000 Units by mouth once a week.   . citalopram (CELEXA) 40 MG tablet Take 40 mg by mouth daily.  Marland Kitchen glipiZIDE (GLIPIZIDE XL) 5 MG 24 hr tablet Take 5 mg by mouth daily with breakfast.  . lisinopril (PRINIVIL,ZESTRIL) 20 MG tablet Take 20 mg by mouth daily.  . metFORMIN (GLUCOPHAGE) 500 MG tablet Take 500 mg by mouth daily with breakfast. Patient takes this medication twice a day.  . Multiple Vitamin (MULTIVITAMIN WITH MINERALS) TABS tablet Take 1 tablet by mouth daily.  . traZODone (DESYREL) 50 MG tablet Take 50 mg by mouth at bedtime.  . nitroGLYCERIN (NITROSTAT) 0.4 MG SL tablet Place 1 tablet (0.4 mg total) under the tongue every 5 (five) minutes as needed.   No facility-administered encounter medications on file as of 03/06/2020.    Allergy:  Allergies  Allergen Reactions  . Hydrocodone Itching    Can only take w benadryl    Social Hx:   Social History   Socioeconomic History  . Marital status: Married    Spouse name: Not on file  . Number of children: Not on file  . Years of  education: Not on file  . Highest education level: Not on file  Occupational History  . Not on file  Tobacco Use  . Smoking status: Never Smoker  . Smokeless tobacco: Never Used  Substance and Sexual Activity  . Alcohol use: No  . Drug use: No  . Sexual activity: Not on file  Other Topics Concern  . Not on file  Social History Narrative  . Not on file   Social Determinants of Health   Financial Resource Strain:   . Difficulty of Paying Living Expenses:   Food Insecurity:   . Worried About Charity fundraiser in the Last Year:   . Arboriculturist in the Last Year:   Transportation Needs:   . Film/video editor (Medical):   Marland Kitchen Lack of Transportation (Non-Medical):   Physical Activity:   . Days of Exercise per Week:   . Minutes of Exercise per Session:   Stress:   . Feeling of Stress :   Social Connections:   . Frequency of Communication with Friends and Family:   . Frequency of Social Gatherings with Friends and Family:   .  Attends Religious Services:   . Active Member of Clubs or Organizations:   . Attends Archivist Meetings:   Marland Kitchen Marital Status:   Intimate Partner Violence:   . Fear of Current or Ex-Partner:   . Emotionally Abused:   Marland Kitchen Physically Abused:   . Sexually Abused:     Past Surgical Hx:  Past Surgical History:  Procedure Laterality Date  . ABDOMINAL HYSTERECTOMY Bilateral 09/04/2015   Procedure: HYSTERECTOMY TOTAL ABDOMINAL, EXPLORATORY LAPAROTOMY, BILATERAL SALPINGO OOPHERECTOMY, OMENTECTOMY, DEBULKING;  Surgeon: [name], MD;  Location: [facility];  Service: Gynecology;  Laterality: Bilateral;  . CARDIAC CATHETERIZATION     2 years ago  . TONSILLECTOMY    . TUBAL LIGATION      Past Medical Hx:  Past Medical History:  Diagnosis Date  . Adnexal mass    BILATERAL  . Anxiety   . Arthritis   . Asthma   . Constipation   . Depression   . Diabetes mellitus without complication ([place])   . Family history of adverse reaction to anesthesia     FAMILY MEMBERS ARE SLOW TO WAKE UP  . Hypertension   . Numbness and tingling    FEET /LEGS  . ovarian ca dx'd 07/2015  . Recurrent UTI   . Sleep apnea    DOES NOT USE C -PAP    Family Hx:  Family History  Problem Relation Age of Onset  . Anesthesia problems Mother   . Diabetes Mother   . Hypertension Mother   . Cancer Father   . Diabetes Brother     Vitals:  Blood pressure (!) 164/95, pulse 68, temperature (!) 96.9 F (36.1 C), temperature source Tympanic, resp. rate 18, height 5\' 2"  (1.575 m), weight 257 lb 1.6 oz (116.6 kg), SpO2 99 %.  Physical Exam:  General: Well developed, well nourished female in no acute distress. Alert and oriented x 3.  Cardiovascular: Regular rate and rhythm. S1 and S2 normal.  Lungs: Clear to auscultation bilaterally. No wheezes/crackles/rhonchi noted.  Skin: No rashes or lesions present. Back: No CVA tenderness.  Genitourinary: vaginal cuff well healed, no bleeding or discharge.No pelvic masses. Speculum exam revealed no blood.  Rectal: no palpable masses or lesions. Abdomen: Abdomen soft, non-tender and morbidly obese. Wound completely healed. Extremities: No bilateral cyanosis, edema, or clubbing.    Thereasa Solo, MD  03/06/2020, 1:43 PM

## 2020-03-07 ENCOUNTER — Telehealth: Payer: Self-pay

## 2020-03-07 LAB — CA 125: Cancer Antigen (CA) 125: 19.4 U/mL (ref 0.0–38.1)

## 2020-03-07 NOTE — Telephone Encounter (Signed)
Told ms Henry that the CA-125 was stable and WNL at 19.4 per [name], NP. Pt verbalized understanding.

## 2020-03-17 DIAGNOSIS — Z860100 Personal history of colon polyps, unspecified: Secondary | ICD-10-CM | POA: Insufficient documentation

## 2020-03-17 DIAGNOSIS — Z1159 Encounter for screening for other viral diseases: Secondary | ICD-10-CM

## 2020-03-17 DIAGNOSIS — Z6841 Body Mass Index (BMI) 40.0 and over, adult: Secondary | ICD-10-CM | POA: Diagnosis not present

## 2020-03-17 HISTORY — DX: Personal history of colon polyps, unspecified: Z86.0100

## 2020-03-17 HISTORY — DX: Personal history of colonic polyps: Z86.010

## 2020-03-17 HISTORY — DX: Encounter for screening for other viral diseases: Z11.59

## 2020-03-20 DIAGNOSIS — Z86010 Personal history of colonic polyps: Secondary | ICD-10-CM | POA: Diagnosis not present

## 2020-03-20 DIAGNOSIS — Z20822 Contact with and (suspected) exposure to covid-19: Secondary | ICD-10-CM | POA: Diagnosis not present

## 2020-03-20 DIAGNOSIS — Z01812 Encounter for preprocedural laboratory examination: Secondary | ICD-10-CM | POA: Diagnosis not present

## 2020-03-24 ENCOUNTER — Telehealth: Payer: Self-pay

## 2020-03-24 NOTE — Telephone Encounter (Signed)
Gave Melissa Richards the cancer diagnosis with staging from surgery 09-04-15. Her first visit with [name] was 07-28-15. And surgery per formed at [facility] and current address given.

## 2020-03-27 DIAGNOSIS — Z1211 Encounter for screening for malignant neoplasm of colon: Secondary | ICD-10-CM | POA: Diagnosis not present

## 2020-03-27 DIAGNOSIS — Z09 Encounter for follow-up examination after completed treatment for conditions other than malignant neoplasm: Secondary | ICD-10-CM | POA: Diagnosis not present

## 2020-03-27 DIAGNOSIS — K5730 Diverticulosis of large intestine without perforation or abscess without bleeding: Secondary | ICD-10-CM | POA: Diagnosis not present

## 2020-03-27 DIAGNOSIS — Z86010 Personal history of colonic polyps: Secondary | ICD-10-CM | POA: Diagnosis not present

## 2020-03-28 DIAGNOSIS — F419 Anxiety disorder, unspecified: Secondary | ICD-10-CM | POA: Diagnosis not present

## 2020-03-28 DIAGNOSIS — E119 Type 2 diabetes mellitus without complications: Secondary | ICD-10-CM | POA: Diagnosis not present

## 2020-03-28 DIAGNOSIS — F329 Major depressive disorder, single episode, unspecified: Secondary | ICD-10-CM | POA: Diagnosis not present

## 2020-03-28 DIAGNOSIS — I10 Essential (primary) hypertension: Secondary | ICD-10-CM | POA: Diagnosis not present

## 2020-04-25 DIAGNOSIS — R635 Abnormal weight gain: Secondary | ICD-10-CM | POA: Diagnosis not present

## 2020-04-25 DIAGNOSIS — I10 Essential (primary) hypertension: Secondary | ICD-10-CM | POA: Diagnosis not present

## 2020-04-25 DIAGNOSIS — J309 Allergic rhinitis, unspecified: Secondary | ICD-10-CM | POA: Diagnosis not present

## 2020-04-25 DIAGNOSIS — E559 Vitamin D deficiency, unspecified: Secondary | ICD-10-CM | POA: Diagnosis not present

## 2020-04-29 ENCOUNTER — Ambulatory Visit: Payer: BC Managed Care – PPO | Admitting: Cardiology

## 2020-04-29 ENCOUNTER — Encounter: Payer: Self-pay | Admitting: Cardiology

## 2020-04-29 ENCOUNTER — Other Ambulatory Visit: Payer: Self-pay

## 2020-04-29 VITALS — BP 174/102 | HR 79 | Wt 256.0 lb

## 2020-04-29 DIAGNOSIS — I10 Essential (primary) hypertension: Secondary | ICD-10-CM

## 2020-04-29 DIAGNOSIS — G4733 Obstructive sleep apnea (adult) (pediatric): Secondary | ICD-10-CM | POA: Diagnosis not present

## 2020-04-29 DIAGNOSIS — R0789 Other chest pain: Secondary | ICD-10-CM

## 2020-04-29 DIAGNOSIS — E088 Diabetes mellitus due to underlying condition with unspecified complications: Secondary | ICD-10-CM

## 2020-04-29 DIAGNOSIS — I209 Angina pectoris, unspecified: Secondary | ICD-10-CM

## 2020-04-29 DIAGNOSIS — D869 Sarcoidosis, unspecified: Secondary | ICD-10-CM

## 2020-04-29 DIAGNOSIS — Z6841 Body Mass Index (BMI) 40.0 and over, adult: Secondary | ICD-10-CM

## 2020-04-29 HISTORY — DX: Diabetes mellitus due to underlying condition with unspecified complications: E08.8

## 2020-04-29 HISTORY — DX: Angina pectoris, unspecified: I20.9

## 2020-04-29 MED ORDER — ATENOLOL 50 MG PO TABS
50.0000 mg | ORAL_TABLET | ORAL | 3 refills | Status: AC
Start: 2020-04-29 — End: ?

## 2020-04-29 NOTE — Progress Notes (Signed)
Cardiology Office Note:    Date:  04/29/2020   ID:  Melissa Richards, DOB 05/07/1966, MRN [id number]  PCP:  Serita Grammes, MD  Cardiologist:  Jenean Lindau, MD   Referring MD: Serita Grammes, MD    ASSESSMENT:    1. Essential hypertension   2. Angina pectoris ([place])   3. Morbid obesity with BMI of 45.0-49.9, adult ([place])   4. Obstructive sleep apnea   5. Sarcoidosis   6. Diabetes mellitus due to underlying condition with unspecified complications ([place])    PLAN:    In order of problems listed above:  1. Angina pectoris: Patient symptoms are significant.  She has multiple risk factors for coronary artery disease.  I gave her multiple options including stress testing and coronary angiography with invasive and and noninvasive modalities she prefers CT coronary angiography with FFR.  Benefits and potential is explained and she vocalized understanding and is agreeable.  She was advised to take a coated baby aspirin on a daily basis.  She knows to go to nearest emergency room for any concerning symptoms.  I told her not to exert herself until this evaluation was done. 2. Essential hypertension: Blood pressure stable she mentions to me that her blood pressure is fine at home she has an element of whitecoat hypertension.  In view of elevated heart rate and blood pressure I have doubled up her beta-blocker to 50 mg daily. 3. Diabetes mellitus and morbid obesity: Diet was emphasized to the patient at extensive length.  Risks of obesity explained and she promises to comply with instructions.  She will be seen follow-up appointment in 3 months or earlier if she has any concerns.   Medication Adjustments/Labs and Tests Ordered: Current medicines are reviewed at length with the patient today.  Concerns regarding medicines are outlined above.  No orders of the defined types were placed in this encounter.  No orders of the defined types were placed in this encounter.    No chief complaint on  file.    History of Present Illness:    Melissa Richards is a 54 y.o. female.  Patient has past medical history of essential hypertension dyslipidemia diabetes mellitus and morbid obesity.  She leads a sedentary lifestyle.  She mentions to me that she was evaluated in the past for chest tightness but she never underwent stress testing.  Now she mentions to me that with exertion she has shortness of breath and some chest tightness.  No orthopnea or PND.  With rest she has no such symptoms.  No radiation to the neck or the arms.  At the time of my evaluation she is alert awake oriented and in no distress.  Past Medical History:  Diagnosis Date  . Adnexal mass    BILATERAL  . Anxiety   . Arthritis   . Asthma   . Constipation   . Depression   . Diabetes mellitus without complication ([place])   . Family history of adverse reaction to anesthesia    FAMILY MEMBERS ARE SLOW TO WAKE UP  . Hypertension   . Numbness and tingling    FEET /LEGS  . ovarian ca dx'd 07/2015  . Recurrent UTI   . Sleep apnea    DOES NOT USE C -PAP    Past Surgical History:  Procedure Laterality Date  . ABDOMINAL HYSTERECTOMY Bilateral 09/04/2015   Procedure: HYSTERECTOMY TOTAL ABDOMINAL, EXPLORATORY LAPAROTOMY, BILATERAL SALPINGO OOPHERECTOMY, OMENTECTOMY, DEBULKING;  Surgeon: [name], MD;  Location: [facility];  Service: Gynecology;  Laterality: Bilateral;  . CARDIAC CATHETERIZATION     2 years ago  . TONSILLECTOMY    . TUBAL LIGATION      Current Medications: Current Meds  Medication Sig  . albuterol (PROVENTIL HFA;VENTOLIN HFA) 108 (90 BASE) MCG/ACT inhaler Inhale 1-2 puffs into the lungs every 6 (six) hours as needed for wheezing or shortness of breath. Reported on 02/23/2016  . aspirin EC 81 MG tablet Take 81 mg by mouth daily. Swallow whole.  . budesonide-formoterol (SYMBICORT) 160-4.5 MCG/ACT inhaler Inhale 2 puffs into the lungs 2 (two) times daily. Reported on 02/23/2016  . buPROPion (WELLBUTRIN SR) 150 MG 12  hr tablet Take 150 mg by mouth daily.  . busPIRone (BUSPAR) 15 MG tablet Take 15 mg by mouth 2 (two) times daily.  Marland Kitchen levocetirizine (XYZAL) 5 MG tablet Take 5 mg by mouth every evening.  Marland Kitchen lisinopril (PRINIVIL,ZESTRIL) 20 MG tablet Take 20 mg by mouth daily.  . metFORMIN (GLUCOPHAGE) 500 MG tablet Take 500 mg by mouth daily with breakfast. Patient takes this medication twice a day.  . Montelukast Sodium (SINGULAIR PO) Take 5 mg by mouth daily.  . Multiple Vitamin (MULTIVITAMIN ADULT PO) Take 1 tablet by mouth daily.  . TRIAMTERENE-HCTZ PO Take by mouth.  . [DISCONTINUED] atenolol (TENORMIN) 25 MG tablet Take 25 mg by mouth daily.     Allergies:   Hydrocodone   Social History   Socioeconomic History  . Marital status: Married    Spouse name: Not on file  . Number of children: Not on file  . Years of education: Not on file  . Highest education level: Not on file  Occupational History  . Not on file  Tobacco Use  . Smoking status: Never Smoker  . Smokeless tobacco: Never Used  Vaping Use  . Vaping Use: Never used  Substance and Sexual Activity  . Alcohol use: No  . Drug use: No  . Sexual activity: Not on file  Other Topics Concern  . Not on file  Social History Narrative  . Not on file   Social Determinants of Health   Financial Resource Strain:   . Difficulty of Paying Living Expenses:   Food Insecurity:   . Worried About Charity fundraiser in the Last Year:   . Arboriculturist in the Last Year:   Transportation Needs:   . Film/video editor (Medical):   Marland Kitchen Lack of Transportation (Non-Medical):   Physical Activity:   . Days of Exercise per Week:   . Minutes of Exercise per Session:   Stress:   . Feeling of Stress :   Social Connections:   . Frequency of Communication with Friends and Family:   . Frequency of Social Gatherings with Friends and Family:   . Attends Religious Services:   . Active Member of Clubs or Organizations:   . Attends Archivist  Meetings:   Marland Kitchen Marital Status:      Family History: The patient's family history includes Anesthesia problems in her mother; Cancer in her father; Diabetes in her brother and mother; Hypertension in her mother.  ROS:   Please see the history of present illness.    All other systems reviewed and are negative.  EKGs/Labs/Other Studies Reviewed:    The following studies were reviewed today: EKG reveals sinus rhythm and nonspecific ST-T changes lab work from [employer] sheet was reviewed   Recent Labs: 09/20/2019: BUN 13; Creatinine, Ser 1.04; Potassium 4.2; Sodium 137  Recent Lipid  Panel No results found for: CHOL, TRIG, HDL, CHOLHDL, VLDL, LDLCALC, LDLDIRECT  Physical Exam:    VS:  BP (!) 174/102   Pulse 79   Wt (!) 256 lb (116.1 kg)   SpO2 97%   BMI 46.82 kg/m     Wt Readings from Last 3 Encounters:  04/29/20 (!) 256 lb (116.1 kg)  03/06/20 257 lb 1.6 oz (116.6 kg)  09/20/19 257 lb (116.6 kg)     GEN: Patient is in no acute distress HEENT: Normal NECK: No JVD; No carotid bruits LYMPHATICS: No lymphadenopathy CARDIAC: Hear sounds regular, 2/6 systolic murmur at the apex. RESPIRATORY:  Clear to auscultation without rales, wheezing or rhonchi  ABDOMEN: Soft, non-tender, non-distended MUSCULOSKELETAL:  No edema; No deformity  SKIN: Warm and dry NEUROLOGIC:  Alert and oriented x 3 PSYCHIATRIC:  Normal affect   Signed, Jenean Lindau, MD  04/29/2020 11:17 AM    [place]

## 2020-04-29 NOTE — Patient Instructions (Addendum)
Medication Instructions:  1) Start Aspirin 81 mg daily   2) Increase Atenolol to 50 mg every morning   *If you need a refill on your cardiac medications before your next appointment, please call your pharmacy*   Lab Work: None ordered   If you have labs (blood work) drawn today and your tests are completely normal, you will receive your results only by: Marland Kitchen MyChart Message (if you have MyChart) OR . A paper copy in the mail If you have any lab test that is abnormal or we need to change your treatment, we will call you to review the results.   Testing/Procedures: Your physician has ordered for you to have a Cardiac CTA *Instructions below*   Follow-Up: At [facility], you and your health needs are our priority.  As part of our continuing mission to provide you with exceptional heart care, we have created designated Provider Care Teams.  These Care Teams include your primary Cardiologist (physician) and Advanced Practice Providers (APPs -  Physician Assistants and Nurse Practitioners) who all work together to provide you with the care you need, when you need it.  We recommend signing up for the patient portal called "MyChart".  Sign up information is provided on this After Visit Summary.  MyChart is used to connect with patients for Virtual Visits (Telemedicine).  Patients are able to view lab/test results, encounter notes, upcoming appointments, etc.  Non-urgent messages can be sent to your provider as well.   To learn more about what you can do with MyChart, go to [email or web address].    Your next appointment:   3 month(s)  The format for your next appointment:   In Person  Provider:   Jyl Heinz, MD   Other Instructions Your cardiac CT will be scheduled at one of the below locations:   [facility] [address] [place], [place] [address] [phone]   If scheduled at [facility], please arrive at the [facility] main entrance of [facility] 30 minutes prior to test start time. Proceed to the [facility] Radiology Department (first floor) to check-in and test prep.  Please follow these instructions carefully (unless otherwise directed):   On the Night Before the Test: . Be sure to Drink plenty of water. . Do not consume any caffeinated/decaffeinated beverages or chocolate 12 hours prior to your test. . Do not take any antihistamines 12 hours prior to your test.   On the Day of the Test: . Drink plenty of water. Do not drink any water within one hour of the test. . Do not eat any food 4 hours prior to the test. . You may take your regular medications prior to the test.  . Take Atenolol two hours prior to test. . FEMALES- please wear underwire-free bra if available       After the Test: . Drink plenty of water. . After receiving IV contrast, you may experience a mild flushed feeling. This is normal. . On occasion, you may experience a mild rash up to 24 hours after the test. This is not dangerous. If this occurs, you can take Benadryl 25 mg and increase your fluid intake. . If you experience trouble breathing, this can be serious. If it is severe call 911 IMMEDIATELY. If it is mild, please call our office. . If you take any of these medications: Glipizide/Metformin, Avandament, Glucavance, please do not take 48 hours after completing test unless otherwise instructed.   Once we have confirmed  authorization from your insurance company, we will call you to set up a date and time for your test. Based on how quickly your insurance processes prior authorizations requests, please allow up to 4 weeks to be contacted for scheduling your Cardiac CT appointment. Be advised that routine Cardiac CT appointments could be scheduled as many as 8 weeks after your provider has ordered it.  For non-scheduling related questions, please contact the cardiac imaging nurse navigator should you have any questions/concerns: Marchia Bond,  Cardiac Imaging Nurse Navigator Burley Saver, Interim Cardiac Imaging Nurse [place] [facility] Direct Office Dial: [phone]   For scheduling needs, including cancellations and rescheduling, please call Vivien Rota at [phone], option 3.

## 2020-05-14 DIAGNOSIS — I209 Angina pectoris, unspecified: Secondary | ICD-10-CM | POA: Diagnosis not present

## 2020-05-14 LAB — BASIC METABOLIC PANEL
BUN/Creatinine Ratio: 12 (ref 9–23)
BUN: 12 mg/dL (ref 6–24)
CO2: 22 mmol/L (ref 20–29)
Calcium: 9.8 mg/dL (ref 8.7–10.2)
Chloride: 97 mmol/L (ref 96–106)
Creatinine, Ser: 0.97 mg/dL (ref 0.57–1.00)
GFR calc Af Amer: 76 mL/min/{1.73_m2} (ref >59)
GFR calc non Af Amer: 65 mL/min/{1.73_m2} (ref >59)
Glucose: 210 mg/dL — ABNORMAL HIGH (ref 65–99)
Potassium: 4 mmol/L (ref 3.5–5.2)
Sodium: 137 mmol/L (ref 134–144)

## 2020-05-14 NOTE — Addendum Note (Signed)
Addended by: Ashok Norris on: 05/14/2020 09:48 AM   Modules accepted: Orders

## 2020-05-16 ENCOUNTER — Telehealth (HOSPITAL_COMMUNITY): Payer: Self-pay | Admitting: Emergency Medicine

## 2020-05-16 NOTE — Telephone Encounter (Signed)
Reaching out to patient to offer assistance regarding upcoming cardiac imaging study; pt verbalizes understanding of appt date/time, parking situation and where to check in, pre-test NPO status and medications ordered, and verified current allergies; name and call back number provided for further questions should they arise Artice Bergerson RN Navigator Cardiac Imaging [facility] [phone] office [phone] cell 

## 2020-05-20 ENCOUNTER — Ambulatory Visit (HOSPITAL_COMMUNITY)
Admission: RE | Admit: 2020-05-20 | Discharge: 2020-05-20 | Disposition: A | Discharge status: Home / Self Care | Payer: BC Managed Care – PPO | Source: Ambulatory Visit | Attending: Cardiology | Admitting: Cardiology

## 2020-05-20 DIAGNOSIS — I209 Angina pectoris, unspecified: Secondary | ICD-10-CM | POA: Diagnosis not present

## 2020-05-20 MED ORDER — IOHEXOL 350 MG/ML SOLN
80.0000 mL | Freq: Once | INTRAVENOUS | Status: AC | PRN
  Administered 2020-05-20: 80 mL via INTRAVENOUS

## 2020-05-20 MED ORDER — NITROGLYCERIN 0.4 MG SL SUBL
0.8000 mg | SUBLINGUAL_TABLET | Freq: Once | SUBLINGUAL | Status: AC
  Administered 2020-05-20: 0.8 mg via SUBLINGUAL

## 2020-05-20 MED ORDER — NITROGLYCERIN 0.4 MG SL SUBL
SUBLINGUAL_TABLET | SUBLINGUAL | Status: AC
  Filled 2020-05-20: qty 2

## 2020-05-22 ENCOUNTER — Telehealth: Payer: Self-pay | Admitting: Cardiology

## 2020-05-22 DIAGNOSIS — Z79899 Other long term (current) drug therapy: Secondary | ICD-10-CM

## 2020-05-22 DIAGNOSIS — I209 Angina pectoris, unspecified: Secondary | ICD-10-CM

## 2020-05-22 NOTE — Telephone Encounter (Signed)
Patient is calling to discuss results from CT completed on 05/20/20. Please call.

## 2020-05-22 NOTE — Telephone Encounter (Signed)
Called and talked with pt and informed her that the result note had not been done and as soon as it was completed I would call her to discuss.

## 2020-05-27 NOTE — Telephone Encounter (Signed)
Follow up   Pt is calling back to follow up stress test result

## 2020-05-27 NOTE — Telephone Encounter (Signed)
Results reviewed with pt as per Dr. Revankar's note.  Pt verbalized understanding and had no additional questions. Routed to PCP.  

## 2020-05-28 DIAGNOSIS — I209 Angina pectoris, unspecified: Secondary | ICD-10-CM | POA: Diagnosis not present

## 2020-05-28 DIAGNOSIS — Z79899 Other long term (current) drug therapy: Secondary | ICD-10-CM | POA: Diagnosis not present

## 2020-05-28 LAB — LIPID PANEL
Chol/HDL Ratio: 5.9 ratio — ABNORMAL HIGH (ref 0.0–4.4)
Cholesterol, Total: 176 mg/dL (ref 100–199)
HDL: 30 mg/dL — ABNORMAL LOW (ref >39)
LDL Chol Calc (NIH): 116 mg/dL — ABNORMAL HIGH (ref 0–99)
Triglycerides: 169 mg/dL — ABNORMAL HIGH (ref 0–149)
VLDL Cholesterol Cal: 30 mg/dL (ref 5–40)

## 2020-05-28 LAB — HEPATIC FUNCTION PANEL
ALT: 33 IU/L — ABNORMAL HIGH (ref 0–32)
AST: 23 IU/L (ref 0–40)
Albumin: 4.3 g/dL (ref 3.8–4.9)
Alkaline Phosphatase: 62 IU/L (ref 48–121)
Bilirubin Total: 0.4 mg/dL (ref 0.0–1.2)
Bilirubin, Direct: 0.13 mg/dL (ref 0.00–0.40)
Total Protein: 6.6 g/dL (ref 6.0–8.5)

## 2020-05-28 NOTE — Addendum Note (Signed)
Addended by: Truddie Hidden on: 05/28/2020 09:30 AM   Modules accepted: Orders

## 2020-05-30 MED ORDER — ATORVASTATIN CALCIUM 10 MG PO TABS
10.0000 mg | ORAL_TABLET | Freq: Every day | ORAL | 6 refills | Status: DC
Start: 2020-05-30 — End: 2020-11-17

## 2020-05-30 NOTE — Addendum Note (Signed)
Addended by: Truddie Hidden on: 05/30/2020 05:11 PM   Modules accepted: Orders

## 2020-05-30 NOTE — Addendum Note (Signed)
Addended by: Truddie Hidden on: 05/30/2020 11:51 AM   Modules accepted: Orders

## 2020-07-22 DIAGNOSIS — D860 Sarcoidosis of lung: Secondary | ICD-10-CM | POA: Diagnosis not present

## 2020-07-22 DIAGNOSIS — E119 Type 2 diabetes mellitus without complications: Secondary | ICD-10-CM | POA: Diagnosis not present

## 2020-07-22 DIAGNOSIS — I700 Atherosclerosis of aorta: Secondary | ICD-10-CM | POA: Diagnosis not present

## 2020-07-22 DIAGNOSIS — E559 Vitamin D deficiency, unspecified: Secondary | ICD-10-CM | POA: Diagnosis not present

## 2020-07-29 DIAGNOSIS — R202 Paresthesia of skin: Secondary | ICD-10-CM | POA: Insufficient documentation

## 2020-07-29 DIAGNOSIS — G4730 Sleep apnea, unspecified: Secondary | ICD-10-CM | POA: Insufficient documentation

## 2020-07-29 DIAGNOSIS — F32A Depression, unspecified: Secondary | ICD-10-CM | POA: Insufficient documentation

## 2020-07-29 DIAGNOSIS — I10 Essential (primary) hypertension: Secondary | ICD-10-CM | POA: Insufficient documentation

## 2020-07-29 DIAGNOSIS — E119 Type 2 diabetes mellitus without complications: Secondary | ICD-10-CM | POA: Insufficient documentation

## 2020-07-29 DIAGNOSIS — Z8489 Family history of other specified conditions: Secondary | ICD-10-CM | POA: Insufficient documentation

## 2020-07-29 DIAGNOSIS — F419 Anxiety disorder, unspecified: Secondary | ICD-10-CM | POA: Insufficient documentation

## 2020-07-29 DIAGNOSIS — N9489 Other specified conditions associated with female genital organs and menstrual cycle: Secondary | ICD-10-CM | POA: Insufficient documentation

## 2020-07-29 DIAGNOSIS — M1990 Unspecified osteoarthritis, unspecified site: Secondary | ICD-10-CM | POA: Insufficient documentation

## 2020-07-29 DIAGNOSIS — N390 Urinary tract infection, site not specified: Secondary | ICD-10-CM | POA: Insufficient documentation

## 2020-07-29 DIAGNOSIS — R200 Anesthesia of skin: Secondary | ICD-10-CM | POA: Insufficient documentation

## 2020-07-29 DIAGNOSIS — C801 Malignant (primary) neoplasm, unspecified: Secondary | ICD-10-CM | POA: Insufficient documentation

## 2020-07-29 DIAGNOSIS — K5900 Constipation, unspecified: Secondary | ICD-10-CM | POA: Insufficient documentation

## 2020-07-30 ENCOUNTER — Ambulatory Visit: Payer: BC Managed Care – PPO | Admitting: Cardiology

## 2020-07-30 ENCOUNTER — Encounter: Payer: Self-pay | Admitting: Cardiology

## 2020-07-30 ENCOUNTER — Other Ambulatory Visit: Payer: Self-pay

## 2020-07-30 VITALS — BP 152/83 | HR 70 | Ht 62.0 in | Wt 239.0 lb

## 2020-07-30 DIAGNOSIS — E782 Mixed hyperlipidemia: Secondary | ICD-10-CM

## 2020-07-30 DIAGNOSIS — E088 Diabetes mellitus due to underlying condition with unspecified complications: Secondary | ICD-10-CM

## 2020-07-30 DIAGNOSIS — I209 Angina pectoris, unspecified: Secondary | ICD-10-CM | POA: Diagnosis not present

## 2020-07-30 DIAGNOSIS — Z1329 Encounter for screening for other suspected endocrine disorder: Secondary | ICD-10-CM

## 2020-07-30 DIAGNOSIS — I2510 Atherosclerotic heart disease of native coronary artery without angina pectoris: Secondary | ICD-10-CM | POA: Insufficient documentation

## 2020-07-30 DIAGNOSIS — G4733 Obstructive sleep apnea (adult) (pediatric): Secondary | ICD-10-CM

## 2020-07-30 HISTORY — DX: Morbid (severe) obesity due to excess calories: E66.01

## 2020-07-30 HISTORY — DX: Mixed hyperlipidemia: E78.2

## 2020-07-30 HISTORY — DX: Atherosclerotic heart disease of native coronary artery without angina pectoris: I25.10

## 2020-07-30 NOTE — Patient Instructions (Signed)
Medication Instructions:  No medication changes. *If you need a refill on your cardiac medications before your next appointment, please call your pharmacy*   Lab Work: Your physician recommends that you have labs done in the office today. Your test included  basic metabolic panel, complete blood count, TSH, liver function and lipids.  If you have labs (blood work) drawn today and your tests are completely normal, you will receive your results only by: Marland Kitchen MyChart Message (if you have MyChart) OR . A paper copy in the mail If you have any lab test that is abnormal or we need to change your treatment, we will call you to review the results.   Testing/Procedures: None ordered   Follow-Up: At [facility], you and your health needs are our priority.  As part of our continuing mission to provide you with exceptional heart care, we have created designated Provider Care Teams.  These Care Teams include your primary Cardiologist (physician) and Advanced Practice Providers (APPs -  Physician Assistants and Nurse Practitioners) who all work together to provide you with the care you need, when you need it.  We recommend signing up for the patient portal called "MyChart".  Sign up information is provided on this After Visit Summary.  MyChart is used to connect with patients for Virtual Visits (Telemedicine).  Patients are able to view lab/test results, encounter notes, upcoming appointments, etc.  Non-urgent messages can be sent to your provider as well.   To learn more about what you can do with MyChart, go to [email or web address].    Your next appointment:   3 month(s)  The format for your next appointment:   In Person  Provider:   Jyl Heinz, MD   Other Instructions Nitroglycerin sublingual tablets What is this medicine? NITROGLYCERIN (nye troe GLI ser in) is a type of vasodilator. It relaxes blood vessels, increasing the blood and oxygen supply to your heart. This medicine is used  to relieve chest pain caused by angina. It is also used to prevent chest pain before activities like climbing stairs, going outdoors in cold weather, or sexual activity. This medicine may be used for other purposes; ask your health care provider or pharmacist if you have questions. COMMON BRAND NAME(S): Nitroquick, Nitrostat, Nitrotab What should I tell my health care provider before I take this medicine? They need to know if you have any of these conditions:  anemia  head injury, recent stroke, or bleeding in the brain  liver disease  previous heart attack  an unusual or allergic reaction to nitroglycerin, other medicines, foods, dyes, or preservatives  pregnant or trying to get pregnant  breast-feeding How should I use this medicine? Take this medicine by mouth as needed. At the first sign of an angina attack (chest pain or tightness) place one tablet under your tongue. You can also take this medicine 5 to 10 minutes before an event likely to produce chest pain. Follow the directions on the prescription label. Let the tablet dissolve under the tongue. Do not swallow whole. Replace the dose if you accidentally swallow it. It will help if your mouth is not dry. Saliva around the tablet will help it to dissolve more quickly. Do not eat or drink, smoke or chew tobacco while a tablet is dissolving. If you are not better within 5 minutes after taking ONE dose of nitroglycerin, call 9-1-1 immediately to seek emergency medical care. Do not take more than 3 nitroglycerin tablets over 15 minutes. If you take this medicine often  to relieve symptoms of angina, your doctor or health care professional may provide you with different instructions to manage your symptoms. If symptoms do not go away after following these instructions, it is important to call 9-1-1 immediately. Do not take more than 3 nitroglycerin tablets over 15 minutes. Talk to your pediatrician regarding the use of this medicine in  children. Special care may be needed. Overdosage: If you think you have taken too much of this medicine contact a poison control center or emergency room at once. NOTE: This medicine is only for you. Do not share this medicine with others. What if I miss a dose? This does not apply. This medicine is only used as needed. What may interact with this medicine? Do not take this medicine with any of the following medications:  certain migraine medicines like ergotamine and dihydroergotamine (DHE)  medicines used to treat erectile dysfunction like sildenafil, tadalafil, and vardenafil  riociguat This medicine may also interact with the following medications:  alteplase  aspirin  heparin  medicines for high blood pressure  medicines for mental depression  other medicines used to treat angina  phenothiazines like chlorpromazine, mesoridazine, prochlorperazine, thioridazine This list may not describe all possible interactions. Give your health care provider a list of all the medicines, herbs, non-prescription drugs, or dietary supplements you use. Also tell them if you smoke, drink alcohol, or use illegal drugs. Some items may interact with your medicine. What should I watch for while using this medicine? Tell your doctor or health care professional if you feel your medicine is no longer working. Keep this medicine with you at all times. Sit or lie down when you take your medicine to prevent falling if you feel dizzy or faint after using it. Try to remain calm. This will help you to feel better faster. If you feel dizzy, take several deep breaths and lie down with your feet propped up, or bend forward with your head resting between your knees. You may get drowsy or dizzy. Do not drive, use machinery, or do anything that needs mental alertness until you know how this drug affects you. Do not stand or sit up quickly, especially if you are an older patient. This reduces the risk of dizzy or  fainting spells. Alcohol can make you more drowsy and dizzy. Avoid alcoholic drinks. Do not treat yourself for coughs, colds, or pain while you are taking this medicine without asking your doctor or health care professional for advice. Some ingredients may increase your blood pressure. What side effects may I notice from receiving this medicine? Side effects that you should report to your doctor or health care professional as soon as possible:  blurred vision  dry mouth  skin rash  sweating  the feeling of extreme pressure in the head  unusually weak or tired Side effects that usually do not require medical attention (report to your doctor or health care professional if they continue or are bothersome):  flushing of the face or neck  headache  irregular heartbeat, palpitations  nausea, vomiting This list may not describe all possible side effects. Call your doctor for medical advice about side effects. You may report side effects to FDA at 1-800-FDA-1088. Where should I keep my medicine? Keep out of the reach of children. Store at room temperature between 20 and 25 degrees C (68 and 77 degrees F). Store in Chief of Staff. Protect from light and moisture. Keep tightly closed. Throw away any unused medicine after the expiration date. NOTE: This  sheet is a summary. It may not cover all possible information. If you have questions about this medicine, talk to your doctor, pharmacist, or health care provider.  2020 Elsevier/Gold Standard (2013-07-19 17:57:36)

## 2020-07-30 NOTE — Progress Notes (Signed)
Cardiology Office Note:    Date:  07/30/2020   ID:  Melissa Richards, DOB 08-08-1966, MRN [id number]  PCP:  Serita Grammes, MD  Cardiologist:  Jenean Lindau, MD   Referring MD: Serita Grammes, MD    ASSESSMENT:    1. Angina pectoris ([place])   2. Diabetes mellitus due to underlying condition with unspecified complications ([place])   3. Atherosclerosis of native coronary artery of native heart without angina pectoris   4. Mixed dyslipidemia   5. Obstructive sleep apnea   6. Morbid obesity ([place])    PLAN:    In order of problems listed above:  1. Coronary artery disease: Secondary prevention stressed with the patient. Importance of compliance with diet medication stressed and she vocalized understanding: She is on guideline directed medical therapy. She is walking on a regular basis and I congratulated her about her weight loss also. Sublingual nitroglycerin prescription was sent, its protocol and 911 protocol explained and the patient vocalized understanding questions were answered to the patient's satisfaction 2. Mixed dyslipidemia: Diet was emphasized. She is on statin now and will have blood work done today as she is fasting. 3. Morbid obesity and diabetes mellitus: Diet was emphasized. Weight reduction was stressed and she has done a very nice job in the past couple of months doing well with diet and exercise. She is walking on a regular basis. 4. Obstructive sleep apnea: Sleep health issues were discussed 5. Patient will be seen in follow-up appointment in 6 months or earlier if the patient has any concerns. She is fasting and will have complete blood work done today.   Medication Adjustments/Labs and Tests Ordered: Current medicines are reviewed at length with the patient today.  Concerns regarding medicines are outlined above.  No orders of the defined types were placed in this encounter.  No orders of the defined types were placed in this encounter.    No chief complaint  on file.    History of Present Illness:    Melissa Richards is a 54 y.o. female. Patient has past medical history of mixed dyslipidemia and diabetes mellitus and morbid obesity. She has sleep apnea. CT scan revealed coronary angiography with nonobstructive disease and the details are mentioned below. She also has sarcoidosis and is aware of it. She denies any chest pain orthopnea or PND. She has lost weight and has started walking on a regular basis now. At the time of my evaluation, the patient is alert awake oriented and in no distress.  Past Medical History:  Diagnosis Date  . Adnexal mass    BILATERAL  . Angina pectoris ([place]) 04/29/2020  . Anxiety   . Arthritis   . Asthma   . Asthma, mild intermittent 08/12/2015  . Atypical chest pain 08/10/2018  . BMI 45.0-49.9, adult ([place]) 08/12/2015  . Constipation   . Depression   . Diabetes mellitus due to underlying condition with unspecified complications ([place]) 0/04/6225  . Diabetes mellitus without complication ([place])   . Elevated CA-125 07/28/2015  . Family history of adverse reaction to anesthesia    FAMILY MEMBERS ARE SLOW TO WAKE UP  . History of colonic polyps 03/17/2020  . HTN (hypertension) 07/28/2015  . Hypertension   . Mass, ovarian   . Morbid obesity with body mass index (BMI) of 40.0 to 44.9 in adult [facility]) 07/28/2015  . Numbness and tingling    FEET /LEGS  . Obstructive sleep apnea 07/28/2015  . ovarian ca dx'd 07/2015  . 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 Ovarian mass, left 07/28/2015  . Pelvic mass in female 09/04/2015  . Recurrent UTI   . Sarcoidosis 08/10/2018  . Shortness of breath 07/28/2015  . Sleep apnea    DOES NOT USE C -PAP  . Special screening examination for viral disease 03/17/2020  . Supraclavicular adenopathy   . Type II diabetes mellitus ([place]) 07/28/2015    Past Surgical History:  Procedure Laterality Date  . ABDOMINAL HYSTERECTOMY Bilateral 09/04/2015   Procedure: HYSTERECTOMY TOTAL  ABDOMINAL, EXPLORATORY LAPAROTOMY, BILATERAL SALPINGO OOPHERECTOMY, OMENTECTOMY, DEBULKING;  Surgeon: [name], MD;  Location: [facility]S;  Service: Gynecology;  Laterality: Bilateral;  . CARDIAC CATHETERIZATION     2 years ago  . TONSILLECTOMY    . TUBAL LIGATION      Current Medications: Current Meds  Medication Sig  . albuterol (PROVENTIL HFA;VENTOLIN HFA) 108 (90 BASE) MCG/ACT inhaler Inhale 1-2 puffs into the lungs every 6 (six) hours as needed for wheezing or shortness of breath. Reported on 02/23/2016  . aspirin EC 81 MG tablet Take 81 mg by mouth daily. Swallow whole.  Marland Kitchen atenolol (TENORMIN) 50 MG tablet Take 1 tablet (50 mg total) by mouth every morning.  Marland Kitchen atorvastatin (LIPITOR) 10 MG tablet Take 1 tablet (10 mg total) by mouth daily.  . budesonide-formoterol (SYMBICORT) 160-4.5 MCG/ACT inhaler Inhale 2 puffs into the lungs 2 (two) times daily. Reported on 02/23/2016  . buPROPion (WELLBUTRIN SR) 150 MG 12 hr tablet Take 150 mg by mouth daily.  . busPIRone (BUSPAR) 15 MG tablet Take 15 mg by mouth 2 (two) times daily.  Marland Kitchen levocetirizine (XYZAL) 5 MG tablet Take 5 mg by mouth every evening.  Marland Kitchen lisinopril (PRINIVIL,ZESTRIL) 20 MG tablet Take 20 mg by mouth daily.  . metFORMIN (GLUCOPHAGE) 500 MG tablet Take 500 mg by mouth daily with breakfast. Patient takes this medication twice a day.  . TRIAMTERENE-HCTZ PO Take by mouth daily.   . [DISCONTINUED] Multiple Vitamin (MULTIVITAMIN ADULT PO) Take 1 tablet by mouth daily.     Allergies:   Hydrocodone   Social History   Socioeconomic History  . Marital status: Married    Spouse name: Not on file  . Number of children: Not on file  . Years of education: Not on file  . Highest education level: Not on file  Occupational History  . Not on file  Tobacco Use  . Smoking status: Never Smoker  . Smokeless tobacco: Never Used  Vaping Use  . Vaping Use: Never used  Substance and Sexual Activity  . Alcohol use: No  . Drug use: No  .  Sexual activity: Not on file  Other Topics Concern  . Not on file  Social History Narrative  . Not on file   Social Determinants of Health   Financial Resource Strain:   . Difficulty of Paying Living Expenses: Not on file  Food Insecurity:   . Worried About Charity fundraiser in the Last Year: Not on file  . Ran Out of Food in the Last Year: Not on file  Transportation Needs:   . Lack of Transportation (Medical): Not on file  . Lack of Transportation (Non-Medical): Not on file  Physical Activity:   . Days of Exercise per Week: Not on file  . Minutes of Exercise per Session: Not on file  Stress:   . Feeling of Stress : Not on file  Social Connections:   . Frequency of Communication with Friends and Family: Not on file  .  Frequency of Social Gatherings with Friends and Family: Not on file  . Attends Religious Services: Not on file  . Active Member of Clubs or Organizations: Not on file  . Attends Archivist Meetings: Not on file  . Marital Status: Not on file     Family History: The patient's family history includes Anesthesia problems in her mother; Cancer in her father; Diabetes in her brother and mother; Hypertension in her mother.  ROS:   Please see the history of present illness.    All other systems reviewed and are negative.  EKGs/Labs/Other Studies Reviewed:    The following studies were reviewed today: HISTORY: 54 yo female with chest pain, nonspecific  EXAM: Cardiac/Coronary CTA  TECHNIQUE: The patient was scanned on a [employer].  PROTOCOL: A 120 kV prospective scan was triggered in the descending thoracic aorta at 111 HU's. Axial non-contrast 3 mm slices were carried out through the heart. The data set was analyzed on a dedicated work station and scored using the [place]. Gantry rotation speed was 250 msecs and collimation was .6 mm. Beta blockade and 0.8 mg of sl NTG was given. The 3D data set was reconstructed in 5%  intervals of the 67-82 % of the R-R cycle. Diastolic phases were analyzed on a dedicated work station using MPR, MIP and VRT modes. The patient received 31mL OMNIPAQUE IOHEXOL 350 MG/ML SOLN of contrast.  FINDINGS: Quality: Fair, HR 75, attenuation due to body habitus  Coronary calcium score: The patient's coronary artery calcium score is 1.83, which places the patient in the 74th percentile.  Coronary arteries: Normal coronary origins.  Right dominance.  Right Coronary Artery: Dominant. Gives off R-PDA and R-PLB branches. No disease.  Left Main Coronary Artery: Normal. Bifurcates into the LAD and LCx arteries.  Left Anterior Descending Coronary Artery: Normal moderate-sized artery that does not reach the apex. Minimal proximal D1 stenosis 1-24% (CADRADS1).  Left Circumflex Artery: AV groove vessel with single OM branch. No significant disease.  Aorta: Normal size, 29 mm at the mid ascending aorta (level of the PA bifurcation) measured double oblique. Aortic atherosclerosis. No dissection.  Aortic Valve: Trileaflet.  No calcifications.  Other findings:  Normal pulmonary vein drainage into the left atrium.  Normal left atrial appendage without a thrombus.  Normal size of the pulmonary artery.  Enlarged and calcified peri-hilar lymph nodes are noted. (See radiology report)  IMPRESSION: 1. Minimal non-obstructive CAD, CADRADS = 1.  2. Coronary calcium score of 1.83. This was 74th percentile for age and sex matched control.  3. Normal coronary origin with right dominance.  4. Enlarged and calcified perihilar lymph nodes c/w granulomatous disease such as sarcoidosis.   Electronically Signed   By: Pixie Casino M.D.   On: 05/20/2020 16:44   Recent Labs: 05/14/2020: BUN 12; Creatinine, Ser 0.97; Potassium 4.0; Sodium 137 05/28/2020: ALT 33  Recent Lipid Panel    Component Value Date/Time   CHOL 176 05/28/2020 0959   TRIG 169 (H)  05/28/2020 0959   HDL 30 (L) 05/28/2020 0959   CHOLHDL 5.9 (H) 05/28/2020 0959   LDLCALC 116 (H) 05/28/2020 0959    Physical Exam:    VS:  BP (!) 152/83   Pulse 70   Ht 5\' 2"  (1.575 m)   Wt 239 lb (108.4 kg)   SpO2 98%   BMI 43.71 kg/m     Wt Readings from Last 3 Encounters:  07/30/20 239 lb (108.4 kg)  04/29/20 (!) 256 lb (  116.1 kg)  03/06/20 257 lb 1.6 oz (116.6 kg)     GEN: Patient is in no acute distress HEENT: Normal NECK: No JVD; No carotid bruits LYMPHATICS: No lymphadenopathy CARDIAC: Hear sounds regular, 2/6 systolic murmur at the apex. RESPIRATORY:  Clear to auscultation without rales, wheezing or rhonchi  ABDOMEN: Soft, non-tender, non-distended MUSCULOSKELETAL:  No edema; No deformity  SKIN: Warm and dry NEUROLOGIC:  Alert and oriented x 3 PSYCHIATRIC:  Normal affect   Signed, Jenean Lindau, MD  07/30/2020 11:34 AM    [place]

## 2020-07-31 LAB — CBC WITH DIFFERENTIAL/PLATELET
Basophils Absolute: 0.1 10*3/uL (ref 0.0–0.2)
Basos: 1 %
EOS (ABSOLUTE): 0.5 10*3/uL — ABNORMAL HIGH (ref 0.0–0.4)
Eos: 6 %
Hematocrit: 45.4 % (ref 34.0–46.6)
Hemoglobin: 14.8 g/dL (ref 11.1–15.9)
Immature Grans (Abs): 0 10*3/uL (ref 0.0–0.1)
Immature Granulocytes: 0 %
Lymphocytes Absolute: 1.2 10*3/uL (ref 0.7–3.1)
Lymphs: 15 %
MCH: 29.3 pg (ref 26.6–33.0)
MCHC: 32.6 g/dL (ref 31.5–35.7)
MCV: 90 fL (ref 79–97)
Monocytes Absolute: 0.8 10*3/uL (ref 0.1–0.9)
Monocytes: 10 %
Neutrophils Absolute: 5.5 10*3/uL (ref 1.4–7.0)
Neutrophils: 68 %
Platelets: 223 10*3/uL (ref 150–450)
RBC: 5.05 x10E6/uL (ref 3.77–5.28)
RDW: 11.8 % (ref 11.7–15.4)
WBC: 8.1 10*3/uL (ref 3.4–10.8)

## 2020-07-31 LAB — LIPID PANEL
Chol/HDL Ratio: 3.9 ratio (ref 0.0–4.4)
Cholesterol, Total: 130 mg/dL (ref 100–199)
HDL: 33 mg/dL — ABNORMAL LOW (ref >39)
LDL Chol Calc (NIH): 71 mg/dL (ref 0–99)
Triglycerides: 147 mg/dL (ref 0–149)
VLDL Cholesterol Cal: 26 mg/dL (ref 5–40)

## 2020-07-31 LAB — BASIC METABOLIC PANEL
BUN/Creatinine Ratio: 13 (ref 9–23)
BUN: 13 mg/dL (ref 6–24)
CO2: 26 mmol/L (ref 20–29)
Calcium: 9.7 mg/dL (ref 8.7–10.2)
Chloride: 97 mmol/L (ref 96–106)
Creatinine, Ser: 1.01 mg/dL — ABNORMAL HIGH (ref 0.57–1.00)
GFR calc Af Amer: 72 mL/min/{1.73_m2} (ref >59)
GFR calc non Af Amer: 62 mL/min/{1.73_m2} (ref >59)
Glucose: 101 mg/dL — ABNORMAL HIGH (ref 65–99)
Potassium: 4.3 mmol/L (ref 3.5–5.2)
Sodium: 139 mmol/L (ref 134–144)

## 2020-07-31 LAB — HEPATIC FUNCTION PANEL
ALT: 28 IU/L (ref 0–32)
AST: 23 IU/L (ref 0–40)
Albumin: 4.5 g/dL (ref 3.8–4.9)
Alkaline Phosphatase: 68 IU/L (ref 44–121)
Bilirubin Total: 0.5 mg/dL (ref 0.0–1.2)
Bilirubin, Direct: 0.13 mg/dL (ref 0.00–0.40)
Total Protein: 6.3 g/dL (ref 6.0–8.5)

## 2020-07-31 LAB — TSH: TSH: 0.527 u[IU]/mL (ref 0.450–4.500)

## 2020-08-07 ENCOUNTER — Other Ambulatory Visit: Payer: Self-pay | Admitting: Cardiology

## 2020-08-07 MED ORDER — NITROGLYCERIN 0.4 MG SL SUBL: 0.4000 mg | SUBLINGUAL_TABLET | SUBLINGUAL | 11 refills | Status: DC | PRN

## 2020-08-07 NOTE — Telephone Encounter (Signed)
Refill sent in per request.  

## 2020-08-07 NOTE — Telephone Encounter (Signed)
   *  STAT* If patient is at the pharmacy, call can be transferred to refill team.   1. Which medications need to be refilled? (please list name of each medication and dose if known) nitroGLYCERIN (NITROSTAT) 0.4 MG SL tablet  2. Which pharmacy/location (including street and city if local pharmacy) is medication to be sent to?[id number] - [place], [place] - [address]  3. Do they need a 30 day or 90 day supply? 1 bottle   

## 2020-08-12 DIAGNOSIS — Z1231 Encounter for screening mammogram for malignant neoplasm of breast: Secondary | ICD-10-CM | POA: Diagnosis not present

## 2020-08-26 ENCOUNTER — Telehealth: Payer: Self-pay

## 2020-08-26 NOTE — Telephone Encounter (Signed)
Told ms Erichsen that the scan needs to be authorized prior to it being scheduled.  Sharyn Lull our scheduler will call her with the appointment at [facility] once she has it scheduled. Ms Hafen verbalized understanding.

## 2020-09-01 ENCOUNTER — Telehealth: Payer: Self-pay | Admitting: *Deleted

## 2020-09-01 NOTE — Telephone Encounter (Signed)
Called and scheduled the patient for a CT scan on 12/1 at 9 am. Patient to arrive at 8 am for labs. Patient given instructions to drink first bottle on contrast at 7 am and the second at 8 am, nothing to eat/drink after 5 am

## 2020-09-03 ENCOUNTER — Telehealth: Payer: Self-pay

## 2020-09-03 DIAGNOSIS — R590 Localized enlarged lymph nodes: Secondary | ICD-10-CM | POA: Diagnosis not present

## 2020-09-03 DIAGNOSIS — Z8543 Personal history of malignant neoplasm of ovary: Secondary | ICD-10-CM | POA: Diagnosis not present

## 2020-09-03 DIAGNOSIS — R918 Other nonspecific abnormal finding of lung field: Secondary | ICD-10-CM | POA: Diagnosis not present

## 2020-09-03 DIAGNOSIS — I700 Atherosclerosis of aorta: Secondary | ICD-10-CM | POA: Diagnosis not present

## 2020-09-03 DIAGNOSIS — Q8909 Congenital malformations of spleen: Secondary | ICD-10-CM | POA: Diagnosis not present

## 2020-09-03 DIAGNOSIS — K439 Ventral hernia without obstruction or gangrene: Secondary | ICD-10-CM | POA: Diagnosis not present

## 2020-09-03 DIAGNOSIS — D869 Sarcoidosis, unspecified: Secondary | ICD-10-CM | POA: Diagnosis not present

## 2020-09-03 DIAGNOSIS — D3910 Neoplasm of uncertain behavior of unspecified ovary: Secondary | ICD-10-CM | POA: Diagnosis not present

## 2020-09-03 NOTE — Telephone Encounter (Signed)
Faxed results from B-met done 07-30-20 to [facility] in [place] for patient's CT scan 09-03-20.  B-met has to be with in 60 days per Anderson Malta.

## 2020-09-10 DIAGNOSIS — R29810 Facial weakness: Secondary | ICD-10-CM | POA: Diagnosis not present

## 2020-09-10 DIAGNOSIS — G459 Transient cerebral ischemic attack, unspecified: Secondary | ICD-10-CM | POA: Diagnosis not present

## 2020-09-10 DIAGNOSIS — R200 Anesthesia of skin: Secondary | ICD-10-CM | POA: Diagnosis not present

## 2020-09-16 ENCOUNTER — Telehealth: Payer: Self-pay

## 2020-09-16 NOTE — Telephone Encounter (Signed)
Told ms Mcgourty that the scan showed no evidence of cancer recurrence. The lymph nodes and pulmonary nodules are stable per [name], NP.  Keep appointment for 09-25-20 as scheduled.

## 2020-09-17 ENCOUNTER — Telehealth: Payer: Self-pay | Admitting: *Deleted

## 2020-09-17 NOTE — Telephone Encounter (Signed)
Patient called and moved her appts from 12/23 to 12/30

## 2020-09-21 DIAGNOSIS — Z20828 Contact with and (suspected) exposure to other viral communicable diseases: Secondary | ICD-10-CM | POA: Diagnosis not present

## 2020-09-21 DIAGNOSIS — J349 Unspecified disorder of nose and nasal sinuses: Secondary | ICD-10-CM | POA: Diagnosis not present

## 2020-09-21 DIAGNOSIS — R519 Headache, unspecified: Secondary | ICD-10-CM | POA: Diagnosis not present

## 2020-09-25 ENCOUNTER — Ambulatory Visit: Payer: BC Managed Care – PPO | Admitting: Gynecologic Oncology

## 2020-09-25 ENCOUNTER — Other Ambulatory Visit: Payer: BC Managed Care – PPO

## 2020-09-29 NOTE — Progress Notes (Addendum)
Follow Up Note: Gyn-Onc  Melissa Richards 54 y.o. female  CC:  Chief Complaint  Patient presents with  . 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  Complete clinical response >5 years from surgery.  1/ Stage IIIC serous LMP of ovary: Will follow with CA 125's and annual CT scan of abd/pelvis and chest annually in Decmeber to monitor sarcoidosis nodules. 2/ depression: to continue treatment with her psychologist. 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  Follow-up in 12 months (December, 2022) with exam and CA 125 and CT scan, and if stable, will continue annual follow-up until December 2027.    HPI:  Melissa Richards is a 54 year old G2P2 initially seen at the request of Dr Carlena Bjornstad for a large left ovarian mass and elevated CA 125 on November 1st, 2016.  She reported a 2 month history of increasing left sided abdominal discomfort and constipation and presented to the [facility] ER on 07/17/15 with symptoms of pelvic swelling, left lower back and abdominal pain.  A CT of the abdomen and pelvis on 07/17/15 showed bibasilar nodularity, a subcarinal 1.4cm node suggestive of infrahilar adenopathy, multiple small retroperitoneal nodes (none pathologic by size criteria) including a 80mm aortocaval node and a 1.5cm gastrohepatic ligament node. There is a portacaval node measuring 2.5 x 3.7cm (image/series 21/2) and a 1.2cm right external iliac node.  There is soft tissue fullness extending superiorly off the right ovary (measuring 6x10cm) and a dominant left ovarian  cystic mass arising from the pelvis measuring 18 x 22.5cm.   A CA 125 was drawn on 07/23/15 and was elevated at 2782. (CEA was normal at 0.4).  Medical history significant for morbid obesity, OSA, asthma and non-cardiac shortness of breath. She underwent coronary catheterization 2 years ago for this, which was negative for blockage, and she did not require stenting, or antiplatelet therapy. She was referred to pulmonology who diagnosed her as having asthma. She is a diabetic.  A Chest Ct on 07/30/15 Revealed: 1. Bilateral small pulmonary nodules are concerning for pulmonary metastasis. 2. Mild mediastinal and bilateral hilar adenopathy is also concerning for metastatic disease. 3. Mildly enlarged LEFT supraclavicular lymph node is concerning for metastatic disease. This lymph node may be amenable to ultrasound-guided biopsy 4. Enlargement of the LEFT lobe of thyroid gland likely represents benign goiter.    Due to the findings of unresectable chest and upper abdominal disease, she was scheduled for neoadjuvant chemotherapy following histologic confirmation via biopsy.  Biopsy of the supraclavicular node on 08/05/15 revealed FOCAL CLUSTERS WITH GRANULOMAS, SEE COMMENT. LYMPHOID TISSUE PRESENT. NO MALIGNANT CELLS IDENTIFIED.  Paracentesis of the ascites and biopsy of the pelvic soft tissue mass on 08/18/15 revealed SEROUS TUMOR WITH FOCAL PROLIFERATION, with the ascites showing papillary fragments of serous tumor (at least LMP).  Due to these inconclusive findings for malignancy and a concern that this is a low malignant potential tumor which would not be responsive to chemotherapy, chemotherapy was cancelled and surgery was scheduled. If this disease is LMP, it is likely that the chest and upper abdominal adenopathy is non-malignant.  On 09/04/15, she underwent an exploratory  laparotomy with TAH, bilateral salpingo-oophorectomy, omentectomy radical tumor debulking for ovarian cancer. 22 modifier for extreme  intraperitoneal obesity making surgical exposure more complex and increasing duration of procedure by 1 hour.  Final pathology revealed: 1. 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 - NON-INVASIVE IMPLANT ON FALLOPIAN TUBE SEROSA. 2. Adnexa - ovary +/- tube, neoplastic, right - SEROUS BORDERLINE TUMOR, 8 CM WITH OVARIAN SURFACE INVOLVEMENT. - NON-INVASIVE IMPLANT ON FALLOPIAN TUBE SEROSA. 3. Uterus and cervix - CERVIX: NABOTHIAN CYST. - ENDOMETRIUM: INACTIVE. NO EVIDENCE OF HYPERPLASIA OR CARCINOMA. - MYOMETRIUM: LEIOMYOMA. - UTERINE SEROSA AND ATTACHED SOFT TISSUE: NON-INVASIVE IMPLANTS OF SEROUS BORDERLINE TUMOR. 4. Omentum, resection for tumor - MICROSCOPIC NON-INVASIVE IMPLANT OF SEROUS BORDERLINE TUMOR. - FOCI OF INFLAMMATION AND HYPEREMIA. 5. Soft tissue mass, simple excision, sigmoid - SEROUS BORDERLINE TUMOR, 8 CM CONSISTENT WITH NON-INVASIVE IMPLANT.   She had repeat imaging with CT chest on 02/23/16 and MR abdomen on 02/23/16. C CT of the chest showed stable supraclavicular and hilar lymphadenopathy consistent with a diagnosis of sarcoidosis and unlikely to be reflective of metastatic borderline tumor The MRI of the abdomen demonstrated stable porta hepatis nodes also consistent with likely sarcoidosis and not metastatic disease. However a new enhancing 1 cm nodule was identified in the right paracolic gutter that was slightly increased since the February 2017 MRI could not exclude recurrent tumor implant. There was no other evidence of peritoneal metastases or ascites.  Repeat MRI in August 2017 showed complete resolution of the right paracolic gutter implant. CA 125 on 05/26/16 was stable and normal at 23.  CA 125 on 08/09/16 was stable at 29.  CA 125 on 02/19/17 was stable at 20.6. MRI abdomen on 05/18/17 showed no metastatic disease, stable lymphadenopathy consistent with chronic inflammatory process.  CA 125 on 09/19/17  was stable at 21.7.  CA 125 on 03/24/18 was stable at 22.   MRI abdomen on 04/03/18 showed stably enlarged retroperitoneal adenopathy in the abdomen and pelvis and chronic hilar adenopathy and scattered pulmonary nodules felt to be due to sarcoidosis.  They recommended follow-up CT scan of the abdomen pelvis with IV contrast in 6 months to evaluate the retroperitoneal adenopathy for stability.  The 1.3 cm right paracolic gutter nodule was stable and unchanged since 2018 and was presumed to be benign.  CA 125 on 09/08/18 was normal at 18.6.   She is being worked up for possible cardiac sarcoidosis but this is an incomplete work-up due to her anxiety with proceeding with testing. She had an echo in January which she states was "good".  She had a CT abd/pelvis on 10/13/18 a stable exam with no new or progressive findings.  There is a stable appearance of multiple bilateral pulmonary nodules.  Hepatic steatosis was again appreciated.  Aortic atherosclerosis was seen.  There were no abdominal or pelvic nodules or carcinomatosis seen.  CA 125 in 09/04/19 was normal at 19.4.   Interval History:   CA 125 in 03/06/20 was normal at 19.4.  CT abd/pelvis on 09/02/20 showed no evidence of recurrent disease, she had stable mildly enlarged gastrohepatic lymph nodes and stable pulmonary nodules.  CA 125 on 10/02/20 was pending.   She has no symptoms of recurrence.   Review of Systems  Constitutional: Feels physically well  Denies fever, chills, early satiety, unintentional weight loss or gain.  Cardiovascular: No chest pain, shortness of breath, or edema.  Pulmonary: No cough or wheeze.  Gastrointestinal: No  nausea, vomiting, or diarrhea. No bright red blood per rectum or change in bowel movement. Genitourinary: No frequency, urgency, or dysuria. No vaginal bleeding or discharge.  Musculoskeletal: No myalgia or joint pain. Neurologic: No weakness, numbness, or change in gait.  Psychology: +  depression.  Current Meds:  Outpatient Encounter Medications as of 10/02/2020  Medication Sig  . albuterol (PROVENTIL HFA;VENTOLIN HFA) 108 (90 BASE) MCG/ACT inhaler Inhale 1-2 puffs into the lungs every 6 (six) hours as needed for wheezing or shortness of breath. Reported on 02/23/2016  . aspirin EC 81 MG tablet Take 81 mg by mouth daily. Swallow whole.  Marland Kitchen atenolol (TENORMIN) 50 MG tablet Take 1 tablet (50 mg total) by mouth every morning.  . budesonide-formoterol (SYMBICORT) 160-4.5 MCG/ACT inhaler Inhale 2 puffs into the lungs 2 (two) times daily. Reported on 02/23/2016  . buPROPion (WELLBUTRIN SR) 150 MG 12 hr tablet Take 150 mg by mouth daily.  . busPIRone (BUSPAR) 15 MG tablet Take 15 mg by mouth 2 (two) times daily.  Marland Kitchen levocetirizine (XYZAL) 5 MG tablet Take 5 mg by mouth every evening.  Marland Kitchen lisinopril (PRINIVIL,ZESTRIL) 20 MG tablet Take 20 mg by mouth daily.  . metFORMIN (GLUCOPHAGE) 500 MG tablet Take 500 mg by mouth daily with breakfast. Patient takes this medication twice a day.  . nitroGLYCERIN (NITROSTAT) 0.4 MG SL tablet Place 1 tablet (0.4 mg total) under the tongue every 5 (five) minutes as needed.  . TRIAMTERENE-HCTZ PO Take by mouth daily.   Marland Kitchen atorvastatin (LIPITOR) 10 MG tablet Take 1 tablet (10 mg total) by mouth daily.   No facility-administered encounter medications on file as of 10/02/2020.    Allergy:  Allergies  Allergen Reactions  . Hydrocodone Itching    Can only take w benadryl    Social Hx:   Social History   Socioeconomic History  . Marital status: Married    Spouse name: Not on file  . Number of children: Not on file  . Years of education: Not on file  . Highest education level: Not on file  Occupational History  . Not on file  Tobacco Use  . Smoking status: Never Smoker  . Smokeless tobacco: Never Used  Vaping Use  . Vaping Use: Never used  Substance and Sexual Activity  . Alcohol use: No  . Drug use: No  . Sexual activity: Not on file   Other Topics Concern  . Not on file  Social History Narrative  . Not on file   Social Determinants of Health   Financial Resource Strain: Not on file  Food Insecurity: Not on file  Transportation Needs: Not on file  Physical Activity: Not on file  Stress: Not on file  Social Connections: Not on file  Intimate Partner Violence: Not on file    Past Surgical Hx:  Past Surgical History:  Procedure Laterality Date  . ABDOMINAL HYSTERECTOMY Bilateral 09/04/2015   Procedure: HYSTERECTOMY TOTAL ABDOMINAL, EXPLORATORY LAPAROTOMY, BILATERAL SALPINGO OOPHERECTOMY, OMENTECTOMY, DEBULKING;  Surgeon: [name], MD;  Location: [facility];  Service: Gynecology;  Laterality: Bilateral;  . CARDIAC CATHETERIZATION     2 years ago  . TONSILLECTOMY    . TUBAL LIGATION      Past Medical Hx:  Past Medical History:  Diagnosis Date  . Adnexal mass    BILATERAL  . Angina pectoris ([place]) 04/29/2020  . Anxiety   . Arthritis   . Asthma   . Asthma, mild intermittent 08/12/2015  . Atypical chest pain 08/10/2018  .  BMI 45.0-49.9, adult ([facility]) 08/12/2015  . Constipation   . Depression   . Diabetes mellitus due to underlying condition with unspecified complications ([facility]) 04/29/2020  . Diabetes mellitus without complication ([facility])   . Elevated CA-125 07/28/2015  . Family history of adverse reaction to anesthesia    FAMILY MEMBERS ARE SLOW TO WAKE UP  . History of colonic polyps 03/17/2020  . HTN (hypertension) 07/28/2015  . Hypertension   . Mass, ovarian   . Morbid obesity with body mass index (BMI) of 40.0 to 44.9 in adult [facility]) 07/28/2015  . Numbness and tingling    FEET /LEGS  . Obstructive sleep apnea 07/28/2015  . ovarian ca dx'd 07/2015  . 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 Ovarian mass, left 07/28/2015  . Pelvic mass in female 09/04/2015  . Recurrent UTI   . Sarcoidosis 08/10/2018  . Shortness of breath 07/28/2015  . Sleep apnea    DOES NOT USE C -PAP  . Special screening  examination for viral disease 03/17/2020  . Supraclavicular adenopathy   . Type II diabetes mellitus (HCC) 07/28/2015    Family Hx:  Family History  Problem Relation Age of Onset  . Anesthesia problems Mother   . Diabetes Mother   . Hypertension Mother   . Cancer Father   . Diabetes Brother     Vitals:  Blood pressure 122/80, pulse 76, temperature 98.2 F (36.8 C), temperature source Tympanic, resp. rate 16, height 5\' 2"  (1.575 m), weight 235 lb 14.4 oz (107 kg), SpO2 99 %.  Physical Exam:  General: Well developed, well nourished female in no acute distress. Alert and oriented x 3.  Cardiovascular: Regular rate and rhythm. S1 and S2 normal.  Lungs: Clear to auscultation bilaterally. No wheezes/crackles/rhonchi noted.  Skin: No rashes or lesions present. Back: No CVA tenderness.  Genitourinary: vaginal cuff well healed, no bleeding or discharge.No pelvic masses. Speculum exam revealed no blood.  Rectal: no palpable masses or lesions. Abdomen: Abdomen soft, non-tender and morbidly obese. Wound completely healed. Extremities: No bilateral cyanosis, edema, or clubbing.    , MD  10/02/2020, 3:15 PM

## 2020-10-02 ENCOUNTER — Inpatient Hospital Stay: Payer: BC Managed Care – PPO | Attending: Gynecologic Oncology

## 2020-10-02 ENCOUNTER — Encounter: Payer: Self-pay | Admitting: Gynecologic Oncology

## 2020-10-02 ENCOUNTER — Inpatient Hospital Stay (HOSPITAL_BASED_OUTPATIENT_CLINIC_OR_DEPARTMENT_OTHER): Payer: BC Managed Care – PPO | Admitting: Gynecologic Oncology

## 2020-10-02 ENCOUNTER — Other Ambulatory Visit: Payer: Self-pay

## 2020-10-02 VITALS — BP 122/80 | HR 76 | Temp 98.2°F | Resp 16 | Ht 62.0 in | Wt 235.9 lb

## 2020-10-02 DIAGNOSIS — E119 Type 2 diabetes mellitus without complications: Secondary | ICD-10-CM | POA: Diagnosis not present

## 2020-10-02 DIAGNOSIS — Z90722 Acquired absence of ovaries, bilateral: Secondary | ICD-10-CM | POA: Diagnosis not present

## 2020-10-02 DIAGNOSIS — R918 Other nonspecific abnormal finding of lung field: Secondary | ICD-10-CM | POA: Diagnosis not present

## 2020-10-02 DIAGNOSIS — D3910 Neoplasm of uncertain behavior of unspecified ovary: Secondary | ICD-10-CM | POA: Insufficient documentation

## 2020-10-02 DIAGNOSIS — I209 Angina pectoris, unspecified: Secondary | ICD-10-CM | POA: Insufficient documentation

## 2020-10-02 DIAGNOSIS — Z90710 Acquired absence of both cervix and uterus: Secondary | ICD-10-CM | POA: Diagnosis not present

## 2020-10-02 DIAGNOSIS — Z6841 Body Mass Index (BMI) 40.0 and over, adult: Secondary | ICD-10-CM | POA: Insufficient documentation

## 2020-10-02 LAB — BASIC METABOLIC PANEL
Anion gap: 6 (ref 5–15)
BUN: 12 mg/dL (ref 6–20)
CO2: 32 mmol/L (ref 22–32)
Calcium: 9.4 mg/dL (ref 8.9–10.3)
Chloride: 99 mmol/L (ref 98–111)
Creatinine, Ser: 1.08 mg/dL — ABNORMAL HIGH (ref 0.44–1.00)
GFR, Estimated: >60 mL/min (ref >60)
Glucose, Bld: 212 mg/dL — ABNORMAL HIGH (ref 70–99)
Potassium: 4 mmol/L (ref 3.5–5.1)
Sodium: 137 mmol/L (ref 135–145)

## 2020-10-02 NOTE — Patient Instructions (Signed)
Plan to follow in December 2022 with a scan before. If you will call our office when it is closer to the date (Sept or Oct 2022) we will schedule your scan and your appointment. Please call the office for any new, persistent symptoms.

## 2020-10-03 LAB — CA 125: Cancer Antigen (CA) 125: 20.7 U/mL (ref 0.0–38.1)

## 2020-10-09 ENCOUNTER — Telehealth: Payer: Self-pay

## 2020-10-09 NOTE — Telephone Encounter (Signed)
Told Ms Yearwood that the CA-125 was good at 20.7 as noted below by Warner Mccreedy, NP. Pt verbalized understanding.

## 2020-10-09 NOTE — Telephone Encounter (Signed)
-----   Message from Doylene Bode, NP sent at 10/06/2020  1:08 PM EST ----- Please let her know her CA 125 is stable and within normal range.

## 2020-11-04 ENCOUNTER — Ambulatory Visit: Payer: BC Managed Care – PPO | Admitting: Cardiology

## 2020-11-04 ENCOUNTER — Other Ambulatory Visit: Payer: Self-pay

## 2020-11-04 ENCOUNTER — Encounter: Payer: Self-pay | Admitting: Cardiology

## 2020-11-04 VITALS — BP 152/82 | HR 88 | Ht 62.0 in | Wt 238.6 lb

## 2020-11-04 DIAGNOSIS — E088 Diabetes mellitus due to underlying condition with unspecified complications: Secondary | ICD-10-CM | POA: Diagnosis not present

## 2020-11-04 DIAGNOSIS — I10 Essential (primary) hypertension: Secondary | ICD-10-CM | POA: Diagnosis not present

## 2020-11-04 DIAGNOSIS — I2510 Atherosclerotic heart disease of native coronary artery without angina pectoris: Secondary | ICD-10-CM | POA: Diagnosis not present

## 2020-11-04 DIAGNOSIS — G4733 Obstructive sleep apnea (adult) (pediatric): Secondary | ICD-10-CM

## 2020-11-04 DIAGNOSIS — D869 Sarcoidosis, unspecified: Secondary | ICD-10-CM

## 2020-11-04 DIAGNOSIS — Z6841 Body Mass Index (BMI) 40.0 and over, adult: Secondary | ICD-10-CM | POA: Diagnosis not present

## 2020-11-04 NOTE — Progress Notes (Signed)
Cardiology Office Note:    Date:  11/04/2020   ID:  Melissa Richards, DOB 25-Jun-1966, MRN [id number]  PCP:  Serita Grammes, MD  Cardiologist:  Jenean Lindau, MD   Referring MD: Serita Grammes, MD    ASSESSMENT:    1. Atherosclerosis of native coronary artery of native heart without angina pectoris   2. Primary hypertension   3. BMI 45.0-49.9, adult ([place])   4. Diabetes mellitus due to underlying condition with unspecified complications ([place])   5. Obstructive sleep apnea   6. Sarcoidosis    PLAN:    In order of problems listed above:  1. Coronary arteriosclerosis: Secondary prevention stressed with the patient.  Importance of compliance with diet medication stressed and she vocalized understanding. 2. Sleep apnea: Sleep health issues were discussed.  She has not compliant in following up with sleep health instructions.  I told her to get a referral to a sleep specialist for assessing this aspect of her care. 3. Essential hypertension: Blood pressure stable and she mentioned blood pressure readings at home and they are fine.  Salt intake issues and lifestyle modification was urged and she vocalized understanding. 4. Diabetes mellitus, morbid obesity: Diet was emphasized weight reduction was stressed and she promises to do better. 5. Sarcoidosis: Followed by primary care physician 6. Mixed dyslipidemia: Lipids followed by primary care physician.  KPN sheet was noted and she will be having blood work in the next few weeks and will send [name] a copy. 7. Patient will be seen in follow-up appointment in 6 months or earlier if the patient has any concerns   Medication Adjustments/Labs and Tests Ordered: Current medicines are reviewed at length with the patient today.  Concerns regarding medicines are outlined above.  No orders of the defined types were placed in this encounter.  No orders of the defined types were placed in this encounter.    No chief complaint on file.    History  of Present Illness:    Melissa Richards is a 55 y.o. female.  Patient has past medical history of elevated calcium score, coronary atherosclerosis essential hypertension dyslipidemia sleep apnea and morbid obesity.  She denies any problems at this time and takes care of activities of daily living.  No chest pain orthopnea or PND.  She is trying to walk on a regular basis.  She also has sarcoidosis.  At the time of my evaluation, the patient is alert awake oriented and in no distress.  Past Medical History:  Diagnosis Date  . Adnexal mass    BILATERAL  . Angina pectoris ([place]) 04/29/2020  . Anxiety   . Arthritis   . Asthma   . Asthma, mild intermittent 08/12/2015  . Atypical chest pain 08/10/2018  . BMI 45.0-49.9, adult ([place]) 08/12/2015  . Constipation   . Coronary atherosclerosis 07/30/2020  . Depression   . Diabetes mellitus due to underlying condition with unspecified complications ([place]) 0/34/7425  . Diabetes mellitus without complication ([place])   . Elevated CA-125 07/28/2015  . Family history of adverse reaction to anesthesia    FAMILY MEMBERS ARE SLOW TO WAKE UP  . History of colonic polyps 03/17/2020  . HTN (hypertension) 07/28/2015  . Hypertension   . Mass, ovarian   . Mixed dyslipidemia 07/30/2020  . Morbid obesity ([place]) 07/30/2020  . Morbid obesity with body mass index (BMI) of 40.0 to 44.9 in adult [facility]) 07/28/2015  . Numbness and tingling    FEET /LEGS  . Obstructive sleep apnea 07/28/2015  .  ovarian ca dx'd 07/2015  . 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 Ovarian mass, left 07/28/2015  . Pelvic mass in female 09/04/2015  . Recurrent UTI   . Sarcoidosis 08/10/2018  . Shortness of breath 07/28/2015  . Sleep apnea    DOES NOT USE C -PAP  . Special screening examination for viral disease 03/17/2020  . Supraclavicular adenopathy   . Type II diabetes mellitus ([place]) 07/28/2015    Past Surgical History:  Procedure Laterality Date  . ABDOMINAL HYSTERECTOMY Bilateral  09/04/2015   Procedure: HYSTERECTOMY TOTAL ABDOMINAL, EXPLORATORY LAPAROTOMY, BILATERAL SALPINGO OOPHERECTOMY, OMENTECTOMY, DEBULKING;  Surgeon: [name], MD;  Location: [facility];  Service: Gynecology;  Laterality: Bilateral;  . CARDIAC CATHETERIZATION     2 years ago  . TONSILLECTOMY    . TUBAL LIGATION      Current Medications: Current Meds  Medication Sig  . budesonide-formoterol (SYMBICORT) 160-4.5 MCG/ACT inhaler Inhale 2 puffs into the lungs as needed (cough).  . busPIRone (BUSPAR) 15 MG tablet Take 15 mg by mouth 3 (three) times daily.  . metFORMIN (GLUCOPHAGE) 500 MG tablet Take 500 mg by mouth 2 (two) times daily with a meal.  . nitroGLYCERIN (NITROSTAT) 0.4 MG SL tablet Place 0.4 mg under the tongue every 5 (five) minutes as needed for chest pain.  . TRIAMTERENE-HCTZ PO Take 1 tablet by mouth daily.     Allergies:   Hydrocodone   Social History   Socioeconomic History  . Marital status: Married    Spouse name: Not on file  . Number of children: Not on file  . Years of education: Not on file  . Highest education level: Not on file  Occupational History  . Not on file  Tobacco Use  . Smoking status: Never Smoker  . Smokeless tobacco: Never Used  Vaping Use  . Vaping Use: Never used  Substance and Sexual Activity  . Alcohol use: No  . Drug use: No  . Sexual activity: Not on file  Other Topics Concern  . Not on file  Social History Narrative  . Not on file   Social Determinants of Health   Financial Resource Strain: Not on file  Food Insecurity: Not on file  Transportation Needs: Not on file  Physical Activity: Not on file  Stress: Not on file  Social Connections: Not on file     Family History: The patient's family history includes Anesthesia problems in her mother; Cancer in her father; Diabetes in her brother and mother; Hypertension in her mother.  ROS:   Please see the history of present illness.    All other systems reviewed and are  negative.  EKGs/Labs/Other Studies Reviewed:    The following studies were reviewed today: I discussed my findings with the patient at length   Recent Labs: 07/30/2020: ALT 28; Hemoglobin 14.8; Platelets 223; TSH 0.527 10/02/2020: BUN 12; Creatinine, Ser 1.08; Potassium 4.0; Sodium 137  Recent Lipid Panel    Component Value Date/Time   CHOL 130 07/30/2020 1143   TRIG 147 07/30/2020 1143   HDL 33 (L) 07/30/2020 1143   CHOLHDL 3.9 07/30/2020 1143   LDLCALC 71 07/30/2020 1143    Physical Exam:    VS:  BP (!) 152/82   Pulse 88   Ht 5\' 2"  (1.575 m)   Wt 238 lb 9.6 oz (108.2 kg)   SpO2 95%   BMI 43.64 kg/m     Wt Readings from Last 3 Encounters:  11/04/20 238 lb 9.6 oz (108.2  kg)  10/02/20 235 lb 14.4 oz (107 kg)  07/30/20 239 lb (108.4 kg)     GEN: Patient is in no acute distress HEENT: Normal NECK: No JVD; No carotid bruits LYMPHATICS: No lymphadenopathy CARDIAC: Hear sounds regular, 2/6 systolic murmur at the apex. RESPIRATORY:  Clear to auscultation without rales, wheezing or rhonchi  ABDOMEN: Soft, non-tender, non-distended MUSCULOSKELETAL:  No edema; No deformity  SKIN: Warm and dry NEUROLOGIC:  Alert and oriented x 3 PSYCHIATRIC:  Normal affect   Signed, Jenean Lindau, MD  11/04/2020 1:29 PM    [facility]

## 2020-11-04 NOTE — Patient Instructions (Signed)

## 2020-11-15 ENCOUNTER — Other Ambulatory Visit: Payer: Self-pay | Admitting: Cardiology

## 2020-11-17 NOTE — Telephone Encounter (Signed)
Rx refill sent to pharmacy. 

## 2020-12-19 DIAGNOSIS — D862 Sarcoidosis of lung with sarcoidosis of lymph nodes: Secondary | ICD-10-CM | POA: Diagnosis not present

## 2020-12-19 DIAGNOSIS — Z6841 Body Mass Index (BMI) 40.0 and over, adult: Secondary | ICD-10-CM | POA: Diagnosis not present

## 2020-12-19 DIAGNOSIS — Z13220 Encounter for screening for lipoid disorders: Secondary | ICD-10-CM | POA: Diagnosis not present

## 2020-12-19 DIAGNOSIS — Z01419 Encounter for gynecological examination (general) (routine) without abnormal findings: Secondary | ICD-10-CM | POA: Diagnosis not present

## 2020-12-19 DIAGNOSIS — Z131 Encounter for screening for diabetes mellitus: Secondary | ICD-10-CM | POA: Diagnosis not present

## 2021-01-30 ENCOUNTER — Institutional Professional Consult (permissible substitution): Payer: BC Managed Care – PPO | Admitting: Pulmonary Disease

## 2021-02-23 ENCOUNTER — Encounter: Payer: Self-pay | Admitting: Pulmonary Disease

## 2021-02-23 ENCOUNTER — Ambulatory Visit: Payer: BC Managed Care – PPO | Admitting: Pulmonary Disease

## 2021-02-23 ENCOUNTER — Other Ambulatory Visit: Payer: Self-pay

## 2021-02-23 VITALS — BP 146/86 | HR 91 | Temp 98.0°F | Ht 62.0 in | Wt 242.0 lb

## 2021-02-23 DIAGNOSIS — G4733 Obstructive sleep apnea (adult) (pediatric): Secondary | ICD-10-CM | POA: Diagnosis not present

## 2021-02-23 DIAGNOSIS — D869 Sarcoidosis, unspecified: Secondary | ICD-10-CM | POA: Diagnosis not present

## 2021-02-23 MED ORDER — BUDESONIDE-FORMOTEROL FUMARATE 160-4.5 MCG/ACT IN AERO: 2.0000 | INHALATION_SPRAY | Freq: Two times a day (BID) | RESPIRATORY_TRACT | 12 refills | Status: DC

## 2021-02-23 NOTE — Progress Notes (Signed)
@Patient  ID: Melissa Richards, female    DOB: [birth year], 55 y.o.   MRN: [id number]  Chief Complaint  Patient presents with  . Consult    Some sob, with productive cough and clear mucus    Referring provider: Serita Grammes, MD  HPI:   55 year old whom we are seeing in consultation for evaluation of sarcoidosis.  Note from referring provider reviewed.  Most recent surgical oncology note reviewed.  Most recent cardiology note reviewed.  Patient notes a several year history of dyspnea on exertion.  May be worsening over the last several months to year.  Worse with inclines or stairs but present on flat surfaces as well.  Has good days and bad days.  Not initially worse with change in season of pollens.  No timing during the day with things are better or worse.  No positional changes that make things better or worse.  Does not report any change in environment in terms of new house, remodeling, flooding etc. that may contribute to symptoms.  She does use albuterol and this seems to help intermittently with her shortness of breath.  She has been prescribed Symbicort but does not pick it up or using it.  Was using it at 1 time as needed and found to be somewhat helpful.  Reviewed serial CT scans most recent coronary CT 05/2020 on my review demonstrates stable nodular densities in bilateral lungs compared to CT chest in 2016 that I reviewed interpreted as the same.  She had a left supraclavicular lymph node biopsy 08/05/2015 that revealed focal cluster of given granulomas with lymphoid tissue present no malignant cells.  Cytology results reviewed.  TTE 09/2018 reviewed with mild mitral valve regurgitation, normal LA size, normal EF, unable to comment on diastolic function.  She reports routine eye checks with her eye care provider.  PMH: Hypertension, asthma, diabetes, seasonal allergies, OSA, ovarian cancer status post surgical resection 09/04/2015 Surgical history: Hysterectomy Family history:  She reports first-degree relatives with emphysema and asthma as well as coronary artery disease Social history: Never smoker, lives with husband, lives in [place] / Pulmonary Flowsheets:   ACT:  No flowsheet data found.  MMRC: No flowsheet data found.  Epworth:  No flowsheet data found.  Tests:   FENO:  No results found for: NITRICOXIDE  PFT: No flowsheet data found.  WALK:  No flowsheet data found.  Imaging: Personally reviewed and as per EMR discussion this note  Lab Results: Personally reviewed, no anemia, eosinophils elevated to 500 07/2020 on most recent check CBC    Component Value Date/Time   WBC 8.1 07/30/2020 1143   WBC 8.7 05/18/2017 1417   WBC 12.2 (H) 09/06/2015 1050   RBC 5.05 07/30/2020 1143   RBC 4.85 05/18/2017 1417   RBC 3.27 (L) 09/06/2015 1050   HGB 14.8 07/30/2020 1143   HGB 14.2 05/18/2017 1417   HCT 45.4 07/30/2020 1143   HCT 42.4 05/18/2017 1417   PLT 223 07/30/2020 1143   MCV 90 07/30/2020 1143   MCV 87.4 05/18/2017 1417   MCH 29.3 07/30/2020 1143   MCH 29.2 05/18/2017 1417   MCH 25.4 (L) 09/06/2015 1050   MCHC 32.6 07/30/2020 1143   MCHC 33.4 05/18/2017 1417   MCHC 30.9 09/06/2015 1050   RDW 11.8 07/30/2020 1143   RDW 13.0 05/18/2017 1417   LYMPHSABS 1.2 07/30/2020 1143   LYMPHSABS 1.3 05/18/2017 1417   MONOABS 0.8 05/18/2017 1417   EOSABS 0.5 (H) 07/30/2020 1143   BASOSABS  0.1 07/30/2020 1143   BASOSABS 0.1 05/18/2017 1417    BMET    Component Value Date/Time   NA 137 10/02/2020 1331   NA 139 07/30/2020 1143   NA 138 05/18/2017 1417   K 4.0 10/02/2020 1331   K 3.9 05/18/2017 1417   CL 99 10/02/2020 1331   CO2 32 10/02/2020 1331   CO2 26 05/18/2017 1417   GLUCOSE 212 (H) 10/02/2020 1331   GLUCOSE 205 (H) 05/18/2017 1417   BUN 12 10/02/2020 1331   BUN 13 07/30/2020 1143   BUN 11.4 05/18/2017 1417   CREATININE 1.08 (H) 10/02/2020 1331   CREATININE 0.96 10/13/2018 1023   CREATININE 0.9 05/18/2017  1417   CALCIUM 9.4 10/02/2020 1331   CALCIUM 9.7 05/18/2017 1417   GFRNONAA >60 10/02/2020 1331   GFRNONAA >60 10/13/2018 1023   GFRAA 72 07/30/2020 1143   GFRAA >60 10/13/2018 1023    BNP No results found for: BNP  ProBNP No results found for: PROBNP  Specialty Problems      Pulmonary Problems   Asthma   Obstructive sleep apnea   Shortness of breath   Asthma, mild intermittent   Sleep apnea    DOES NOT USE C -PAP         Allergies  Allergen Reactions  . Hydrocodone Itching    Can only take w benadryl    Immunization History  Administered Date(s) Administered  . Influenza,inj,Quad PF,6+ Mos 08/12/2015    Past Medical History:  Diagnosis Date  . Adnexal mass    BILATERAL  . Angina pectoris ([place]) 04/29/2020  . Anxiety   . Arthritis   . Asthma   . Asthma, mild intermittent 08/12/2015  . Atypical chest pain 08/10/2018  . BMI 45.0-49.9, adult ([place]) 08/12/2015  . Constipation   . Coronary atherosclerosis 07/30/2020  . Depression   . Diabetes mellitus due to underlying condition with unspecified complications ([place]) 05/12/9832  . Diabetes mellitus without complication ([place])   . Elevated CA-125 07/28/2015  . Family history of adverse reaction to anesthesia    FAMILY MEMBERS ARE SLOW TO WAKE UP  . History of colonic polyps 03/17/2020  . HTN (hypertension) 07/28/2015  . Hypertension   . Mass, ovarian   . Mixed dyslipidemia 07/30/2020  . Morbid obesity ([place]) 07/30/2020  . Morbid obesity with body mass index (BMI) of 40.0 to 44.9 in adult [facility]) 07/28/2015  . Numbness and tingling    FEET /LEGS  . Obstructive sleep apnea 07/28/2015  . ovarian ca dx'd 07/2015  . 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 Ovarian mass, left 07/28/2015  . Pelvic mass in female 09/04/2015  . Recurrent UTI   . Sarcoidosis 08/10/2018  . Shortness of breath 07/28/2015  . Sleep apnea    DOES NOT USE C -PAP  . Special screening examination for viral disease 03/17/2020  .  Supraclavicular adenopathy   . Type II diabetes mellitus ([place]) 07/28/2015    Tobacco History: Social History   Tobacco Use  Smoking Status Never Smoker  Smokeless Tobacco Never Used   Counseling given: Not Answered   Continue to not smoke  Outpatient Encounter Medications as of 02/23/2021  Medication Sig  . albuterol (PROVENTIL HFA;VENTOLIN HFA) 108 (90 BASE) MCG/ACT inhaler Inhale 1-2 puffs into the lungs every 6 (six) hours as needed for wheezing or shortness of breath. Reported on 02/23/2016  . aspirin EC 81 MG tablet Take 81 mg by mouth daily. Swallow whole.  Marland Kitchen atenolol (TENORMIN) 50 MG  tablet Take 1 tablet (50 mg total) by mouth every morning.  Marland Kitchen atorvastatin (LIPITOR) 10 MG tablet TAKE 1 TABLET BY MOUTH EVERY DAY  . budesonide-formoterol (SYMBICORT) 160-4.5 MCG/ACT inhaler Inhale 2 puffs into the lungs in the morning and at bedtime.  . busPIRone (BUSPAR) 15 MG tablet Take 15 mg by mouth 3 (three) times daily.  Marland Kitchen lisinopril (PRINIVIL,ZESTRIL) 20 MG tablet Take 20 mg by mouth daily.  . metFORMIN (GLUCOPHAGE) 500 MG tablet Take 500 mg by mouth 2 (two) times daily with a meal.  . TRIAMTERENE-HCTZ PO Take 1 tablet by mouth daily.  Marland Kitchen buPROPion (WELLBUTRIN SR) 150 MG 12 hr tablet Take 150 mg by mouth daily.  . nitroGLYCERIN (NITROSTAT) 0.4 MG SL tablet Place 0.4 mg under the tongue every 5 (five) minutes as needed for chest pain. (Patient not taking: Reported on 02/23/2021)  . [DISCONTINUED] budesonide-formoterol (SYMBICORT) 160-4.5 MCG/ACT inhaler Inhale 2 puffs into the lungs as needed (cough). (Patient not taking: Reported on 02/23/2021)   No facility-administered encounter medications on file as of 02/23/2021.     Review of Systems  Review of Systems  No chest pain with exertion.  Occasional chest tightness and wheeze with significant exertion but not day-to-day activities.  No orthopnea or PND.  Comprehensive review of systems otherwise negative. Physical Exam  BP (!) 146/86  (BP Location: Right Arm, Cuff Size: Normal)   Pulse 91   Temp 98 F (36.7 C)   Ht 5\' 2"  (1.575 m)   Wt 242 lb (109.8 kg)   SpO2 100%   BMI 44.26 kg/m   Wt Readings from Last 5 Encounters:  02/23/21 242 lb (109.8 kg)  11/04/20 238 lb 9.6 oz (108.2 kg)  10/02/20 235 lb 14.4 oz (107 kg)  07/30/20 239 lb (108.4 kg)  04/29/20 (!) 256 lb (116.1 kg)    BMI Readings from Last 5 Encounters:  02/23/21 44.26 kg/m  11/04/20 43.64 kg/m  10/02/20 43.15 kg/m  07/30/20 43.71 kg/m  04/29/20 46.82 kg/m     Physical Exam General: Well-appearing, no acute distress Eyes: EOMI, no icterus Neck: Supple, no JVP appreciated Cardiovascular: Regular rate and rhythm, no murmur Pulmonary: Clear to auscultation bilaterally, normal work of breathing on room air Abdomen: Nondistended, bowel sounds present MSK: No synovitis, joint effusion Neuro: Normal gait, no weakness Psych: Normal mood, full affect   Assessment & Plan:   Dyspnea on exertion: Likely multifactorial and related to deconditioning, obesity, history of asthma, and likely sarcoidosis.  PFTs ordered for further evaluation at next visit.  Pulmonary sarcoidosis: Based on 2016 supraclavicular left lymph node biopsy with granulomas, persistent stable hilar and mediastinal lymphadenopathy as well as persistent bilateral nodular opacities.  While there is some level activity given nodules, no worsening over time to suggest significantly active disease.  Will treat dyspnea and sarcoidosis/cough with ICS/LABA via high-dose Symbicort.  PFTs for baseline function.  Many reasons to avoid prednisone in this patient including risk of weight gain, hyperglycemia, osteoporosis etc.  Cough: Suspect related to asthma versus sarcoidosis.  ICS/LABA as above.   Return in about 3 months (around 05/26/2021).   Lanier Clam, MD 02/23/2021

## 2021-02-23 NOTE — Patient Instructions (Signed)
Nice to meet you  I think the spots in your lungs are related to sarcoid given the biopsy of the lymph node in 2016.  The spots in the lungs have not changed over the course of several years.  To treat severe shortness of breath and coughing which could be related to sarcoid, use Symbicort 2 puffs twice a day.  Rinse out your mouth after every use with water.  We will get PFTs or breathing test at the time of your next visit in 3 months  Follow-up with Dr. Silas Flood in 3 months or sooner as needed

## 2021-03-09 DIAGNOSIS — Z6841 Body Mass Index (BMI) 40.0 and over, adult: Secondary | ICD-10-CM | POA: Diagnosis not present

## 2021-03-09 DIAGNOSIS — L02236 Carbuncle of umbilicus: Secondary | ICD-10-CM | POA: Diagnosis not present

## 2021-03-20 DIAGNOSIS — N183 Chronic kidney disease, stage 3 unspecified: Secondary | ICD-10-CM | POA: Diagnosis not present

## 2021-03-20 DIAGNOSIS — E1165 Type 2 diabetes mellitus with hyperglycemia: Secondary | ICD-10-CM | POA: Diagnosis not present

## 2021-03-20 DIAGNOSIS — E1122 Type 2 diabetes mellitus with diabetic chronic kidney disease: Secondary | ICD-10-CM | POA: Diagnosis not present

## 2021-03-20 DIAGNOSIS — I10 Essential (primary) hypertension: Secondary | ICD-10-CM | POA: Diagnosis not present

## 2021-04-26 DIAGNOSIS — E119 Type 2 diabetes mellitus without complications: Secondary | ICD-10-CM | POA: Diagnosis not present

## 2021-04-26 DIAGNOSIS — R0981 Nasal congestion: Secondary | ICD-10-CM | POA: Diagnosis not present

## 2021-04-26 DIAGNOSIS — Z20828 Contact with and (suspected) exposure to other viral communicable diseases: Secondary | ICD-10-CM | POA: Diagnosis not present

## 2021-04-26 DIAGNOSIS — U071 COVID-19: Secondary | ICD-10-CM | POA: Diagnosis not present

## 2021-04-26 DIAGNOSIS — J069 Acute upper respiratory infection, unspecified: Secondary | ICD-10-CM | POA: Diagnosis not present

## 2021-04-26 DIAGNOSIS — R059 Cough, unspecified: Secondary | ICD-10-CM | POA: Diagnosis not present

## 2021-05-11 ENCOUNTER — Ambulatory Visit: Payer: BC Managed Care – PPO | Admitting: Cardiology

## 2021-06-15 ENCOUNTER — Other Ambulatory Visit: Payer: Self-pay

## 2021-06-17 ENCOUNTER — Ambulatory Visit: Payer: BC Managed Care – PPO | Admitting: Cardiology

## 2021-06-23 DIAGNOSIS — E119 Type 2 diabetes mellitus without complications: Secondary | ICD-10-CM | POA: Diagnosis not present

## 2021-07-08 DIAGNOSIS — H4051X3 Glaucoma secondary to other eye disorders, right eye, severe stage: Secondary | ICD-10-CM | POA: Diagnosis not present

## 2021-07-17 DIAGNOSIS — H40003 Preglaucoma, unspecified, bilateral: Secondary | ICD-10-CM | POA: Diagnosis not present

## 2021-07-28 ENCOUNTER — Ambulatory Visit: Payer: BC Managed Care – PPO | Admitting: Pulmonary Disease

## 2021-07-29 ENCOUNTER — Ambulatory Visit: Payer: BC Managed Care – PPO | Admitting: Cardiology

## 2021-07-29 ENCOUNTER — Encounter: Payer: Self-pay | Admitting: Cardiology

## 2021-07-29 ENCOUNTER — Other Ambulatory Visit: Payer: Self-pay

## 2021-07-29 VITALS — BP 152/90 | HR 72 | Ht 62.0 in | Wt 239.2 lb

## 2021-07-29 DIAGNOSIS — I2510 Atherosclerotic heart disease of native coronary artery without angina pectoris: Secondary | ICD-10-CM | POA: Diagnosis not present

## 2021-07-29 DIAGNOSIS — Z6841 Body Mass Index (BMI) 40.0 and over, adult: Secondary | ICD-10-CM

## 2021-07-29 DIAGNOSIS — D869 Sarcoidosis, unspecified: Secondary | ICD-10-CM

## 2021-07-29 DIAGNOSIS — I10 Essential (primary) hypertension: Secondary | ICD-10-CM | POA: Diagnosis not present

## 2021-07-29 DIAGNOSIS — E088 Diabetes mellitus due to underlying condition with unspecified complications: Secondary | ICD-10-CM

## 2021-07-29 DIAGNOSIS — E782 Mixed hyperlipidemia: Secondary | ICD-10-CM

## 2021-07-29 NOTE — Patient Instructions (Addendum)

## 2021-07-29 NOTE — Addendum Note (Signed)
Addended by: Truddie Hidden on: 07/29/2021 09:54 AM   Modules accepted: Orders

## 2021-07-29 NOTE — Progress Notes (Signed)
Cardiology Office Note:    Date:  07/29/2021   ID:  Melissa Richards, DOB [birth year], MRN [id number]  PCP:  Serita Grammes, MD  Cardiologist:  Jenean Lindau, MD   Referring MD: Serita Grammes, MD    ASSESSMENT:    1. Atherosclerosis of native coronary artery of native heart without angina pectoris   2. Primary hypertension   3. Diabetes mellitus due to underlying condition with unspecified complications ([place])   4. Morbid obesity with body mass index (BMI) of 40.0 to 44.9 in adult ([place])   5. Sarcoidosis    PLAN:    In order of problems listed above:  Coronary artery disease: Secondary prevention stressed with the patient.  Importance of compliance with diet medication stressed and she vocalized understanding. Essential hypertension: Blood pressure stable and diet was emphasized.  Lifestyle modification urged.  She has an element of whitecoat hypertension and her blood pressures are better at home and she mentioned to me the numbers. Mixed dyslipidemia, diabetes mellitus and obesity: Weight reduction was stressed lifestyle modification urged and she promises to do better.  She is fasting and will have complete blood work today. Sarcoidosis: Managed by primary care.  She also sees specialist for this. Renal insufficiency: We will do a Chem-7 today and forward all blood work reports to primary care doctor. Patient will be seen in follow-up appointment in 6 months or earlier if the patient has any concerns    Medication Adjustments/Labs and Tests Ordered: Current medicines are reviewed at length with the patient today.  Concerns regarding medicines are outlined above.  No orders of the defined types were placed in this encounter.  No orders of the defined types were placed in this encounter.    No chief complaint on file.    History of Present Illness:    Melissa Richards is a 55 y.o. female.  Patient has past medical history of coronary artery disease, essential  hypertension, dyslipidemia, diabetes mellitus sarcoidosis and morbid obesity.  She has now been told to have chronic renal insufficiency.  She denies any problems at this time and takes care of activities of daily living.  No chest pain orthopnea or PND.  At the time of my evaluation, the patient is alert awake oriented and in no distress.  Past Medical History:  Diagnosis Date   Adnexal mass    BILATERAL   Angina pectoris ([place]) 04/29/2020   Anxiety    Arthritis    Asthma    Asthma, mild intermittent 08/12/2015   Atypical chest pain 08/10/2018   BMI 45.0-49.9, adult ([place]) 08/12/2015   Constipation    Coronary atherosclerosis 07/30/2020   Depression    Diabetes mellitus due to underlying condition with unspecified complications ([place]) 01/10/8118   Diabetes mellitus without complication ([place])    Elevated CA-125 07/28/2015   Family history of adverse reaction to anesthesia    FAMILY MEMBERS ARE SLOW TO WAKE UP   History of colonic polyps 03/17/2020   HTN (hypertension) 07/28/2015   Hypertension    Mass, ovarian    Mixed dyslipidemia 07/30/2020   Morbid obesity ([place]) 07/30/2020   Morbid obesity with body mass index (BMI) of 40.0 to 44.9 in adult ([place]) 07/28/2015   Numbness and tingling    FEET /LEGS   Obstructive sleep apnea 07/28/2015   ovarian ca dx'd 07/2015   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) 07/28/2015    Past Surgical History:  Procedure Laterality Date   ABDOMINAL HYSTERECTOMY Bilateral 09/04/2015   Procedure: HYSTERECTOMY TOTAL ABDOMINAL, EXPLORATORY LAPAROTOMY, BILATERAL SALPINGO OOPHERECTOMY, OMENTECTOMY, DEBULKING;  Surgeon: [name], MD;  Location: [facility];   Service: Gynecology;  Laterality: Bilateral;   CARDIAC CATHETERIZATION     2 years ago   TONSILLECTOMY     TUBAL LIGATION      Current Medications: Current Meds  Medication Sig   aspirin EC 81 MG tablet Take 81 mg by mouth daily. Swallow whole.   atenolol (TENORMIN) 50 MG tablet Take 1 tablet (50 mg total) by mouth every morning.   atorvastatin (LIPITOR) 10 MG tablet TAKE 1 TABLET BY MOUTH EVERY DAY   budesonide-formoterol (SYMBICORT) 160-4.5 MCG/ACT inhaler Inhale 2 puffs into the lungs in the morning and at bedtime.   busPIRone (BUSPAR) 15 MG tablet Take 15 mg by mouth 3 (three) times daily.   dorzolamide-timolol (COSOPT) 22.3-6.8 MG/ML ophthalmic solution Place 1 drop into the right eye 2 (two) times daily.   lisinopril (PRINIVIL,ZESTRIL) 20 MG tablet Take 20 mg by mouth daily.   LUMIGAN 0.01 % SOLN Place 1 drop into the right eye at bedtime.   metFORMIN (GLUCOPHAGE) 500 MG tablet Take 500 mg by mouth 2 (two) times daily with a meal.   nitroGLYCERIN (NITROSTAT) 0.4 MG SL tablet Place 0.4 mg under the tongue every 5 (five) minutes as needed for chest pain.   traZODone (DESYREL) 100 MG tablet Take 50 mg by mouth daily.   triamterene-hydrochlorothiazide (MAXZIDE-25) 37.5-25 MG tablet Take 1 tablet by mouth daily.     Allergies:   Hydrocodone   Social History   Socioeconomic History   Marital status: Married    Spouse name: Not on file   Number of children: Not on file   Years of education: Not on file   Highest education level: Not on file  Occupational History   Not on file  Tobacco Use   Smoking status: Never   Smokeless tobacco: Never  Vaping Use   Vaping Use: Never used  Substance and Sexual Activity   Alcohol use: No   Drug use: No   Sexual activity: Not on file  Other Topics Concern   Not on file  Social History Narrative   Not on file   Social Determinants of Health   Financial Resource Strain: Not on file  Food Insecurity: Not on file  Transportation  Needs: Not on file  Physical Activity: Not on file  Stress: Not on file  Social Connections: Not on file     Family History: The patient's family history includes Anesthesia problems in her mother; Cancer in her father; Diabetes in her brother and mother; Hypertension in her mother.  ROS:   Please see the history of present illness.    All other systems reviewed and are negative.  EKGs/Labs/Other Studies Reviewed:    The following studies were reviewed today: I discussed my findings with the patient at length   Recent Labs: 07/30/2020: ALT 28; Hemoglobin 14.8; Platelets 223; TSH 0.527 10/02/2020: BUN 12; Creatinine, Ser 1.08; Potassium 4.0; Sodium 137  Recent Lipid Panel    Component Value Date/Time   CHOL 130 07/30/2020 1143   TRIG 147 07/30/2020 1143  HDL 33 (L) 07/30/2020 1143   CHOLHDL 3.9 07/30/2020 1143   LDLCALC 71 07/30/2020 1143    Physical Exam:    VS:  BP (!) 152/90   Pulse 72   Ht 5\' 2"  (1.575 m)   Wt 239 lb 3.2 oz (108.5 kg)   SpO2 98%   BMI 43.75 kg/m     Wt Readings from Last 3 Encounters:  07/29/21 239 lb 3.2 oz (108.5 kg)  02/23/21 242 lb (109.8 kg)  11/04/20 238 lb 9.6 oz (108.2 kg)     GEN: Patient is in no acute distress HEENT: Normal NECK: No JVD; No carotid bruits LYMPHATICS: No lymphadenopathy CARDIAC: Hear sounds regular, 2/6 systolic murmur at the apex. RESPIRATORY:  Clear to auscultation without rales, wheezing or rhonchi  ABDOMEN: Soft, non-tender, non-distended MUSCULOSKELETAL:  No edema; No deformity  SKIN: Warm and dry NEUROLOGIC:  Alert and oriented x 3 PSYCHIATRIC:  Normal affect   Signed, Jenean Lindau, MD  07/29/2021 9:30 AM    [place]

## 2021-07-30 LAB — BASIC METABOLIC PANEL
BUN/Creatinine Ratio: 14 (ref 9–23)
BUN: 12 mg/dL (ref 6–24)
CO2: 25 mmol/L (ref 20–29)
Calcium: 9.7 mg/dL (ref 8.7–10.2)
Chloride: 99 mmol/L (ref 96–106)
Creatinine, Ser: 0.87 mg/dL (ref 0.57–1.00)
Glucose: 118 mg/dL — ABNORMAL HIGH (ref 70–99)
Potassium: 3.9 mmol/L (ref 3.5–5.2)
Sodium: 138 mmol/L (ref 134–144)
eGFR: 78 mL/min/{1.73_m2} (ref >59)

## 2021-07-30 LAB — CBC WITH DIFFERENTIAL/PLATELET
Basophils Absolute: 0.1 10*3/uL (ref 0.0–0.2)
Basos: 1 %
EOS (ABSOLUTE): 0.5 10*3/uL — ABNORMAL HIGH (ref 0.0–0.4)
Eos: 7 %
Hematocrit: 44.6 % (ref 34.0–46.6)
Hemoglobin: 15.1 g/dL (ref 11.1–15.9)
Immature Grans (Abs): 0 10*3/uL (ref 0.0–0.1)
Immature Granulocytes: 0 %
Lymphocytes Absolute: 1.1 10*3/uL (ref 0.7–3.1)
Lymphs: 15 %
MCH: 29.9 pg (ref 26.6–33.0)
MCHC: 33.9 g/dL (ref 31.5–35.7)
MCV: 88 fL (ref 79–97)
Monocytes Absolute: 0.8 10*3/uL (ref 0.1–0.9)
Monocytes: 11 %
Neutrophils Absolute: 4.7 10*3/uL (ref 1.4–7.0)
Neutrophils: 66 %
Platelets: 213 10*3/uL (ref 150–450)
RBC: 5.05 x10E6/uL (ref 3.77–5.28)
RDW: 11.9 % (ref 11.7–15.4)
WBC: 7.1 10*3/uL (ref 3.4–10.8)

## 2021-07-30 LAB — HEPATIC FUNCTION PANEL
ALT: 26 IU/L (ref 0–32)
AST: 18 IU/L (ref 0–40)
Albumin: 4.4 g/dL (ref 3.8–4.9)
Alkaline Phosphatase: 72 IU/L (ref 44–121)
Bilirubin Total: 0.4 mg/dL (ref 0.0–1.2)
Bilirubin, Direct: 0.11 mg/dL (ref 0.00–0.40)
Total Protein: 6.3 g/dL (ref 6.0–8.5)

## 2021-07-30 LAB — LIPID PANEL
Chol/HDL Ratio: 5.2 ratio — ABNORMAL HIGH (ref 0.0–4.4)
Cholesterol, Total: 193 mg/dL (ref 100–199)
HDL: 37 mg/dL — ABNORMAL LOW (ref >39)
LDL Chol Calc (NIH): 119 mg/dL — ABNORMAL HIGH (ref 0–99)
Triglycerides: 211 mg/dL — ABNORMAL HIGH (ref 0–149)
VLDL Cholesterol Cal: 37 mg/dL (ref 5–40)

## 2021-07-30 LAB — TSH: TSH: 1.07 u[IU]/mL (ref 0.450–4.500)

## 2021-07-30 NOTE — Addendum Note (Signed)
Addended by: Truddie Hidden on: 07/30/2021 11:33 AM   Modules accepted: Orders

## 2021-08-21 DIAGNOSIS — H40003 Preglaucoma, unspecified, bilateral: Secondary | ICD-10-CM | POA: Diagnosis not present

## 2021-09-07 ENCOUNTER — Encounter: Payer: Self-pay | Admitting: Gynecologic Oncology

## 2021-09-07 DIAGNOSIS — K439 Ventral hernia without obstruction or gangrene: Secondary | ICD-10-CM | POA: Diagnosis not present

## 2021-09-07 DIAGNOSIS — I700 Atherosclerosis of aorta: Secondary | ICD-10-CM | POA: Diagnosis not present

## 2021-09-07 DIAGNOSIS — D869 Sarcoidosis, unspecified: Secondary | ICD-10-CM | POA: Diagnosis not present

## 2021-09-07 DIAGNOSIS — K828 Other specified diseases of gallbladder: Secondary | ICD-10-CM | POA: Diagnosis not present

## 2021-09-07 DIAGNOSIS — R590 Localized enlarged lymph nodes: Secondary | ICD-10-CM | POA: Diagnosis not present

## 2021-09-07 DIAGNOSIS — K5730 Diverticulosis of large intestine without perforation or abscess without bleeding: Secondary | ICD-10-CM | POA: Diagnosis not present

## 2021-09-07 DIAGNOSIS — K8020 Calculus of gallbladder without cholecystitis without obstruction: Secondary | ICD-10-CM | POA: Diagnosis not present

## 2021-09-07 DIAGNOSIS — C569 Malignant neoplasm of unspecified ovary: Secondary | ICD-10-CM | POA: Diagnosis not present

## 2021-09-07 DIAGNOSIS — R918 Other nonspecific abnormal finding of lung field: Secondary | ICD-10-CM | POA: Diagnosis not present

## 2021-09-11 DIAGNOSIS — Z1231 Encounter for screening mammogram for malignant neoplasm of breast: Secondary | ICD-10-CM | POA: Diagnosis not present

## 2021-09-14 DIAGNOSIS — C569 Malignant neoplasm of unspecified ovary: Secondary | ICD-10-CM | POA: Diagnosis not present

## 2021-09-21 DIAGNOSIS — N183 Chronic kidney disease, stage 3 unspecified: Secondary | ICD-10-CM | POA: Diagnosis not present

## 2021-09-21 DIAGNOSIS — I700 Atherosclerosis of aorta: Secondary | ICD-10-CM | POA: Diagnosis not present

## 2021-09-21 DIAGNOSIS — E785 Hyperlipidemia, unspecified: Secondary | ICD-10-CM | POA: Diagnosis not present

## 2021-09-21 DIAGNOSIS — E1122 Type 2 diabetes mellitus with diabetic chronic kidney disease: Secondary | ICD-10-CM | POA: Diagnosis not present

## 2021-09-21 DIAGNOSIS — Z23 Encounter for immunization: Secondary | ICD-10-CM | POA: Diagnosis not present

## 2021-09-21 DIAGNOSIS — E1165 Type 2 diabetes mellitus with hyperglycemia: Secondary | ICD-10-CM | POA: Diagnosis not present

## 2021-10-23 DIAGNOSIS — Z20828 Contact with and (suspected) exposure to other viral communicable diseases: Secondary | ICD-10-CM | POA: Diagnosis not present

## 2021-10-23 DIAGNOSIS — J329 Chronic sinusitis, unspecified: Secondary | ICD-10-CM | POA: Diagnosis not present

## 2021-10-23 DIAGNOSIS — E1165 Type 2 diabetes mellitus with hyperglycemia: Secondary | ICD-10-CM | POA: Diagnosis not present

## 2021-10-23 DIAGNOSIS — J40 Bronchitis, not specified as acute or chronic: Secondary | ICD-10-CM | POA: Diagnosis not present

## 2021-12-21 DIAGNOSIS — R5381 Other malaise: Secondary | ICD-10-CM | POA: Diagnosis not present

## 2021-12-21 DIAGNOSIS — Z20828 Contact with and (suspected) exposure to other viral communicable diseases: Secondary | ICD-10-CM | POA: Diagnosis not present

## 2021-12-21 DIAGNOSIS — R112 Nausea with vomiting, unspecified: Secondary | ICD-10-CM | POA: Diagnosis not present

## 2021-12-21 DIAGNOSIS — R5383 Other fatigue: Secondary | ICD-10-CM | POA: Diagnosis not present

## 2021-12-29 ENCOUNTER — Other Ambulatory Visit: Payer: Self-pay

## 2021-12-29 MED ORDER — NITROGLYCERIN 0.4 MG SL SUBL: 0.4000 mg | SUBLINGUAL_TABLET | SUBLINGUAL | 6 refills | Status: AC | PRN

## 2021-12-29 NOTE — Telephone Encounter (Signed)
Refill of Nitroglycerin 0.4 mg sent to [facility].  ?

## 2022-01-26 ENCOUNTER — Other Ambulatory Visit: Payer: Self-pay

## 2022-01-27 ENCOUNTER — Ambulatory Visit: Payer: BC Managed Care – PPO | Admitting: Cardiology

## 2022-01-27 ENCOUNTER — Encounter: Payer: Self-pay | Admitting: Cardiology

## 2022-01-27 VITALS — BP 170/90 | HR 78 | Ht 62.0 in | Wt 248.0 lb

## 2022-01-27 DIAGNOSIS — E088 Diabetes mellitus due to underlying condition with unspecified complications: Secondary | ICD-10-CM | POA: Diagnosis not present

## 2022-01-27 DIAGNOSIS — I10 Essential (primary) hypertension: Secondary | ICD-10-CM | POA: Diagnosis not present

## 2022-01-27 DIAGNOSIS — I2510 Atherosclerotic heart disease of native coronary artery without angina pectoris: Secondary | ICD-10-CM | POA: Diagnosis not present

## 2022-01-27 DIAGNOSIS — Z6841 Body Mass Index (BMI) 40.0 and over, adult: Secondary | ICD-10-CM

## 2022-01-27 DIAGNOSIS — D869 Sarcoidosis, unspecified: Secondary | ICD-10-CM

## 2022-01-27 NOTE — Progress Notes (Signed)
?Cardiology Office Note:   ? ?Date:  01/27/2022  ? ?ID:  Melissa Richards, DOB 1966-09-10, MRN [id number] ? ?PCP:  Melissa Grammes, MD  ?Cardiologist:  Melissa Lindau, MD  ? ?Referring MD: Melissa Grammes, MD  ? ? ?ASSESSMENT:   ? ?1. Atherosclerosis of native coronary artery of native heart without angina pectoris   ?2. Essential (primary) hypertension   ?3. Morbid obesity with body mass index (BMI) of 40.0 to 44.9 in adult [facility])   ?4. Diabetes mellitus due to underlying condition with unspecified complications ([place])   ?5. Sarcoidosis   ? ?PLAN:   ? ?In order of problems listed above: ? ?Coronary atherosclerosis: Secondary prevention stressed with the patient.  Importance of compliance with diet medication stressed any vocalized understanding.  She was advised to walk at least half an hour a day 5 days a week. ?Essential hypertension: Elevated blood pressure.  Patient mentions to me that she is surprised about elevated blood pressure.  She will keep a track of blood pressures at home and send [name] the numbers and will adjust medications if needed.  She does not want to increase medications now because she feels that generally her blood pressure at home is under better control.  I respect her wishes. ?Mixed dyslipidemia: Lipids reviewed.  They were done by primary care and those records were obtained by [name] and lipids are fine and diet was emphasized. ?Morbid obesity: Risks of obesity explained and she plans to do better.  Lifestyle modification urged. ?Sarcoidosis: Managed by pulmonary colleagues. ?Patient will be seen in follow-up appointment in 9 months or earlier if the patient has any concerns ? ? ? ?Medication Adjustments/Labs and Tests Ordered: ?Current medicines are reviewed at length with the patient today.  Concerns regarding medicines are outlined above.  ?No orders of the defined types were placed in this encounter. ? ?No orders of the defined types were placed in this encounter. ? ? ? ?Chief Complaint   ?Patient presents with  ? Follow-up  ?  ? ?History of Present Illness:   ? ?Melissa Richards is a 56 y.o. female.  Patient has past medical history of coronary atherosclerosis, essential hypertension, mixed dyslipidemia and diabetes mellitus and obesity.  She denies any problems at this time and takes care of activities of daily living.  No chest pain orthopnea or PND.  She leads a sedentary lifestyle. ? ?Past Medical History:  ?Diagnosis Date  ? Adnexal mass   ? BILATERAL  ? Angina pectoris ([place]) 04/29/2020  ? Anxiety   ? Arthritis   ? Asthma   ? Asthma, mild intermittent 08/12/2015  ? Atypical chest pain 08/10/2018  ? BMI 45.0-49.9, adult ([name]) 08/12/2015  ? Constipation   ? Coronary atherosclerosis 07/30/2020  ? Depression   ? Diabetes mellitus due to underlying condition with unspecified complications ([name]) 4/76/5465  ? Diabetes mellitus without complication ([name])   ? Elevated CA-125 07/28/2015  ? Essential (primary) hypertension 07/28/2015  ? Family history of adverse reaction to anesthesia   ? FAMILY MEMBERS ARE SLOW TO WAKE UP  ? History of colonic polyps 03/17/2020  ? HTN (hypertension) 07/28/2015  ? Hypertension   ? Mass, ovarian   ? Mixed dyslipidemia 07/30/2020  ? Morbid obesity ([name]) 07/30/2020  ? Morbid obesity with body mass index (BMI) of 40.0 to 44.9 in adult [facility]) 07/28/2015  ? Numbness and tingling   ? FEET /LEGS  ? Obstructive sleep apnea 07/28/2015  ? ovarian ca dx'd 07/2015  ? 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 Ovarian mass, left 07/28/2015  ? Pelvic mass in female 09/04/2015  ? Recurrent UTI   ? Sarcoidosis 08/10/2018  ? Shortness of breath 07/28/2015  ? Sleep apnea   ? DOES NOT USE C -PAP  ? Special screening examination for viral disease 03/17/2020  ? Supraclavicular adenopathy   ? Type II diabetes mellitus ([place]) 07/28/2015  ? ? ?Past Surgical History:  ?Procedure Laterality Date  ? ABDOMINAL HYSTERECTOMY Bilateral 09/04/2015  ? Procedure: HYSTERECTOMY TOTAL ABDOMINAL, EXPLORATORY  LAPAROTOMY, BILATERAL SALPINGO OOPHERECTOMY, OMENTECTOMY, DEBULKING;  Surgeon: [name], MD;  Location: [facility];  Service: Gynecology;  Laterality: Bilateral;  ? CARDIAC CATHETERIZATION    ? 2 years ago  ? TONSILLECTOMY    ? TUBAL LIGATION    ? ? ?Current Medications: ?Current Meds  ?Medication Sig  ? aspirin EC 81 MG tablet Take 81 mg by mouth daily. Swallow whole.  ? atenolol (TENORMIN) 50 MG tablet Take 1 tablet (50 mg total) by mouth every morning.  ? atorvastatin (LIPITOR) 10 MG tablet TAKE 1 TABLET BY MOUTH EVERY DAY  ? budesonide-formoterol (SYMBICORT) 160-4.5 MCG/ACT inhaler Inhale 2 puffs into the lungs in the morning and at bedtime.  ? busPIRone (BUSPAR) 15 MG tablet Take 15 mg by mouth 3 (three) times daily.  ? dorzolamide-timolol (COSOPT) 22.3-6.8 MG/ML ophthalmic solution Place 1 drop into the right eye 2 (two) times daily.  ? lisinopril (PRINIVIL,ZESTRIL) 20 MG tablet Take 20 mg by mouth daily.  ? LUMIGAN 0.01 % SOLN Place 1 drop into the right eye at bedtime.  ? metFORMIN (GLUCOPHAGE) 500 MG tablet Take 500 mg by mouth 2 (two) times daily with a meal.  ? nitroGLYCERIN (NITROSTAT) 0.4 MG SL tablet Place 1 tablet (0.4 mg total) under the tongue every 5 (five) minutes as needed for chest pain.  ? traZODone (DESYREL) 100 MG tablet Take 50 mg by mouth daily.  ? triamterene-hydrochlorothiazide (MAXZIDE-25) 37.5-25 MG tablet Take 1 tablet by mouth daily.  ?  ? ?Allergies:   Hydrocodone  ? ?Social History  ? ?Socioeconomic History  ? Marital status: Married  ?  Spouse name: Not on file  ? Number of children: Not on file  ? Years of education: Not on file  ? Highest education level: Not on file  ?Occupational History  ? Not on file  ?Tobacco Use  ? Smoking status: Never  ? Smokeless tobacco: Never  ?Vaping Use  ? Vaping Use: Never used  ?Substance and Sexual Activity  ? Alcohol use: No  ? Drug use: No  ? Sexual activity: Not on file  ?Other Topics Concern  ? Not on file  ?Social History Narrative  ? Not on  file  ? ?Social Determinants of Health  ? ?Financial Resource Strain: Not on file  ?Food Insecurity: Not on file  ?Transportation Needs: Not on file  ?Physical Activity: Not on file  ?Stress: Not on file  ?Social Connections: Not on file  ?  ? ?Family History: ?The patient's family history includes Anesthesia problems in her mother; Cancer in her father; Diabetes in her brother and mother; Hypertension in her mother. ? ?ROS:   ?Please see the history of present illness.    ?All other systems reviewed and are negative. ? ?EKGs/Labs/Other Studies Reviewed:   ? ?The following studies were reviewed today: ?EKG reveals sinus rhythm and nonspecific ST-T changes ? ? ?Recent Labs: ?07/29/2021: ALT 26; BUN 12; Creatinine, Ser 0.87; Hemoglobin 15.1; Platelets 213; Potassium 3.9; Sodium 138;  TSH 1.070  ?Recent Lipid Panel ?   ?Component Value Date/Time  ? CHOL 193 07/29/2021 0951  ? TRIG 211 (H) 07/29/2021 0951  ? HDL 37 (L) 07/29/2021 0951  ? CHOLHDL 5.2 (H) 07/29/2021 3094  ? [place] 119 (H) 07/29/2021 0768  ? ? ?Physical Exam:   ? ?VS:  BP (!) 170/90 (BP Location: Left Arm, Patient Position: Sitting, Cuff Size: Normal)   Pulse 78   Ht '5\' 2"'$  (1.575 m)   Wt 248 lb (112.5 kg)   SpO2 99%   BMI 45.36 kg/m?    ? ?Wt Readings from Last 3 Encounters:  ?01/27/22 248 lb (112.5 kg)  ?07/29/21 239 lb 3.2 oz (108.5 kg)  ?02/23/21 242 lb (109.8 kg)  ?  ? ?GEN: Patient is in no acute distress ?HEENT: Normal ?NECK: No JVD; No carotid bruits ?LYMPHATICS: No lymphadenopathy ?CARDIAC: Hear sounds regular, 2/6 systolic murmur at the apex. ?RESPIRATORY:  Clear to auscultation without rales, wheezing or rhonchi  ?ABDOMEN: Soft, non-tender, non-distended ?MUSCULOSKELETAL:  No edema; No deformity  ?SKIN: Warm and dry ?NEUROLOGIC:  Alert and oriented x 3 ?PSYCHIATRIC:  Normal affect  ? ?Signed, ?Melissa Lindau, MD  ?01/27/2022 9:34 AM    ?[place]  ?

## 2022-01-27 NOTE — Patient Instructions (Signed)
Please keep a BP log for 2 weeks and send by MyChart or mail. ? ?Blood Pressure Record Sheet ?To take your blood pressure, you will need a blood pressure machine. You can buy a blood pressure machine (blood pressure monitor) at your clinic, drug store, or online. When choosing one, consider: ?An automatic monitor that has an arm cuff. ?A cuff that wraps snugly around your upper arm. You should be able to fit only one finger between your arm and the cuff. ?A device that stores blood pressure reading results. ?Do not choose a monitor that measures your blood pressure from your wrist or finger. ?Follow your health care provider's instructions for how to take your blood pressure. To use this form: ?Get one reading in the morning (a.m.) 1-2 hours after you take any medicines. ?Get one reading in the evening (p.m.) before supper. ?Write down the results in the spaces on this form. ?Repeat this once a week, or as told by your health care provider. ? ?Make a follow-up appointment with your health care provider to discuss the results. ?Blood pressure log ?Date: _______________________ ?a.m. _____________________(1st reading) HR___________ ? ?          p.m. _____________________(2nd reading) HR__________ ? ?Date: _______________________ ?a.m. _____________________(1st reading) HR___________ ? ?          p.m. _____________________(2nd reading) HR__________ ?Date: _______________________ ?a.m. _____________________(1st reading) HR___________ ? ?          p.m. _____________________(2nd reading) HR__________ ?Date: _______________________ ?a.m. _____________________(1st reading) HR___________ ? ?          p.m. _____________________(2nd reading) HR__________ ? ?Date: _______________________ ?a.m. _____________________(1st reading) HR___________ ? ?          p.m. _____________________(2nd reading) HR__________ ? ?Date: _______________________ ?a.m. _____________________(1st reading) HR___________ ? ?          p.m.  _____________________(2nd reading) HR__________ ? ?Date: _______________________ ?a.m. _____________________(1st reading) HR___________ ? ?          p.m. _____________________(2nd reading) HR__________ ? ? ?This information is not intended to replace advice given to you by your health care provider. Make sure you discuss any questions you have with your health care provider. ?Document Revised: 01/09/2020 Document Reviewed: 01/09/2020 ?Elsevier Patient Education ? [place].  ? ?Medication Instructions:  ?Your physician recommends that you continue on your current medications as directed. Please refer to the Current Medication list given to you today.  ?*If you need a refill on your cardiac medications before your next appointment, please call your pharmacy* ? ? ?Lab Work: ?None ordered ?If you have labs (blood work) drawn today and your tests are completely normal, you will receive your results only by: ?MyChart Message (if you have MyChart) OR ?A paper copy in the mail ?If you have any lab test that is abnormal or we need to change your treatment, we will call you to review the results. ? ? ?Testing/Procedures: ?None ordered ? ? ?Follow-Up: ?At [facility], you and your health needs are our priority.  As part of our continuing mission to provide you with exceptional heart care, we have created designated Provider Care Teams.  These Care Teams include your primary Cardiologist (physician) and Advanced Practice Providers (APPs -  Physician Assistants and Nurse Practitioners) who all work together to provide you with the care you need, when you need it. ? ?We recommend signing up for the patient portal called "MyChart".  Sign up information is provided on this After Visit Summary.  MyChart is used to connect with patients  for Virtual Visits (Telemedicine).  Patients are able to view lab/test results, encounter notes, upcoming appointments, etc.  Non-urgent messages can be sent to your provider as well.    ?To learn more about what you can do with MyChart, go to [email or web address].   ? ?Your next appointment:   ?9 month(s) ? ?The format for your next appointment:   ?In Person ? ?Provider:   ?Jyl Heinz, MD ? ? ?Other Instructions ?NA ? ?

## 2022-02-03 DIAGNOSIS — H4041X3 Glaucoma secondary to eye inflammation, right eye, severe stage: Secondary | ICD-10-CM | POA: Diagnosis not present

## 2022-02-25 DIAGNOSIS — Z0000 Encounter for general adult medical examination without abnormal findings: Secondary | ICD-10-CM | POA: Diagnosis not present

## 2022-02-25 DIAGNOSIS — H401212 Low-tension glaucoma, right eye, moderate stage: Secondary | ICD-10-CM | POA: Diagnosis not present

## 2022-02-25 DIAGNOSIS — Z131 Encounter for screening for diabetes mellitus: Secondary | ICD-10-CM | POA: Diagnosis not present

## 2022-02-25 DIAGNOSIS — M549 Dorsalgia, unspecified: Secondary | ICD-10-CM | POA: Diagnosis not present

## 2022-02-25 DIAGNOSIS — Z13220 Encounter for screening for lipoid disorders: Secondary | ICD-10-CM | POA: Diagnosis not present

## 2022-02-25 DIAGNOSIS — G8929 Other chronic pain: Secondary | ICD-10-CM | POA: Diagnosis not present

## 2022-03-14 ENCOUNTER — Other Ambulatory Visit: Payer: Self-pay | Admitting: Pulmonary Disease

## 2022-03-17 ENCOUNTER — Other Ambulatory Visit: Payer: Self-pay

## 2022-03-17 DIAGNOSIS — D869 Sarcoidosis, unspecified: Secondary | ICD-10-CM

## 2022-03-18 ENCOUNTER — Ambulatory Visit
Admission: RE | Admit: 2022-03-18 | Discharge: 2022-03-18 | Disposition: A | Discharge status: Home / Self Care | Payer: Self-pay | Source: Ambulatory Visit | Attending: Pulmonary Disease | Admitting: Pulmonary Disease

## 2022-03-18 ENCOUNTER — Encounter: Payer: Self-pay | Admitting: Pulmonary Disease

## 2022-03-18 ENCOUNTER — Ambulatory Visit (INDEPENDENT_AMBULATORY_CARE_PROVIDER_SITE_OTHER): Payer: BC Managed Care – PPO | Admitting: Pulmonary Disease

## 2022-03-18 ENCOUNTER — Ambulatory Visit: Payer: BC Managed Care – PPO | Admitting: Pulmonary Disease

## 2022-03-18 VITALS — BP 128/66 | HR 80 | Temp 98.2°F | Ht 62.0 in | Wt 249.0 lb

## 2022-03-18 DIAGNOSIS — J4520 Mild intermittent asthma, uncomplicated: Secondary | ICD-10-CM

## 2022-03-18 DIAGNOSIS — D869 Sarcoidosis, unspecified: Secondary | ICD-10-CM

## 2022-03-18 LAB — PULMONARY FUNCTION TEST
DL/VA % pred: 128 %
DL/VA: 5.51 ml/min/mmHg/L
DLCO cor % pred: 103 %
DLCO cor: 19.79 ml/min/mmHg
DLCO unc % pred: 103 %
DLCO unc: 19.79 ml/min/mmHg
FEF 25-75 Post: 2.68 L/sec
FEF 25-75 Pre: 2.38 L/sec
FEF2575-%Change-Post: 12 %
FEF2575-%Pred-Post: 115 %
FEF2575-%Pred-Pre: 102 %
FEV1-%Change-Post: 3 %
FEV1-%Pred-Post: 84 %
FEV1-%Pred-Pre: 81 %
FEV1-Post: 2.04 L
FEV1-Pre: 1.97 L
FEV1FVC-%Change-Post: 0 %
FEV1FVC-%Pred-Pre: 108 %
FEV6-%Change-Post: 2 %
FEV6-%Pred-Post: 79 %
FEV6-%Pred-Pre: 77 %
FEV6-Post: 2.39 L
FEV6-Pre: 2.32 L
FEV6FVC-%Change-Post: 0 %
FEV6FVC-%Pred-Post: 103 %
FEV6FVC-%Pred-Pre: 103 %
FVC-%Change-Post: 3 %
FVC-%Pred-Post: 76 %
FVC-%Pred-Pre: 74 %
FVC-Post: 2.4 L
FVC-Pre: 2.32 L
Post FEV1/FVC ratio: 85 %
Post FEV6/FVC ratio: 100 %
Pre FEV1/FVC ratio: 85 %
Pre FEV6/FVC Ratio: 100 %
RV % pred: 95 %
RV: 1.76 L
TLC % pred: 86 %
TLC: 4.13 L

## 2022-03-18 LAB — CT OUTSIDE FILMS CHEST
A1c: 6.6
Albumin, Urine POC: 4
Creatinine, POC: 0.9 mg/dL
EGFR (Non-African Amer.): 60

## 2022-03-18 MED ORDER — BUDESONIDE-FORMOTEROL FUMARATE 160-4.5 MCG/ACT IN AERO: 2.0000 | INHALATION_SPRAY | Freq: Two times a day (BID) | RESPIRATORY_TRACT | 12 refills | Status: AC

## 2022-03-18 NOTE — Patient Instructions (Signed)
CT looks unchanged - this is great news  Lung function tests today were normal - this is great news  I refilled Symbicort  Return to clinic in 6 months or sooner as needed

## 2022-03-18 NOTE — Progress Notes (Signed)
PFT done today. 

## 2022-03-18 NOTE — Progress Notes (Signed)
$'@Patient'G$  ID: Melissa Richards, female    DOB: 01/26/1966, 56 y.o.   MRN: [id number]  Chief Complaint  Patient presents with   Follow-up    Pt is here for follow up for her sarcoidosis. No issues noted with patient. She is currently on Symbicort daily.     Referring provider: Serita Grammes, MD  HPI:   56 y.o. woman whom we are seeing in follow-up for evaluation of sarcoidosis and cough.  Most recent surgical oncology note reviewed.  Last seen over 1 year ago.  Doing fine.  Placed on Symbicort.  Seems like cough is improved.  She not have much of an issue with that now.  That was her main complaint at last visit.  She was seen by surgical oncology/gynecology oncology at [facility] 09/2021.  CT chest abdomen pelvis was obtained at that time.  Report comments on stable scattered nodular opacities likely effect of sarcoidosis.  This seems what prompted patient plan to follow-up given report of nodules.  Again the scan was obtained 6 months ago.  Scan was present today via disc.  I personally reviewed images and compared to most recent CTA coronary in 2021 that demonstrates similar distribution and size of chronic pulmonary nodules likely reflective of sarcoidosis on my interpretation.  PFT performed today reviewed with patient in detail.  Interpreted as normal spirometry, no bronchodilator spots, normal lung volumes, DLCO within normal limits.  HPI at initial visit Patient notes a several year history of dyspnea on exertion.  May be worsening over the last several months to year.  Worse with inclines or stairs but present on flat surfaces as well.  Has good days and bad days.  Not initially worse with change in season of pollens.  No timing during the day with things are better or worse.  No positional changes that make things better or worse.  Does not report any change in environment in terms of new house, remodeling, flooding etc. that may contribute to symptoms.  She does use albuterol and this seems  to help intermittently with her shortness of breath.  She has been prescribed Symbicort but does not pick it up or using it.  Was using it at 1 time as needed and found to be somewhat helpful.  Reviewed serial CT scans most recent coronary CT 05/2020 on my review demonstrates stable nodular densities in bilateral lungs compared to CT chest in 2016 that I reviewed interpreted as the same.  She had a left supraclavicular lymph node biopsy 08/05/2015 that revealed focal cluster of given granulomas with lymphoid tissue present no malignant cells.  Cytology results reviewed.  TTE 09/2018 reviewed with mild mitral valve regurgitation, normal LA size, normal EF, unable to comment on diastolic function.  She reports routine eye checks with her eye care provider.  PMH: Hypertension, asthma, diabetes, seasonal allergies, OSA, ovarian cancer status post surgical resection 09/04/2015 Surgical history: Hysterectomy Family history: She reports first-degree relatives with emphysema and asthma as well as coronary artery disease Social history: Never smoker, lives with husband, lives in [place] / Pulmonary Flowsheets:   ACT:      No data to display          MMRC:     No data to display          [place]:      No data to display          Tests:   FENO:  No results found for: "NITRICOXIDE"  PFT:  Latest Ref Rng & Units 03/18/2022   10:33 AM  PFT Results  FVC-Pre L 2.32  P  FVC-Predicted Pre % 74  P  FVC-Post L 2.40  P  FVC-Predicted Post % 76  P  Pre FEV1/FVC % % 85  P  Post FEV1/FCV % % 85  P  FEV1-Pre L 1.97  P  FEV1-Predicted Pre % 81  P  FEV1-Post L 2.04  P  DLCO uncorrected ml/min/mmHg 19.79  P  DLCO UNC% % 103  P  DLCO corrected ml/min/mmHg 19.79  P  DLCO COR %Predicted % 103  P  DLVA Predicted % 128  P  TLC L 4.13  P  TLC % Predicted % 86  P  RV % Predicted % 95  P    P Preliminary result  Personally reviewed and Interpreted as normal  spirometry, no bronchodilator spots, normal lung volumes, DLCO within normal limits.  WALK:      No data to display          Imaging: Personally reviewed and as per EMR discussion this note  Lab Results: Personally reviewed, no anemia, eosinophils elevated to 500 07/2020 on most recent check CBC    Component Value Date/Time   WBC 7.1 07/29/2021 0951   WBC 8.7 05/18/2017 1417   WBC 12.2 (H) 09/06/2015 1050   RBC 5.05 07/29/2021 0951   RBC 4.85 05/18/2017 1417   RBC 3.27 (L) 09/06/2015 1050   HGB 15.1 07/29/2021 0951   HGB 14.2 05/18/2017 1417   HCT 44.6 07/29/2021 0951   HCT 42.4 05/18/2017 1417   PLT 213 07/29/2021 0951   MCV 88 07/29/2021 0951   MCV 87.4 05/18/2017 1417   MCH 29.9 07/29/2021 0951   MCH 29.2 05/18/2017 1417   MCH 25.4 (L) 09/06/2015 1050   MCHC 33.9 07/29/2021 0951   MCHC 33.4 05/18/2017 1417   MCHC 30.9 09/06/2015 1050   RDW 11.9 07/29/2021 0951   RDW 13.0 05/18/2017 1417   LYMPHSABS 1.1 07/29/2021 0951   LYMPHSABS 1.3 05/18/2017 1417   MONOABS 0.8 05/18/2017 1417   EOSABS 0.5 (H) 07/29/2021 0951   BASOSABS 0.1 07/29/2021 0951   BASOSABS 0.1 05/18/2017 1417    BMET    Component Value Date/Time   NA 138 07/29/2021 0951   NA 138 05/18/2017 1417   K 3.9 07/29/2021 0951   K 3.9 05/18/2017 1417   CL 99 07/29/2021 0951   CO2 25 07/29/2021 0951   CO2 26 05/18/2017 1417   GLUCOSE 118 (H) 07/29/2021 0951   GLUCOSE 212 (H) 10/02/2020 1331   GLUCOSE 205 (H) 05/18/2017 1417   BUN 12 07/29/2021 0951   BUN 11.4 05/18/2017 1417   CREATININE 0.87 07/29/2021 0951   CREATININE 0.96 10/13/2018 1023   CREATININE 0.9 05/18/2017 1417   CALCIUM 9.7 07/29/2021 0951   CALCIUM 9.7 05/18/2017 1417   GFRNONAA >60 10/02/2020 1331   GFRNONAA >60 10/13/2018 1023   GFRAA 72 07/30/2020 1143   GFRAA >60 10/13/2018 1023    BNP No results found for: "BNP"  ProBNP No results found for: "PROBNP"  Specialty Problems       Pulmonary Problems   Asthma    Obstructive sleep apnea   Shortness of breath   Asthma, mild intermittent   Sleep apnea    DOES NOT USE C -PAP       Allergies  Allergen Reactions   Hydrocodone Itching    Can only take w benadryl    Immunization History  Administered Date(s) Administered   Influenza,inj,Quad PF,6+ Mos 08/12/2015    Past Medical History:  Diagnosis Date   Adnexal mass    BILATERAL   Angina pectoris ([employer]) 04/29/2020   Anxiety    Arthritis    Asthma    Asthma, mild intermittent 08/12/2015   Atypical chest pain 08/10/2018   BMI 45.0-49.9, adult ([place]) 08/12/2015   Constipation    Coronary atherosclerosis 07/30/2020   Depression    Diabetes mellitus due to underlying condition with unspecified complications ([place]) 6/71/2458   Diabetes mellitus without complication ([place])    Elevated CA-125 07/28/2015   Essential (primary) hypertension 07/28/2015   Family history of adverse reaction to anesthesia    FAMILY MEMBERS ARE SLOW TO WAKE UP   History of colonic polyps 03/17/2020   HTN (hypertension) 07/28/2015   Hypertension    Mass, ovarian    Mixed dyslipidemia 07/30/2020   Morbid obesity ([place]) 07/30/2020   Morbid obesity with body mass index (BMI) of 40.0 to 44.9 in adult ([place]) 07/28/2015   Numbness and tingling    FEET /LEGS   Obstructive sleep apnea 07/28/2015   ovarian ca dx'd 07/2015   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   Ovarian mass, left 07/28/2015   Pelvic mass in female 09/04/2015   Recurrent UTI    Sarcoidosis 08/10/2018   Shortness of breath 07/28/2015   Sleep apnea    DOES NOT USE C -PAP   Special screening examination for viral disease 03/17/2020   Supraclavicular adenopathy    Type II diabetes mellitus ([place]) 07/28/2015    Tobacco History: Social History   Tobacco Use  Smoking Status Never  Smokeless Tobacco Never   Counseling given: Not Answered   Continue to not smoke  Outpatient Encounter Medications as of 03/18/2022  Medication Sig   aspirin EC 81  MG tablet Take 81 mg by mouth daily. Swallow whole.   atenolol (TENORMIN) 50 MG tablet Take 1 tablet (50 mg total) by mouth every morning.   atorvastatin (LIPITOR) 10 MG tablet TAKE 1 TABLET BY MOUTH EVERY DAY   busPIRone (BUSPAR) 15 MG tablet Take 15 mg by mouth 3 (three) times daily.   dorzolamide-timolol (COSOPT) 22.3-6.8 MG/ML ophthalmic solution Place 1 drop into the right eye 2 (two) times daily.   latanoprost (XALATAN) 0.005 % ophthalmic solution SMARTSIG:1 Drop(s) Right Eye Every Evening   lisinopril (PRINIVIL,ZESTRIL) 20 MG tablet Take 20 mg by mouth daily.   LUMIGAN 0.01 % SOLN Place 1 drop into the right eye at bedtime.   metFORMIN (GLUCOPHAGE) 500 MG tablet Take 500 mg by mouth 2 (two) times daily with a meal.   nitroGLYCERIN (NITROSTAT) 0.4 MG SL tablet Place 1 tablet (0.4 mg total) under the tongue every 5 (five) minutes as needed for chest pain.   traZODone (DESYREL) 100 MG tablet Take 50 mg by mouth daily.   triamterene-hydrochlorothiazide (MAXZIDE-25) 37.5-25 MG tablet Take 1 tablet by mouth daily.   [DISCONTINUED] budesonide-formoterol (SYMBICORT) 160-4.5 MCG/ACT inhaler Inhale 2 puffs into the lungs in the morning and at bedtime.   budesonide-formoterol (SYMBICORT) 160-4.5 MCG/ACT inhaler Inhale 2 puffs into the lungs in the morning and at bedtime.   No facility-administered encounter medications on file as of 03/18/2022.     Review of Systems  Review of Systems  N/a Physical Exam  BP 128/66 (BP Location: Right Arm, Patient Position: Sitting, Cuff Size: Normal)   Pulse 80   Temp 98.2 F (36.8 C) (Oral)   Ht '5\' 2"'$  (1.575  m)   Wt 249 lb (112.9 kg)   SpO2 94%   BMI 45.54 kg/m   Wt Readings from Last 5 Encounters:  03/18/22 249 lb (112.9 kg)  01/27/22 248 lb (112.5 kg)  07/29/21 239 lb 3.2 oz (108.5 kg)  02/23/21 242 lb (109.8 kg)  11/04/20 238 lb 9.6 oz (108.2 kg)    BMI Readings from Last 5 Encounters:  03/18/22 45.54 kg/m  01/27/22 45.36 kg/m  07/29/21  43.75 kg/m  02/23/21 44.26 kg/m  11/04/20 43.64 kg/m     Physical Exam General: Well-appearing, no acute distress Eyes: EOMI, no icterus Neck: Supple, no JVP appreciated Cardiovascular: Regular rate and rhythm, no murmur Pulmonary: Clear to auscultation bilaterally, normal work of breathing on room air Abdomen: Nondistended, bowel sounds present MSK: No synovitis, joint effusion Neuro: Normal gait, no weakness Psych: Normal mood, full affect   Assessment & Plan:   Dyspnea on exertion: Likely multifactorial and related to deconditioning, obesity, history of asthma, and likely sarcoidosis.  PFTs 03/2022 normal.  Pulmonary sarcoidosis: Based on 2016 supraclavicular left lymph node biopsy with granulomas, persistent stable hilar and mediastinal lymphadenopathy as well as persistent bilateral nodular opacities.  While there is some level activity given nodules, no worsening over time to suggest significantly active disease.  Most recent CT 09/2021 compared to 2021 with stable nodules.  Will continue treatment of dyspnea and sarcoidosis/cough with ICS/LABA via high-dose Symbicort.  No role for systemic therapy given lack of symptoms, normal PFTs, stable imaging.  Cough: Suspect related to asthma versus sarcoidosis.  Improved with Symbicort.   Return in about 6 months (around 09/17/2022).   Lanier Clam, MD 03/18/2022

## 2022-08-06 ENCOUNTER — Ambulatory Visit: Payer: Self-pay | Admitting: Licensed Clinical Social Worker

## 2022-08-06 NOTE — Patient Outreach (Signed)
  Care Coordination   08/06/2022 Name: Melissa Richards MRN: [id number] DOB: 12-06-1965   Care Coordination Outreach Attempts:  An unsuccessful telephone outreach was attempted today to offer the patient information about available care coordination services as a benefit of their health plan.   Follow Up Plan:  Additional outreach attempts will be made to offer the patient care coordination information and services.   Encounter Outcome:  No Answer  Care Coordination Interventions Activated:  No   Care Coordination Interventions:  No, not indicated    Milus Height, BSW, MSW, [place]  Social Worker [facility] Care Management  [phone]

## 2022-08-12 ENCOUNTER — Telehealth: Payer: Self-pay

## 2022-08-12 NOTE — Patient Outreach (Signed)
  Care Coordination   08/12/2022 Name: Melissa Richards MRN: [id number] DOB: 24-Dec-1965   Care Coordination Outreach Attempts:  A second unsuccessful outreach was attempted today to offer the patient with information about available care coordination services as a benefit of their health plan.     Follow Up Plan:  Additional outreach attempts will be made to offer the patient care coordination information and services.   Encounter Outcome:  No Answer  Care Coordination Interventions Activated:  No   Care Coordination Interventions:  No, not indicated    Tomasa Rand, RN, BSN, CEN [facility] [employer] [phone]

## 2022-08-13 ENCOUNTER — Telehealth: Payer: Self-pay

## 2022-08-13 NOTE — Patient Outreach (Signed)
  Care Coordination   08/13/2022 Name: Melissa Richards MRN: [id number] DOB: December 06, 1965   Care Coordination Outreach Attempts:  A third unsuccessful outreach was attempted today to offer the patient with information about available care coordination services as a benefit of their health plan.   Follow Up Plan:  No further outreach attempts will be made at this time. We have been unable to contact the patient to offer or enroll patient in care coordination services  Encounter Outcome:  No Answer  Care Coordination Interventions Activated:  No   Care Coordination Interventions:  No, not indicated    Tomasa Rand, RN, BSN, CEN [place] Coordinator [phone]

## 2022-08-25 DIAGNOSIS — H4041X3 Glaucoma secondary to eye inflammation, right eye, severe stage: Secondary | ICD-10-CM | POA: Diagnosis not present

## 2022-11-15 DIAGNOSIS — K5732 Diverticulitis of large intestine without perforation or abscess without bleeding: Secondary | ICD-10-CM | POA: Diagnosis not present

## 2022-11-15 DIAGNOSIS — D3910 Neoplasm of uncertain behavior of unspecified ovary: Secondary | ICD-10-CM | POA: Diagnosis not present

## 2022-11-15 DIAGNOSIS — G4733 Obstructive sleep apnea (adult) (pediatric): Secondary | ICD-10-CM | POA: Diagnosis not present

## 2022-11-15 DIAGNOSIS — R590 Localized enlarged lymph nodes: Secondary | ICD-10-CM | POA: Diagnosis not present

## 2022-11-15 DIAGNOSIS — I10 Essential (primary) hypertension: Secondary | ICD-10-CM | POA: Diagnosis not present

## 2022-11-15 DIAGNOSIS — I25119 Atherosclerotic heart disease of native coronary artery with unspecified angina pectoris: Secondary | ICD-10-CM | POA: Diagnosis not present

## 2022-11-15 DIAGNOSIS — E119 Type 2 diabetes mellitus without complications: Secondary | ICD-10-CM | POA: Diagnosis not present

## 2022-11-15 DIAGNOSIS — R918 Other nonspecific abnormal finding of lung field: Secondary | ICD-10-CM | POA: Diagnosis not present

## 2022-11-15 DIAGNOSIS — Z7984 Long term (current) use of oral hypoglycemic drugs: Secondary | ICD-10-CM | POA: Diagnosis not present

## 2022-11-15 DIAGNOSIS — D869 Sarcoidosis, unspecified: Secondary | ICD-10-CM | POA: Diagnosis not present

## 2022-11-15 DIAGNOSIS — C569 Malignant neoplasm of unspecified ovary: Secondary | ICD-10-CM | POA: Diagnosis not present

## 2022-12-22 DIAGNOSIS — H4041X3 Glaucoma secondary to eye inflammation, right eye, severe stage: Secondary | ICD-10-CM | POA: Diagnosis not present

## 2023-01-14 DIAGNOSIS — Z6841 Body Mass Index (BMI) 40.0 and over, adult: Secondary | ICD-10-CM | POA: Diagnosis not present

## 2023-01-14 DIAGNOSIS — F339 Major depressive disorder, recurrent, unspecified: Secondary | ICD-10-CM | POA: Diagnosis not present

## 2023-01-14 DIAGNOSIS — E1165 Type 2 diabetes mellitus with hyperglycemia: Secondary | ICD-10-CM | POA: Diagnosis not present

## 2023-01-18 ENCOUNTER — Other Ambulatory Visit: Payer: Self-pay

## 2023-01-18 DIAGNOSIS — I10 Essential (primary) hypertension: Secondary | ICD-10-CM | POA: Insufficient documentation

## 2023-01-24 ENCOUNTER — Encounter: Payer: Self-pay | Admitting: Cardiology

## 2023-01-24 ENCOUNTER — Ambulatory Visit: Payer: BC Managed Care – PPO | Attending: Cardiology | Admitting: Cardiology

## 2023-01-24 VITALS — BP 160/104 | HR 66 | Ht 62.0 in | Wt 250.4 lb

## 2023-01-24 DIAGNOSIS — Z6841 Body Mass Index (BMI) 40.0 and over, adult: Secondary | ICD-10-CM | POA: Diagnosis not present

## 2023-01-24 DIAGNOSIS — E782 Mixed hyperlipidemia: Secondary | ICD-10-CM

## 2023-01-24 DIAGNOSIS — I2510 Atherosclerotic heart disease of native coronary artery without angina pectoris: Secondary | ICD-10-CM | POA: Diagnosis not present

## 2023-01-24 DIAGNOSIS — I10 Essential (primary) hypertension: Secondary | ICD-10-CM

## 2023-01-24 DIAGNOSIS — E088 Diabetes mellitus due to underlying condition with unspecified complications: Secondary | ICD-10-CM | POA: Diagnosis not present

## 2023-01-24 NOTE — Progress Notes (Signed)
Cardiology Office Note:    Date:  01/24/2023   ID:  Melissa Richards, DOB 07/23/1966, MRN [id number]  PCP:  Buckner Malta, MD  Cardiologist:  Garwin Brothers, MD   Referring MD: Buckner Malta, MD    ASSESSMENT:    1. Atherosclerosis of native coronary artery of native heart without angina pectoris   2. Essential (primary) hypertension   3. Diabetes mellitus due to underlying condition with unspecified complications   4. BMI 45.0-49.9, adult   5. Morbid obesity   6. Mixed dyslipidemia    PLAN:    In order of problems listed above:  Elevated calcium score: Primary prevention stressed with the patient.  Importance of compliance with diet medication stressed and she vocalized understanding.  She was advised to walk at least half an hour a day 5 days a week and she promises to do so.   Essential hypertension: Blood pressure stable and diet was emphasized.  Lifestyle modification urged.  She has an element of whitecoat hypertension and she will keep a track of blood pressures at home. Mixed dyslipidemia: Lipids are elevated and I discussed this with her and diet emphasized.  She tells me that she is going to get follow-up with primary care in June.  Goal LDL must be less than 60 Morbid obesity: Weight reduction stressed diet emphasized and she promises to do better with Patient will be seen in follow-up appointment in 6 months or earlier if the patient has any concerns.    Medication Adjustments/Labs and Tests Ordered: Current medicines are reviewed at length with the patient today.  Concerns regarding medicines are outlined above.  No orders of the defined types were placed in this encounter.  No orders of the defined types were placed in this encounter.    No chief complaint on file.    History of Present Illness:    Melissa Richards is a 57 y.o. female.  Patient has past medical history of elevated calcium score, essential hypertension, mixed dyslipidemia, diabetes mellitus  and morbid obesity.  She leads a sedentary lifestyle.  She denies any chest pain orthopnea or PND.  At the time of my evaluation, the patient is alert awake oriented and in no distress.  Past Medical History:  Diagnosis Date   Adnexal mass    BILATERAL   Angina pectoris 04/29/2020   Anxiety    Arthritis    Asthma    Asthma, mild intermittent 08/12/2015   Atypical chest pain 08/10/2018   BMI 45.0-49.9, adult 08/12/2015   Constipation    Coronary atherosclerosis 07/30/2020   Depression    Diabetes mellitus due to underlying condition with unspecified complications 04/29/2020   Diabetes mellitus without complication    Elevated CA-125 07/28/2015   Essential (primary) hypertension 07/28/2015   Family history of adverse reaction to anesthesia    FAMILY MEMBERS ARE SLOW TO WAKE UP   History of colonic polyps 03/17/2020   HTN (hypertension) 07/28/2015   Hypertension    Mass, ovarian    Mixed dyslipidemia 07/30/2020   Morbid obesity 07/30/2020   Morbid obesity with body mass index (BMI) of 40.0 to 44.9 in adult 07/28/2015   Numbness and tingling    FEET /LEGS   Obstructive sleep apnea 07/28/2015   ovarian ca dx'd 07/2015   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   Ovarian mass, left 07/28/2015   Pelvic mass in female 09/04/2015   Recurrent UTI    Sarcoidosis 08/10/2018   Shortness of breath 07/28/2015   Sleep  apnea    DOES NOT USE C -PAP   Special screening examination for viral disease 03/17/2020   Supraclavicular adenopathy    Type II diabetes mellitus 07/28/2015    Past Surgical History:  Procedure Laterality Date   ABDOMINAL HYSTERECTOMY Bilateral 09/04/2015   Procedure: HYSTERECTOMY TOTAL ABDOMINAL, EXPLORATORY LAPAROTOMY, BILATERAL SALPINGO OOPHERECTOMY, OMENTECTOMY, DEBULKING;  Surgeon: Adolphus Birchwood, MD;  Location: [facility];  Service: Gynecology;  Laterality: Bilateral;   CARDIAC CATHETERIZATION     2 years ago   TONSILLECTOMY     TUBAL LIGATION      Current  Medications: Current Meds  Medication Sig   aspirin EC 81 MG tablet Take 81 mg by mouth daily. Swallow whole.   atenolol (TENORMIN) 50 MG tablet Take 1 tablet (50 mg total) by mouth every morning.   atorvastatin (LIPITOR) 10 MG tablet TAKE 1 TABLET BY MOUTH EVERY DAY   budesonide-formoterol (SYMBICORT) 160-4.5 MCG/ACT inhaler Inhale 2 puffs into the lungs in the morning and at bedtime.   busPIRone (BUSPAR) 15 MG tablet Take 15 mg by mouth 3 (three) times daily.   dorzolamide-timolol (COSOPT) 22.3-6.8 MG/ML ophthalmic solution Place 1 drop into the right eye 2 (two) times daily.   latanoprost (XALATAN) 0.005 % ophthalmic solution Place 1 drop into the right eye at bedtime.   lisinopril (PRINIVIL,ZESTRIL) 20 MG tablet Take 20 mg by mouth daily.   LUMIGAN 0.01 % SOLN Place 1 drop into the right eye at bedtime.   metFORMIN (GLUCOPHAGE) 500 MG tablet Take 500 mg by mouth 2 (two) times daily with a meal.   nitroGLYCERIN (NITROSTAT) 0.4 MG SL tablet Place 1 tablet (0.4 mg total) under the tongue every 5 (five) minutes as needed for chest pain.   sertraline (ZOLOFT) 50 MG tablet Take 50 mg by mouth daily.   traZODone (DESYREL) 100 MG tablet Take 50 mg by mouth daily.   triamterene-hydrochlorothiazide (MAXZIDE-25) 37.5-25 MG tablet Take 1 tablet by mouth daily.     Allergies:   Hydrocodone   Social History   Socioeconomic History   Marital status: Married    Spouse name: Not on file   Number of children: Not on file   Years of education: Not on file   Highest education level: Not on file  Occupational History   Not on file  Tobacco Use   Smoking status: Never   Smokeless tobacco: Never  Vaping Use   Vaping Use: Never used  Substance and Sexual Activity   Alcohol use: No   Drug use: No   Sexual activity: Not on file  Other Topics Concern   Not on file  Social History Narrative   Not on file   Social Determinants of Health   Financial Resource Strain: Not on file  Food  Insecurity: Not on file  Transportation Needs: Not on file  Physical Activity: Not on file  Stress: Not on file  Social Connections: Not on file     Family History: The patient's family history includes Anesthesia problems in her mother; Cancer in her father; Diabetes in her brother and mother; Hypertension in her mother.  ROS:   Please see the history of present illness.    All other systems reviewed and are negative.  EKGs/Labs/Other Studies Reviewed:    The following studies were reviewed today: I discussed my findings with the patient at length   Recent Labs: No results found for requested labs within last 365 days.  Recent Lipid Panel    Component Value Date/Time  CHOL 193 07/29/2021 0951   TRIG 211 (H) 07/29/2021 0951   HDL 37 (L) 07/29/2021 0951   CHOLHDL 5.2 (H) 07/29/2021 0951   LDLCALC 119 (H) 07/29/2021 0951    Physical Exam:    VS:  BP (!) 160/104   Pulse 66   Ht 5\' 2"  (1.575 m)   Wt 250 lb 6.4 oz (113.6 kg)   SpO2 95%   BMI 45.80 kg/m     Wt Readings from Last 3 Encounters:  01/24/23 250 lb 6.4 oz (113.6 kg)  03/18/22 249 lb (112.9 kg)  01/27/22 248 lb (112.5 kg)     GEN: Patient is in no acute distress HEENT: Normal NECK: No JVD; No carotid bruits LYMPHATICS: No lymphadenopathy CARDIAC: Hear sounds regular, 2/6 systolic murmur at the apex. RESPIRATORY:  Clear to auscultation without rales, wheezing or rhonchi  ABDOMEN: Soft, non-tender, non-distended MUSCULOSKELETAL:  No edema; No deformity  SKIN: Warm and dry NEUROLOGIC:  Alert and oriented x 3 PSYCHIATRIC:  Normal affect   Signed, Garwin Brothers, MD  01/24/2023 1:37 PM    [facility]

## 2023-01-24 NOTE — Patient Instructions (Signed)
Medication Instructions:  Your physician recommends that you continue on your current medications as directed. Please refer to the Current Medication list given to you today.  *If you need a refill on your cardiac medications before your next appointment, please call your pharmacy*   Lab Work: None ordered If you have labs (blood work) drawn today and your tests are completely normal, you will receive your results only by: MyChart Message (if you have MyChart) OR A paper copy in the mail If you have any lab test that is abnormal or we need to change your treatment, we will call you to review the results.   Testing/Procedures: None ordered   Follow-Up: At [facility], you and your health needs are our priority.  As part of our continuing mission to provide you with exceptional heart care, we have created designated Provider Care Teams.  These Care Teams include your primary Cardiologist (physician) and Advanced Practice Providers (APPs -  Physician Assistants and Nurse Practitioners) who all work together to provide you with the care you need, when you need it.  We recommend signing up for the patient portal called "MyChart".  Sign up information is provided on this After Visit Summary.  MyChart is used to connect with patients for Virtual Visits (Telemedicine).  Patients are able to view lab/test results, encounter notes, upcoming appointments, etc.  Non-urgent messages can be sent to your provider as well.   To learn more about what you can do with MyChart, go to [email or web address].    Your next appointment:   9 month(s)  The format for your next appointment:   In Person  Provider:   Rajan Revankar, MD    Other Instructions none  Important Information About [facility]       

## 2023-01-31 ENCOUNTER — Ambulatory Visit: Payer: BC Managed Care – PPO | Admitting: Pulmonary Disease

## 2023-01-31 ENCOUNTER — Encounter: Payer: Self-pay | Admitting: Pulmonary Disease

## 2023-01-31 VITALS — BP 126/70 | HR 66 | Temp 97.7°F | Ht 62.0 in | Wt 252.6 lb

## 2023-01-31 DIAGNOSIS — J4520 Mild intermittent asthma, uncomplicated: Secondary | ICD-10-CM | POA: Diagnosis not present

## 2023-01-31 MED ORDER — BREZTRI AEROSPHERE 160-9-4.8 MCG/ACT IN AERO: 2.0000 | INHALATION_SPRAY | Freq: Two times a day (BID) | RESPIRATORY_TRACT | 11 refills | Status: AC

## 2023-01-31 MED ORDER — BREZTRI AEROSPHERE 160-9-4.8 MCG/ACT IN AERO: 2.0000 | INHALATION_SPRAY | Freq: Two times a day (BID) | RESPIRATORY_TRACT | 0 refills | Status: AC

## 2023-01-31 NOTE — Progress Notes (Signed)
@Patient  ID: Melissa Richards, female    DOB: 06-19-1966, 57 y.o.   MRN: [id number]  Chief Complaint  Patient presents with   Follow-up    Asthma follow up, states DOE, cough with thick, clear phlegm at times    Referring provider: Buckner Malta, MD  HPI:   57 y.o. woman whom we are seeing in follow-up for evaluation of sarcoidosis and cough.  Most recent oncology note via [facility] system reviewed.  Last seen over June 2023.  Maintained on high-dose Symbicort 2 puff twice daily.  Overall dyspnea worsening over time.  Cough relatively stable but occasionally productive of thick phlegm.  Had CT scan 11/2022 for surveillance via oncology.  This showed stable scattered nodules, stable lymphadenopathy compared to prior per report/radiology read.  Discussed repeating spirometry to evaluate her dyspnea on exertion.  Stable parenchyma is relatively reassuring which was  shared with her.   PFT performed at last visit 03/2022 reviewed with patient in detail.  Interpreted as normal spirometry, no bronchodilator response, normal lung volumes, DLCO within normal limits.  HPI at initial visit Patient notes a several year history of dyspnea on exertion.  May be worsening over the last several months to year.  Worse with inclines or stairs but present on flat surfaces as well.  Has good days and bad days.  Not initially worse with change in season of pollens.  No timing during the day with things are better or worse.  No positional changes that make things better or worse.  Does not report any change in environment in terms of new house, remodeling, flooding etc. that may contribute to symptoms.  She does use albuterol and this seems to help intermittently with her shortness of breath.  She has been prescribed Symbicort but does not pick it up or using it.  Was using it at 1 time as needed and found to be somewhat helpful.  Reviewed serial CT scans most recent coronary CT 05/2020 on my review demonstrates stable  nodular densities in bilateral lungs compared to CT chest in 2016 that I reviewed interpreted as the same.  She had a left supraclavicular lymph node biopsy 08/05/2015 that revealed focal cluster of given granulomas with lymphoid tissue present no malignant cells.  Cytology results reviewed.  TTE 09/2018 reviewed with mild mitral valve regurgitation, normal LA size, normal EF, unable to comment on diastolic function.  She reports routine eye checks with her eye care provider.  PMH: Hypertension, asthma, diabetes, seasonal allergies, OSA, ovarian cancer status post surgical resection 09/04/2015 Surgical history: Hysterectomy Family history: She reports first-degree relatives with emphysema and asthma as well as coronary artery disease Social history: Never smoker, lives with husband, lives in [place] / Pulmonary Flowsheets:   ACT:      No data to display           MMRC:     No data to display           Epworth:      No data to display           Tests:   FENO:  No results found for: "NITRICOXIDE"  PFT:    Latest Ref Rng & Units 03/18/2022   10:33 AM  PFT Results  FVC-Pre L 2.32   FVC-Predicted Pre % 74   FVC-Post L 2.40   FVC-Predicted Post % 76   Pre FEV1/FVC % % 85   Post FEV1/FCV % % 85   FEV1-Pre L 1.97  FEV1-Predicted Pre % 81   FEV1-Post L 2.04   DLCO uncorrected ml/min/mmHg 19.79   DLCO UNC% % 103   DLCO corrected ml/min/mmHg 19.79   DLCO COR %Predicted % 103   DLVA Predicted % 128   TLC L 4.13   TLC % Predicted % 86   RV % Predicted % 95   Personally reviewed and Interpreted as normal spirometry, no bronchodilator spots, normal lung volumes, DLCO within normal limits.  WALK:      No data to display           Imaging: Personally reviewed and as per EMR discussion this note  Lab Results: Personally reviewed, no anemia, eosinophils elevated to 500 07/2020 on most recent check CBC    Component Value Date/Time    WBC 7.1 07/29/2021 0951   WBC 8.7 05/18/2017 1417   WBC 12.2 (H) 09/06/2015 1050   RBC 5.05 07/29/2021 0951   RBC 4.85 05/18/2017 1417   RBC 3.27 (L) 09/06/2015 1050   HGB 15.1 07/29/2021 0951   HGB 14.2 05/18/2017 1417   HCT 44.6 07/29/2021 0951   HCT 42.4 05/18/2017 1417   PLT 213 07/29/2021 0951   MCV 88 07/29/2021 0951   MCV 87.4 05/18/2017 1417   MCH 29.9 07/29/2021 0951   MCH 29.2 05/18/2017 1417   MCH 25.4 (L) 09/06/2015 1050   MCHC 33.9 07/29/2021 0951   MCHC 33.4 05/18/2017 1417   MCHC 30.9 09/06/2015 1050   RDW 11.9 07/29/2021 0951   RDW 13.0 05/18/2017 1417   LYMPHSABS 1.1 07/29/2021 0951   LYMPHSABS 1.3 05/18/2017 1417   MONOABS 0.8 05/18/2017 1417   EOSABS 0.5 (H) 07/29/2021 0951   BASOSABS 0.1 07/29/2021 0951   BASOSABS 0.1 05/18/2017 1417    BMET    Component Value Date/Time   NA 138 07/29/2021 0951   NA 138 05/18/2017 1417   K 3.9 07/29/2021 0951   K 3.9 05/18/2017 1417   CL 99 07/29/2021 0951   CO2 25 07/29/2021 0951   CO2 26 05/18/2017 1417   GLUCOSE 118 (H) 07/29/2021 0951   GLUCOSE 212 (H) 10/02/2020 1331   GLUCOSE 205 (H) 05/18/2017 1417   BUN 12 07/29/2021 0951   BUN 11.4 05/18/2017 1417   CREATININE 0.87 07/29/2021 0951   CREATININE 0.96 10/13/2018 1023   CREATININE 0.9 05/18/2017 1417   CALCIUM 9.7 07/29/2021 0951   CALCIUM 9.7 05/18/2017 1417   GFRNONAA >60 10/02/2020 1331   GFRNONAA >60 10/13/2018 1023   GFRAA 72 07/30/2020 1143   GFRAA >60 10/13/2018 1023    BNP No results found for: "BNP"  ProBNP No results found for: "PROBNP"  Specialty Problems       Pulmonary Problems   Asthma   Obstructive sleep apnea   Shortness of breath   Asthma, mild intermittent   Sleep apnea    DOES NOT USE C -PAP       Allergies  Allergen Reactions   Hydrocodone Itching    Can only take w benadryl    Immunization History  Administered Date(s) Administered   Influenza,inj,Quad PF,6+ Mos 08/12/2015    Past Medical History:   Diagnosis Date   Adnexal mass    BILATERAL   Angina pectoris (HCC) 04/29/2020   Anxiety    Arthritis    Asthma    Asthma, mild intermittent 08/12/2015   Atypical chest pain 08/10/2018   BMI 45.0-49.9, adult (HCC) 08/12/2015   Constipation    Coronary atherosclerosis 07/30/2020   Depression  Diabetes mellitus due to underlying condition with unspecified complications (HCC) 04/29/2020   Diabetes mellitus without complication (HCC)    Elevated CA-125 07/28/2015   Essential (primary) hypertension 07/28/2015   Family history of adverse reaction to anesthesia    FAMILY MEMBERS ARE SLOW TO WAKE UP   History of colonic polyps 03/17/2020   HTN (hypertension) 07/28/2015   Hypertension    Mass, ovarian    Mixed dyslipidemia 07/30/2020   Morbid obesity (HCC) 07/30/2020   Morbid obesity with body mass index (BMI) of 40.0 to 44.9 in adult (HCC) 07/28/2015   Numbness and tingling    FEET /LEGS   Obstructive sleep apnea 07/28/2015   ovarian ca dx'd 07/2015   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 Swallow whole.   atenolol (TENORMIN) 50 MG tablet Take 1 tablet (50 mg total) by mouth every morning.   atorvastatin (LIPITOR) 10 MG tablet TAKE 1 TABLET BY MOUTH EVERY DAY   Budeson-Glycopyrrol-Formoterol (BREZTRI AEROSPHERE) 160-9-4.8 MCG/ACT AERO Inhale 2 puffs into the lungs in the morning and at  bedtime.   Budeson-Glycopyrrol-Formoterol (BREZTRI AEROSPHERE) 160-9-4.8 MCG/ACT AERO Inhale 2 puffs into the lungs in the morning and at bedtime.   budesonide-formoterol (SYMBICORT) 160-4.5 MCG/ACT inhaler Inhale 2 puffs into the lungs in the morning and at bedtime.   busPIRone (BUSPAR) 15 MG tablet Take 15 mg by mouth 3 (three) times daily.   dorzolamide-timolol (COSOPT) 22.3-6.8 MG/ML ophthalmic solution Place 1 drop into the right eye 2 (two) times daily.   latanoprost (XALATAN) 0.005 % ophthalmic solution Place 1 drop into the right eye at bedtime.   lisinopril (PRINIVIL,ZESTRIL) 20 MG tablet Take 20 mg by mouth daily.   LUMIGAN 0.01 % SOLN Place 1 drop into the right eye at bedtime.   metFORMIN (GLUCOPHAGE) 500 MG tablet Take 500 mg by mouth 2 (two) times daily with a meal.   sertraline (ZOLOFT) 50 MG tablet Take 50 mg by mouth daily.   traZODone (DESYREL) 100 MG tablet Take 50 mg by mouth daily.   triamterene-hydrochlorothiazide (MAXZIDE-25) 37.5-25 MG tablet Take 1 tablet by mouth daily.   nitroGLYCERIN (NITROSTAT) 0.4 MG SL tablet Place 1 tablet (0.4 mg total) under the tongue every 5 (five) minutes as needed for chest pain. (Patient not taking: Reported on 01/31/2023)   No facility-administered encounter medications on file as of 01/31/2023.     Review of Systems  Review of Systems  N/a Physical Exam  BP 126/70 (BP Location: Right Arm, Patient Position: Sitting, Cuff Size: Large)   Pulse 66   Temp 97.7 F (36.5 C) (Oral)   Ht 5\' 2"  (1.575 m)   Wt 252 lb 9.6 oz (114.6 kg)   SpO2 97% Comment: RA  BMI 46.20 kg/m   Wt Readings from Last 5 Encounters:  01/31/23 252 lb 9.6 oz (114.6 kg)  01/24/23 250 lb 6.4 oz (113.6 kg)  03/18/22 249 lb (112.9 kg)  01/27/22 248 lb (112.5 kg)  07/29/21 239 lb 3.2 oz (108.5 kg)    BMI Readings from Last 5 Encounters:  01/31/23 46.20 kg/m  01/24/23 45.80 kg/m  03/18/22 45.54 kg/m  01/27/22 45.36 kg/m  07/29/21 43.75 kg/m      Physical Exam General: Well-appearing, no acute distress Eyes: EOMI, no icterus Neck: Supple, no JVP appreciated Cardiovascular: Regular rate and rhythm, no murmur Pulmonary: Clear to auscultation bilaterally, normal work of breathing on room air Abdomen: Nondistended, bowel sounds present MSK: No synovitis, joint effusion Neuro: Normal gait, no weakness Psych: Normal mood, full affect   Assessment & Plan:   Dyspnea on exertion: Likely multifactorial and related to deconditioning, obesity, history of asthma, and likely sarcoidosis.  PFTs 03/2022 normal.  Possible asthma.  Escalate to high-dose Symbicort to [place].  Warning signs for eye pain and worsening vision given her history of glaucoma were discussed and instructions for discontinuation were discussed.  Pulmonary sarcoidosis: Based on 2016 supraclavicular left lymph node biopsy with granulomas, persistent stable hilar and mediastinal lymphadenopathy as well as persistent bilateral nodular opacities.  While there is some level activity given nodules, no worsening over time to suggest significantly active disease.  Most recent CT 11/2022 compared to 09/2021 compared to 2021 with stable nodules.  Will escalate treatment of dyspnea and sarcoidosis/cough with ICS/LABA via Breztri.  No role for systemic therapy given lack of symptoms, normal PFTs, stable imaging.  Repeat PFTs yearly, next 03/2023.  Cough: Suspect related to asthma versus sarcoidosis.  Improved with Symbicort.   Return in about 3 months (around 05/02/2023).   Karren Burly, MD 01/31/2023

## 2023-01-31 NOTE — Patient Instructions (Addendum)
Nice to see you again  For the shortness of breath, this could be related to asthma.  As such I like to try a stronger inhaler  Stop Symbicort, start Breztri 2 puffs twice a day every day.  Rinse your mouth out with water after every use.  Use this just like you did your Symbicort.  If you notice any eye pain or vision changes, please stop the [facility] and resume the Symbicort.  See me a message if this happens.  Use the sample I provided to make sure it does not cause any side effects.  I also sent a prescription to the local pharmacy so you can pick up if you find that you do okay or hopefully do better with the [facility]  I would hope it would help your shortness of breath and cough some.  I like to order pulmonary function test to further evaluate your symptoms, with sarcoid I think it is once a year anyway.  Your recent CT scan 11/2022 looks unchanged based on the read from the radiologist.  I will request we get these images so I can review them myself.  Return to clinic in 3 months or sooner as needed with Dr. Judeth Horn

## 2023-03-08 DIAGNOSIS — L308 Other specified dermatitis: Secondary | ICD-10-CM | POA: Diagnosis not present

## 2023-03-08 DIAGNOSIS — D485 Neoplasm of uncertain behavior of skin: Secondary | ICD-10-CM | POA: Diagnosis not present

## 2023-03-08 DIAGNOSIS — L814 Other melanin hyperpigmentation: Secondary | ICD-10-CM | POA: Diagnosis not present

## 2023-03-18 DIAGNOSIS — Z131 Encounter for screening for diabetes mellitus: Secondary | ICD-10-CM | POA: Diagnosis not present

## 2023-03-18 DIAGNOSIS — Z0000 Encounter for general adult medical examination without abnormal findings: Secondary | ICD-10-CM | POA: Diagnosis not present

## 2023-03-18 DIAGNOSIS — E538 Deficiency of other specified B group vitamins: Secondary | ICD-10-CM | POA: Diagnosis not present

## 2023-03-18 DIAGNOSIS — E1122 Type 2 diabetes mellitus with diabetic chronic kidney disease: Secondary | ICD-10-CM | POA: Diagnosis not present

## 2023-03-18 DIAGNOSIS — Z1331 Encounter for screening for depression: Secondary | ICD-10-CM | POA: Diagnosis not present

## 2023-03-19 LAB — LAB REPORT - SCANNED
A1c: 7.1
Albumin, Urine POC: 4
Creatinine, POC: 111.5 mg/dL
EGFR: 60
Microalb Creat Ratio: 4

## 2023-03-22 ENCOUNTER — Ambulatory Visit (INDEPENDENT_AMBULATORY_CARE_PROVIDER_SITE_OTHER): Payer: BC Managed Care – PPO | Admitting: Pulmonary Disease

## 2023-03-22 DIAGNOSIS — J4520 Mild intermittent asthma, uncomplicated: Secondary | ICD-10-CM

## 2023-03-22 LAB — PULMONARY FUNCTION TEST
DL/VA % pred: 134 %
DL/VA: 5.78 ml/min/mmHg/L
DLCO cor % pred: 97 %
DLCO cor: 18.51 ml/min/mmHg
DLCO unc % pred: 97 %
DLCO unc: 18.51 ml/min/mmHg
FEF 25-75 Post: 2.08 L/sec
FEF 25-75 Pre: 1.58 L/sec
FEF2575-%Change-Post: 31 %
FEF2575-%Pred-Post: 91 %
FEF2575-%Pred-Pre: 69 %
FEV1-%Change-Post: 11 %
FEV1-%Pred-Post: 74 %
FEV1-%Pred-Pre: 66 %
FEV1-Post: 1.78 L
FEV1-Pre: 1.59 L
FEV1FVC-%Change-Post: 3 %
FEV1FVC-%Pred-Pre: 103 %
FEV6-%Change-Post: 8 %
FEV6-%Pred-Post: 71 %
FEV6-%Pred-Pre: 65 %
FEV6-Post: 2.13 L
FEV6-Pre: 1.97 L
FEV6FVC-%Pred-Post: 103 %
FEV6FVC-%Pred-Pre: 103 %
FVC-%Change-Post: 8 %
FVC-%Pred-Post: 68 %
FVC-%Pred-Pre: 63 %
FVC-Post: 2.13 L
FVC-Pre: 1.97 L
Post FEV1/FVC ratio: 84 %
Post FEV6/FVC ratio: 100 %
Pre FEV1/FVC ratio: 81 %
Pre FEV6/FVC Ratio: 100 %
RV % pred: 99 %
RV: 1.85 L
TLC % pred: 84 %
TLC: 4.03 L

## 2023-03-22 NOTE — Progress Notes (Signed)
Full PFT performed today. °

## 2023-03-22 NOTE — Patient Instructions (Signed)
Full PFT performed today. °

## 2023-03-23 DIAGNOSIS — Z1231 Encounter for screening mammogram for malignant neoplasm of breast: Secondary | ICD-10-CM | POA: Diagnosis not present

## 2023-04-12 ENCOUNTER — Telehealth: Payer: Self-pay | Admitting: Pulmonary Disease

## 2023-04-12 NOTE — Telephone Encounter (Signed)
Please call PT w/PFT results @ [phone]

## 2023-04-13 NOTE — Telephone Encounter (Signed)
Spoke to pt and I informed her that she has an appt with Dr Judeth Horn on 05/04/23 and she will get her PFT results at time of appt. Pt verbalized understanding. NFN

## 2023-05-04 ENCOUNTER — Ambulatory Visit: Payer: BC Managed Care – PPO | Admitting: Pulmonary Disease

## 2023-05-18 DIAGNOSIS — F339 Major depressive disorder, recurrent, unspecified: Secondary | ICD-10-CM | POA: Diagnosis not present

## 2023-05-18 DIAGNOSIS — R0981 Nasal congestion: Secondary | ICD-10-CM | POA: Diagnosis not present

## 2023-05-18 DIAGNOSIS — J40 Bronchitis, not specified as acute or chronic: Secondary | ICD-10-CM | POA: Diagnosis not present

## 2023-05-18 DIAGNOSIS — J329 Chronic sinusitis, unspecified: Secondary | ICD-10-CM | POA: Diagnosis not present

## 2023-06-14 ENCOUNTER — Encounter: Payer: Self-pay | Admitting: Gynecologic Oncology

## 2023-08-05 DIAGNOSIS — H4041X3 Glaucoma secondary to eye inflammation, right eye, severe stage: Secondary | ICD-10-CM | POA: Diagnosis not present

## 2023-10-13 DIAGNOSIS — L03311 Cellulitis of abdominal wall: Secondary | ICD-10-CM | POA: Diagnosis not present

## 2023-10-13 DIAGNOSIS — D863 Sarcoidosis of skin: Secondary | ICD-10-CM | POA: Diagnosis not present

## 2023-10-13 DIAGNOSIS — E1169 Type 2 diabetes mellitus with other specified complication: Secondary | ICD-10-CM | POA: Diagnosis not present

## 2023-11-09 DIAGNOSIS — D863 Sarcoidosis of skin: Secondary | ICD-10-CM | POA: Diagnosis not present

## 2023-11-09 DIAGNOSIS — M62838 Other muscle spasm: Secondary | ICD-10-CM | POA: Diagnosis not present

## 2023-11-09 DIAGNOSIS — M7062 Trochanteric bursitis, left hip: Secondary | ICD-10-CM | POA: Diagnosis not present

## 2023-11-09 DIAGNOSIS — Z6841 Body Mass Index (BMI) 40.0 and over, adult: Secondary | ICD-10-CM | POA: Diagnosis not present

## 2023-11-21 DIAGNOSIS — D863 Sarcoidosis of skin: Secondary | ICD-10-CM | POA: Diagnosis not present

## 2023-11-21 DIAGNOSIS — L739 Follicular disorder, unspecified: Secondary | ICD-10-CM | POA: Diagnosis not present

## 2023-11-21 DIAGNOSIS — D485 Neoplasm of uncertain behavior of skin: Secondary | ICD-10-CM | POA: Diagnosis not present

## 2023-11-21 DIAGNOSIS — L0390 Cellulitis, unspecified: Secondary | ICD-10-CM | POA: Diagnosis not present

## 2023-11-30 DIAGNOSIS — L0102 Bockhart's impetigo: Secondary | ICD-10-CM | POA: Diagnosis not present

## 2023-11-30 DIAGNOSIS — L739 Follicular disorder, unspecified: Secondary | ICD-10-CM | POA: Diagnosis not present

## 2023-11-30 DIAGNOSIS — L0390 Cellulitis, unspecified: Secondary | ICD-10-CM | POA: Diagnosis not present

## 2023-11-30 DIAGNOSIS — B9562 Methicillin resistant Staphylococcus aureus infection as the cause of diseases classified elsewhere: Secondary | ICD-10-CM | POA: Diagnosis not present

## 2023-12-05 DIAGNOSIS — R918 Other nonspecific abnormal finding of lung field: Secondary | ICD-10-CM | POA: Diagnosis not present

## 2023-12-05 DIAGNOSIS — D869 Sarcoidosis, unspecified: Secondary | ICD-10-CM | POA: Diagnosis not present

## 2023-12-05 DIAGNOSIS — Z7984 Long term (current) use of oral hypoglycemic drugs: Secondary | ICD-10-CM | POA: Diagnosis not present

## 2023-12-05 DIAGNOSIS — D3910 Neoplasm of uncertain behavior of unspecified ovary: Secondary | ICD-10-CM | POA: Diagnosis not present

## 2023-12-05 DIAGNOSIS — H409 Unspecified glaucoma: Secondary | ICD-10-CM | POA: Diagnosis not present

## 2023-12-05 DIAGNOSIS — N189 Chronic kidney disease, unspecified: Secondary | ICD-10-CM | POA: Diagnosis not present

## 2023-12-05 DIAGNOSIS — Z8614 Personal history of Methicillin resistant Staphylococcus aureus infection: Secondary | ICD-10-CM | POA: Diagnosis not present

## 2023-12-05 DIAGNOSIS — E1122 Type 2 diabetes mellitus with diabetic chronic kidney disease: Secondary | ICD-10-CM | POA: Diagnosis not present

## 2023-12-05 DIAGNOSIS — G4733 Obstructive sleep apnea (adult) (pediatric): Secondary | ICD-10-CM | POA: Diagnosis not present

## 2023-12-05 DIAGNOSIS — I2510 Atherosclerotic heart disease of native coronary artery without angina pectoris: Secondary | ICD-10-CM | POA: Diagnosis not present

## 2023-12-05 DIAGNOSIS — C569 Malignant neoplasm of unspecified ovary: Secondary | ICD-10-CM | POA: Diagnosis not present

## 2023-12-05 DIAGNOSIS — I129 Hypertensive chronic kidney disease with stage 1 through stage 4 chronic kidney disease, or unspecified chronic kidney disease: Secondary | ICD-10-CM | POA: Diagnosis not present

## 2023-12-16 DIAGNOSIS — H4041X3 Glaucoma secondary to eye inflammation, right eye, severe stage: Secondary | ICD-10-CM | POA: Diagnosis not present

## 2023-12-27 ENCOUNTER — Ambulatory Visit: Admitting: Cardiology

## 2023-12-27 DIAGNOSIS — L739 Follicular disorder, unspecified: Secondary | ICD-10-CM | POA: Diagnosis not present

## 2023-12-27 DIAGNOSIS — L0102 Bockhart's impetigo: Secondary | ICD-10-CM | POA: Diagnosis not present

## 2023-12-27 DIAGNOSIS — L0390 Cellulitis, unspecified: Secondary | ICD-10-CM | POA: Diagnosis not present

## 2023-12-27 DIAGNOSIS — B9562 Methicillin resistant Staphylococcus aureus infection as the cause of diseases classified elsewhere: Secondary | ICD-10-CM | POA: Diagnosis not present

## 2024-03-22 DIAGNOSIS — Z13220 Encounter for screening for lipoid disorders: Secondary | ICD-10-CM | POA: Diagnosis not present

## 2024-03-22 DIAGNOSIS — Z136 Encounter for screening for cardiovascular disorders: Secondary | ICD-10-CM | POA: Diagnosis not present

## 2024-03-22 DIAGNOSIS — Z131 Encounter for screening for diabetes mellitus: Secondary | ICD-10-CM | POA: Diagnosis not present

## 2024-03-22 DIAGNOSIS — E1169 Type 2 diabetes mellitus with other specified complication: Secondary | ICD-10-CM | POA: Diagnosis not present

## 2024-03-22 DIAGNOSIS — Z01419 Encounter for gynecological examination (general) (routine) without abnormal findings: Secondary | ICD-10-CM | POA: Diagnosis not present

## 2024-03-27 DIAGNOSIS — Z1231 Encounter for screening mammogram for malignant neoplasm of breast: Secondary | ICD-10-CM | POA: Diagnosis not present

## 2024-04-12 DIAGNOSIS — L600 Ingrowing nail: Secondary | ICD-10-CM | POA: Diagnosis not present

## 2024-04-12 DIAGNOSIS — Z6841 Body Mass Index (BMI) 40.0 and over, adult: Secondary | ICD-10-CM | POA: Diagnosis not present

## 2024-04-25 DIAGNOSIS — H4041X3 Glaucoma secondary to eye inflammation, right eye, severe stage: Secondary | ICD-10-CM | POA: Diagnosis not present

## 2024-06-27 DIAGNOSIS — R300 Dysuria: Secondary | ICD-10-CM | POA: Diagnosis not present

## 2024-06-27 DIAGNOSIS — F4323 Adjustment disorder with mixed anxiety and depressed mood: Secondary | ICD-10-CM | POA: Diagnosis not present

## 2024-06-27 DIAGNOSIS — E1169 Type 2 diabetes mellitus with other specified complication: Secondary | ICD-10-CM | POA: Diagnosis not present

## 2024-06-27 DIAGNOSIS — R34 Anuria and oliguria: Secondary | ICD-10-CM | POA: Diagnosis not present

## 2024-07-09 DIAGNOSIS — D869 Sarcoidosis, unspecified: Secondary | ICD-10-CM | POA: Diagnosis not present

## 2024-07-09 DIAGNOSIS — R0609 Other forms of dyspnea: Secondary | ICD-10-CM | POA: Diagnosis not present

## 2024-07-09 DIAGNOSIS — I2510 Atherosclerotic heart disease of native coronary artery without angina pectoris: Secondary | ICD-10-CM | POA: Diagnosis not present

## 2024-07-09 DIAGNOSIS — E782 Mixed hyperlipidemia: Secondary | ICD-10-CM | POA: Diagnosis not present

## 2024-07-10 DIAGNOSIS — I2510 Atherosclerotic heart disease of native coronary artery without angina pectoris: Secondary | ICD-10-CM | POA: Diagnosis not present

## 2024-07-18 DIAGNOSIS — R300 Dysuria: Secondary | ICD-10-CM | POA: Diagnosis not present

## 2024-07-18 DIAGNOSIS — Z6841 Body Mass Index (BMI) 40.0 and over, adult: Secondary | ICD-10-CM | POA: Diagnosis not present

## 2024-07-18 DIAGNOSIS — R0600 Dyspnea, unspecified: Secondary | ICD-10-CM | POA: Diagnosis not present

## 2024-08-15 ENCOUNTER — Encounter: Payer: Self-pay | Admitting: Pulmonary Disease

## 2024-09-14 DIAGNOSIS — H4041X3 Glaucoma secondary to eye inflammation, right eye, severe stage: Secondary | ICD-10-CM | POA: Diagnosis not present
